# Patient Record
Sex: Male | Born: 1945 | Race: White | Hispanic: No | Marital: Married | Smoking: Former smoker
Health system: Southern US, Community
[De-identification: ages and names within clinical notes are randomized; demographics above are authoritative.]

## PROBLEM LIST (undated history)

## (undated) DIAGNOSIS — F419 Anxiety disorder, unspecified: Secondary | ICD-10-CM

## (undated) DIAGNOSIS — I2510 Atherosclerotic heart disease of native coronary artery without angina pectoris: Secondary | ICD-10-CM

## (undated) DIAGNOSIS — G8929 Other chronic pain: Secondary | ICD-10-CM

## (undated) DIAGNOSIS — I10 Essential (primary) hypertension: Secondary | ICD-10-CM

## (undated) DIAGNOSIS — G4730 Sleep apnea, unspecified: Secondary | ICD-10-CM

## (undated) DIAGNOSIS — I219 Acute myocardial infarction, unspecified: Secondary | ICD-10-CM

## (undated) DIAGNOSIS — M545 Low back pain, unspecified: Secondary | ICD-10-CM

## (undated) DIAGNOSIS — Z87442 Personal history of urinary calculi: Secondary | ICD-10-CM

## (undated) DIAGNOSIS — Z9981 Dependence on supplemental oxygen: Secondary | ICD-10-CM

## (undated) DIAGNOSIS — M170 Bilateral primary osteoarthritis of knee: Secondary | ICD-10-CM

## (undated) DIAGNOSIS — F418 Other specified anxiety disorders: Secondary | ICD-10-CM

## (undated) DIAGNOSIS — F32A Depression, unspecified: Secondary | ICD-10-CM

## (undated) DIAGNOSIS — I25810 Atherosclerosis of coronary artery bypass graft(s) without angina pectoris: Secondary | ICD-10-CM

## (undated) DIAGNOSIS — E7800 Pure hypercholesterolemia, unspecified: Secondary | ICD-10-CM

## (undated) DIAGNOSIS — F0390 Unspecified dementia without behavioral disturbance: Secondary | ICD-10-CM

## (undated) DIAGNOSIS — F329 Major depressive disorder, single episode, unspecified: Secondary | ICD-10-CM

## (undated) DIAGNOSIS — I639 Cerebral infarction, unspecified: Secondary | ICD-10-CM

## (undated) DIAGNOSIS — N189 Chronic kidney disease, unspecified: Secondary | ICD-10-CM

## (undated) HISTORY — PX: KIDNEY STONE SURGERY: SHX686

## (undated) HISTORY — DX: Atherosclerosis of coronary artery bypass graft(s) without angina pectoris: I25.810

## (undated) HISTORY — PX: WRIST SURGERY: SHX841

## (undated) HISTORY — PX: CAROTID ENDARTERECTOMY: SUR193

## (undated) HISTORY — PX: FRACTURE SURGERY: SHX138

## (undated) HISTORY — PX: CARDIAC CATHETERIZATION: SHX172

## (undated) HISTORY — DX: Cerebral infarction, unspecified: I63.9

---

## 1983-04-12 DIAGNOSIS — I219 Acute myocardial infarction, unspecified: Secondary | ICD-10-CM

## 1983-04-12 HISTORY — DX: Acute myocardial infarction, unspecified: I21.9

## 1994-04-11 HISTORY — PX: CORONARY ARTERY BYPASS GRAFT: SHX141

## 1997-11-22 ENCOUNTER — Inpatient Hospital Stay (HOSPITAL_COMMUNITY): Admission: EM | Admit: 1997-11-22 | Discharge: 1997-11-24 | Payer: Self-pay | Admitting: Emergency Medicine

## 1999-04-12 HISTORY — PX: WRIST FRACTURE SURGERY: SHX121

## 1999-11-10 HISTORY — PX: COLONOSCOPY: SHX174

## 1999-11-30 ENCOUNTER — Ambulatory Visit (HOSPITAL_COMMUNITY): Admission: RE | Admit: 1999-11-30 | Discharge: 1999-11-30 | Payer: Self-pay | Admitting: Gastroenterology

## 2000-04-14 ENCOUNTER — Encounter: Payer: Self-pay | Admitting: Specialist

## 2000-04-14 ENCOUNTER — Encounter: Payer: Self-pay | Admitting: Emergency Medicine

## 2000-04-14 ENCOUNTER — Emergency Department (HOSPITAL_COMMUNITY): Admission: EM | Admit: 2000-04-14 | Discharge: 2000-04-14 | Payer: Self-pay | Admitting: Emergency Medicine

## 2001-03-21 ENCOUNTER — Encounter: Admission: RE | Admit: 2001-03-21 | Discharge: 2001-06-19 | Payer: Self-pay | Admitting: *Deleted

## 2003-04-12 HISTORY — PX: AV FISTULA REPAIR: SHX563

## 2003-07-02 ENCOUNTER — Emergency Department (HOSPITAL_COMMUNITY): Admission: EM | Admit: 2003-07-02 | Discharge: 2003-07-02 | Payer: Self-pay | Admitting: Emergency Medicine

## 2004-03-26 ENCOUNTER — Inpatient Hospital Stay (HOSPITAL_COMMUNITY): Admission: EM | Admit: 2004-03-26 | Discharge: 2004-03-28 | Payer: Self-pay | Admitting: Emergency Medicine

## 2004-04-26 ENCOUNTER — Encounter (HOSPITAL_COMMUNITY): Admission: RE | Admit: 2004-04-26 | Discharge: 2004-07-25 | Payer: Self-pay | Admitting: Cardiology

## 2005-02-02 ENCOUNTER — Encounter: Admission: RE | Admit: 2005-02-02 | Discharge: 2005-02-02 | Payer: Self-pay | Admitting: Cardiology

## 2005-02-07 ENCOUNTER — Ambulatory Visit (HOSPITAL_COMMUNITY): Admission: RE | Admit: 2005-02-07 | Discharge: 2005-02-08 | Payer: Self-pay | Admitting: Cardiology

## 2005-03-15 ENCOUNTER — Ambulatory Visit: Payer: Self-pay | Admitting: Cardiology

## 2005-03-17 ENCOUNTER — Inpatient Hospital Stay (HOSPITAL_COMMUNITY): Admission: AD | Admit: 2005-03-17 | Discharge: 2005-03-18 | Payer: Self-pay | Admitting: Vascular Surgery

## 2005-09-13 ENCOUNTER — Encounter: Admission: RE | Admit: 2005-09-13 | Discharge: 2005-09-13 | Payer: Self-pay | Admitting: Orthopedic Surgery

## 2006-08-09 ENCOUNTER — Encounter: Admission: RE | Admit: 2006-08-09 | Discharge: 2006-08-09 | Payer: Self-pay | Admitting: Family Medicine

## 2007-06-08 ENCOUNTER — Encounter: Admission: RE | Admit: 2007-06-08 | Discharge: 2007-06-08 | Payer: Self-pay | Admitting: Cardiology

## 2007-06-13 ENCOUNTER — Ambulatory Visit (HOSPITAL_COMMUNITY): Admission: RE | Admit: 2007-06-13 | Discharge: 2007-06-13 | Payer: Self-pay | Admitting: Cardiology

## 2008-02-05 ENCOUNTER — Ambulatory Visit: Payer: Self-pay | Admitting: Surgery

## 2010-05-02 ENCOUNTER — Encounter: Payer: Self-pay | Admitting: Orthopedic Surgery

## 2010-08-24 NOTE — Cardiovascular Report (Signed)
[name], [name]                 ACCOUNT NO.:  [id number]   MEDICAL RECORD NO.:  [id number]          PATIENT TYPE:  OIB   LOCATION:  2899                         FACILITY:  [facility]   PHYSICIAN:  [name], [name] DATE OF BIRTH:  Jun 26, 1945   DATE OF PROCEDURE:  06/13/2007  DATE OF DISCHARGE:                            CARDIAC CATHETERIZATION   INDICATIONS FOR TEST:  This 65 year old male had bypass surgery in 1996.  His last cardiac catheterization in 2006 showed loss of the saphenous  vein graft to the OM.  He presented with atypical pain that sounded  somewhat anginal in nature.  Because of this, he is brought to the cath  lab for outpatient cardiac catheterization including graft  visualization.   PROCEDURE:  The patient was prepped and draped in the usual sterile  fashion exposing the right groin.  Following local anesthetic with 1%  Xylocaine, the Seldinger technique employed and a 5-French introducer  sheath was placed in the right femoral artery.  Left and right coronary  arteriography and graft visualization was performed.   COMPLICATIONS:  None.   TOTAL CONTRAST USED:  105 mL.   EQUIPMENT:  The OM graft was visualized with the left coronary bypass  catheter and the internal mammary artery is visualized with a Noto  catheter.   RESULTS:  1. Hemodynamic monitoring.  Central aortic pressure was 105/68.  Left      ventricular pressure 107/6.  There was no significant gradient at      the time of pullback.  2. Ventriculography.  Ventriculography in the RAO projection using 25      mL of contrast and in the LAO projection using 20 mL of contrast      was performed.  There was a diverticulum in the mid anterior wall      of the left ventricle.  The other segments all had normal LV      systolic function.  Ejection fraction approximately 50%.  The end-      diastolic pressure was 14.   CORONARY ARTERIOGRAPHY:  1. Left main normal to LAD 100% occluded proximally.  2.  Circumflex 100% occluded proximally.  3. Right coronary 100% occluded proximally.   GRAFTS:  1. Saphenous vein graft to the RCA 100% occluded at its ostium.  2. Saphenous vein graft to the diagonal 100% occluded at its ostium.  3. Saphenous vein graft to the OM.  This graft was dilated and      ectatic.  Although there was no high-grade stenoses, the graft      clearly shows its age.  There was TIMI III flow.  The OM itself      bifurcated and was free of significant disease.  There were      collaterals from the OM system to the PDA and posterior lateral      vessel.  4. Internal mammary artery to the LAD.  The internal mammary artery      was widely patent.  It inserted into the midportion of the LAD in      the mid and distal  vessel were free of disease.  There was      retrograde filling of the LAD and into the diagonal.  There was an      area of 70% narrowing retrograde up the LAD from the insertion site      of the internal mammary artery.  This area is not amenable to any      type of intervention.  The diagonal was visualized.  There was also      a large collateral bed noted to the PDA and posterior lateral      vessels.   CONCLUSION:  1. Loss of all native vessels.  2. Loss of 2/4 saphenous vein grafts with mild to moderate disease in      the vein graft to the OM system, large collateral beds to the PDA      and posterior lateral system, widely patent internal mammary artery      to the LAD with retrograde filling of the diagonal, but this vessel      is jeopardized because of 70% area in the LAD retrograde from the      insertion site of the graft.  This area is not amenable to any type      of intervention.  3. Left ventricular diverticulum.  4. Normal LV systolic function.   For now, I will try him on aggressive medical therapy.  If he continues  to have chest pain, will consider EECP.  At this point, I do not think  he is a candidate for repeat bypass  surgery.           ______________________________  George Solo Brooks, [name]     ABL/MEDQ  D:  06/13/2007  T:  06/13/2007  Job:  [id number]   cc:   [name], [name]  [facility]

## 2010-08-24 NOTE — Consult Note (Signed)
NEW PATIENT CONSULTATION   [name], [name]  DOB:  June 23, 1945                                        February 05, 2008  CHART #:  [id number]   REFERRING PHYSICIAN:  Thereasa Solo. Little, MD.   REASON FOR CONSULTATION:  Severe 3-vessel coronary artery disease and  the saphenous vein graft disease, status post coronary artery bypass  graft surgery in 1996.   CLINICAL HISTORY:  I was asked by Dr. Clarene Duke to evaluate the patient for  consideration of redo coronary artery bypass graft surgery.  He is a 65-  year-old gentleman who underwent coronary artery bypass graft surgery by  me in 1996.  He had 5 bypasses with a left internal mammary graft to the  LAD, saphenous vein graft to diagonal branch, a saphenous vein graft to  the obtuse marginal branch, and a sequential saphenous vein graft to the  acute marginal and distal right coronary artery.  He did well for  awhile, but then began having some chest pain, which was felt to be  angina.  He had a catheterization last done on June 13, 2007, which  showed occlusion of all of his native vessels.  There was patent left  internal mammary graft to the LAD.  There is a patent, but diffusely  diseased vein graft to the obtuse marginal.  There is no significant  stenosis in it.  The vein graft to the diagonal branch was occluded and  was old.  The vein graft to the right coronary territory was also  occluded.  There were faint collaterals from the LAD filling the  diagonal branch as well as the distal right coronary artery.  Left  ventricular ejection fraction of about 50%.  He is apparently evaluated  by Dr. Donata Clay at some point for consideration of redo bypass, but Dr.  Donata Clay.  I did not feel that surgery would be in his best interest.  He was treated with EECP and has also been on Ranexa.  He said that his  symptoms overall have been roughly about the same.  He does have some  exertional substernal chest tightness, but  this is not reliably  produced.  He may go several days without having any symptoms.  He also  notes that for the past few months, he has been having some substernal  chest pain when lying down at night.  He said this pain usually comes on  very suddenly where his exertionally related pain tends to come on  gradually.  He usually takes some sublingual nitroglycerin with relief  of his symptoms.  He also reports some exertional dyspnea as well as  fatigue, but these are unchanged.   REVIEW OF SYSTEMS:  General:  He denies fever or chills.  He has had  about 20 pounds of weight loss over the past 6 months, which he  attributes to watching his diet closely and reducing portion size.  His  wife is also trying to lose weight, so they have been buying high-  calorie food and eating less.  He denies any fever.  He has had fatigue.  Eyes:  Negative.  ENT:  Negative.  Endocrine:  He does have adult-onset  diabetes since 2002.  He denies hypothyroidism.  Respiratory:  He denies  cough and sputum production.  GI:  He  has had no nausea or vomiting.  He  denies melena and bright red blood per rectum.  He has had some  constipation.  GU:  He denies dysuria and hematuria.  He has had urinary  frequency.  Vascular:  He denies claudication and phlebitis.  Neurological:  He denies any focal weakness or numbness.  He denies  dizziness and syncope.  He has never had TIA or stroke.  Psychiatric:  Negative.  Hematological:  Negative.   ALLERGIES:  To penicillin.   PAST MEDICAL HISTORY:  Significant for adult-onset diabetes,  hypertension, and hyperlipidemia.  He has history of obstructive sleep  apnea, but cannot wear CPAP mask.  He has a history of coronary artery  disease as mentioned above.  He has a history of myocardial infarctions  in 1985, 1990, 1991, and 1996.  He is status post multiple PTCAs of the  right coronary artery and obtuse marginal coronary artery in 1990s.   FAMILY HISTORY:  Positive  for cardiac disease in his father.   SOCIAL HISTORY:  He is married and retired.  He quit smoking in 1985 and  he denies regular alcohol use.   MEDICATIONS:  Alprazolam 0.5 mg p.r.n., Budeprion ST 150 mg b.i.d.,  Crestor 20 mg daily, Cozaar 25 mg daily, isosorbide mononitrate 60 mg  daily, Janumet 50/1000 b.i.d., Lovaza b.i.d., Lopressor 25 mg daily,  Niaspan 1000 mg daily, sublingual nitroglycerin p.r.n., Plavix 75 mg  daily, Ranexa 1000 mg b.i.d., TriCor 145 mg daily, aspirin 81 mg daily,  multivitamin daily, vitamin D 2000 units daily, and MiraLax p.r.n.   PHYSICAL EXAMINATION:  GENERAL:  He is a mildly obese white male, in no  distress.  HEENT:  Shows be normocephalic and atraumatic.  Pupils are equal and  reactive to light and accommodation.  Extraocular muscles are intact.  He has bilateral hearing aids.  His throat is clear.  NECK:  Normal carotid pulses bilaterally.  There are no bruits.  There  is no adenopathy or thyromegaly.  CARDIAC:  Regular rate and rhythm with normal S1 and S2.  There is no  murmur, rub, or gallop.  LUNGS:  Clear.  There is a well-healed sternotomy incision.  ABDOMEN:  Active bowel sounds.  His abdomen is soft, obese, and  nontender.  There are no palpable masses or organomegaly.  EXTREMITIES:  No peripheral edema.  There is a saphenous vein harvest incision from  the right ankle up into the thigh.  Pedal pulses are palpable  bilaterally.  SKIN:  Warm and dry.  NEUROLOGIC:  Shows be alert and oriented x3.  Motor and sensory exams  are grossly normal.   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  He has a patent left internal  mammary graft to the left anterior descending supplying collaterals to  the diagonal branch and the distal right coronary artery and a disease,  but widely patent saphenous vein graft to  the obtuse marginal branch.  His symptoms do not sound like they are progressing significantly since  his catheterization.  His chest pain that develops after laying down, is  fairly atypical and may not be angina.  This was clearly different than  the chest pressure that he gets with exertion.  He said that he overall  feels fairly well, although he does notice that he cannot do as much  activity  as he would like.  I do not think that I would be able to  account on bypassing his diagonal coronary artery.  I redo surgery since  his left internal mammary pedicle will most likely be lying over that  vessel.  I also doubt that his right coronary artery would be graftable  since it was a poor quality diffusely diseased vessel at its original  surgery of relatively small size.  I do not think the left circumflex  vein graft should be replaced unless he develops significant stenosis in  it.  I think, the risk of surgery at this time would be very high and I  doubt that it would be of much benefit to him.  I do not think I would  be able to graft his diagonal or right coronary artery and he would  likely still have some areas that are somewhat ischemic and lying on  collateral flow.  I would recommend continued medical therapy and close  followup and if he does develop significant progression of exertional  related symptoms, then he should probably have another catheterization  to re-evaluate his left circumflex vein graft.  If this did develop  significant stenosis, then I think surgery maybe warranted and the  benefits may outweigh the risk.  I discussed all this with the patient  and his wife and they are in full agreement.   Evelene Croon, [name]  Electronically Signed   BB/MEDQ  D:  02/05/2008  T:  02/06/2008  Job:  [id number]   cc:   Thereasa Solo. Little, [name]  Talmadge Coventry, [name]

## 2010-08-27 NOTE — Discharge Summary (Signed)
NAMEBREYER, [name]                 ACCOUNT NO.:  [id number]   MEDICAL RECORD NO.:  [id number]          PATIENT TYPE:  INP   LOCATION:  3731                         FACILITY:  [facility]   PHYSICIAN:  Madaline Savage, M.D.DATE OF BIRTH:  [birth year]   DATE OF ADMISSION:  03/26/2004  DATE OF DISCHARGE:  03/28/2004                                 DISCHARGE SUMMARY   ADMISSION DIAGNOSES:  1.  Acute inferior myocardial infarction.  2.  Coronary artery disease status post coronary artery bypass grafting.  3.  Adult onset diabetes mellitus insulin-dependent.  4.  Hyperlipidemia.  5.  Morbid obesity.   DISCHARGE DIAGNOSES:  1.  Acute inferior myocardial infarction.  2.  Coronary artery disease status post coronary artery bypass grafting.  3.  Adult onset diabetes mellitus insulin-dependent.  4.  Hyperlipidemia.  5.  Morbid obesity.   PROCEDURES:  Cardiac catheterization March 26, 2004 with PTCA and  stenting of the saphenous vein graft to the OM with a Horizon Baer metal  VersaTack stent.   BRIEF HISTORY:  The patient is a 65 year old white male cardiology patient  of Dr. Caprice Kluver who underwent coronary artery bypass grafting in the 1990's  and had multiple prior MI's and multiple angioplasties by Dr. Clarene Duke.  He  also has a significant history of diabetes, obesity, and hyperlipidemia.  He  presented to the ER with 6/10 pain.  He was placed on IV heparin and given  oral nitroglycerin.  He was subsequently taken to the cath lab for cardiac  catheterization and possible intervention.   ALLERGIES:  Penicillin.   CURRENT MEDICATIONS:  1.  Avandia 4 mg daily.  2.  Toprol-XL 50 mg daily.  3.  Cozaar 100 mg daily.  4.  Zyrtec 10 mg h.s.  5.  Metformin 1 gram b.i.d. a.c.  6.  Advicor 500/20, 1 daily.  7.  Wellbutrin 150 mg b.i.d.   HOSPITAL COURSE:  The patient was taken to the cath lab and was found to  have an 80% stenosis of his saphenous vein graft to the OM.  He had severe 3  vessel disease.  The saphenous vein graft to the diagonal was patent.  RCA  was completed occluded.  His LIMA to the LAD was patent.  His EF was between  40 and 50%.  There was no MR and aneurysmal hypokinesis in the inferior  wall.  The patient tolerated the procedure well and returned to the ICU.  He  remained stable overnight.  His initial CK and troponin were negative but  subsequent studies showed that his troponin's were as high as 3.36 and his  CK went up to 300 with an MB of 49.6.  He was stable in the a.m. on March 27, 2004 and transferred to the floor.  He has been mobilized.  He has had  no further chest pain.  His EKG shows a sinus rhythm.  The initial EKG on  admission showed inferior MI but we now have older EKG's which are  consistent with prior inferior MI.  He was seen on March 28, 2004 by Dr.  Shirlee Latch and it was his impression that the patient was doing well.  He was  anxious for discharge and we plan to discharge him home today.   DISCHARGE MEDICATIONS:  1.  Avandia 4 mg daily.  2.  Toprol-XL 50 mg daily.  3.  Cozaar 100 mg daily.  4.  Zyrtec 10 mg h.s.  5.  Aspirin 81 mg daily.  6.  Advicor 500/20, 1 daily.  7.  Wellbutrin 150 mg b.i.d.  8.  Centrum Silver multivitamin 1 daily.  9.  Plavix 75 mg daily.  10. Glucophage 1 gram b.i.d. before breakfast and supper.  11. He was also given a prescription for nitroglycerin 1:150 SL p.r.n. q.5      minutes.  He is to call for 3 or more.   DISCHARGE ACTIVITIES:  Light for 1 week.  He may resume his normal activity.  He is to maintain a low fat diabetic diet.  He is to call if he has any  problems with the cath site.  The cath site looked good at the time of  discharge.  He will follow up with Dr. Clarene Duke in 2 weeks and our office will  arrange that.   LABORATORY DATA:  His labs show a hemoglobin of 13, hematocrit 37.8, white  count 8.5, platelets 295,000.  Sodium 134, potassium 4, chloride 26, CO2 23,  BUN 11,  creatinine 0.9, glucose 90.  Troponin's were 1.18, 2.37, 3.36, and  3.06 respectively.  Lipid profile is pending.   CONDITION ON DISCHARGE:  Home and improving.       WDJ/MEDQ  D:  03/28/2004  T:  03/29/2004  Job:  [id number]   cc:   Thereasa Solo. Little, [name]  [address]  [address]  [place]  [place] [address]  Fax: [phone]

## 2010-08-27 NOTE — Procedures (Signed)
[facility]  Patient:    [name], [name]                        MRN: [id number] Proc. Date: 11/30/99 Adm. Date:  [id number] Attending:  Deneen Harts CC:         Ammie Dalton, [name]   Procedure Report  PROCEDURE:  Colonoscopy.  INDICATIONS FOR PROCEDURE:  A 65 year old white male with new onset of hematochezia. No prior colorectal neoplasia surveillance. Bowel habits are regular. No family history of colorectal neoplasia.  DESCRIPTION OF PROCEDURE:  After reviewing the nature of the procedure with the patient including potential risks and complications, and after discussing alternative methods of diagnosis and treatment, informed consent was signed.  The patient was premedicated receiving IV sedation totaling Versed 7 mg, fentanyl 75 mcg administered IV in divided doses prior to and during the course of the procedure.  Using an Olympus CF-140L video colonoscope, the rectum was intubated after a normal digital examination revealing no evidence of perianal or intrarectal pathology other than external hemorrhoids. The scope was inserted and advanced around the entire length of the colon without difficulty. The cecum was identified by the appendiceal orifice and ileocecal valve. Visicol preparation was good with some retained debris throughout the colon. This was easily irrigated clear.  The scope was slowly withdrawn with careful inspection of the entire colon in a retrograde manner including retroflexed view in the rectal vault. No abnormality was noted. Specifically, there was no evidence of neoplasia, diverticular disease, mucosal inflammation or vascular abnormality. Aside from the external hemorrhoids, this was a perfectly normal colonoscopy.  The colon was decompressed, scope withdrawn. The patient tolerated the procedure with minimal discomfort and without complications. Returned to recovery in stable condition.  Time 1, technical 1,  preparation 2, total score equal 4.  ASSESSMENT:  1. External hemorrhoids--probable source of hematochezia.  2. Normal colonoscopy--no evidence of colorectal neoplasia.  RECOMMENDATIONS:  1. Yearly Hemoccult--per Dr. Theda Belfast.  2. Repeat colonoscopy in 10 years--per Dr. Kinnie Scales.  3. Anusol-HC suppository, dispensed ______ 1 PR b.i.d. x 5 days p.r.n.  DD:  11/30/99 TD:  12/01/99 Job: [id number]

## 2010-08-27 NOTE — H&P (Signed)
[name], [name]                 ACCOUNT NO.:  [id number]   MEDICAL RECORD NO.:  [id number]           PATIENT TYPE:   LOCATION:                                 FACILITY:   PHYSICIAN:  Di Kindle. Edilia Bo, M.D.DATE OF BIRTH:   DATE OF ADMISSION:  03/17/2005  DATE OF DISCHARGE:                                HISTORY & PHYSICAL   DATE OF PLANNED ADMISSION:  March 17, 2005   REASON FOR ADMISSION:  Right femoral A-V fistula.   HISTORY:  This is a pleasant 65 year old gentleman with a history of  significant coronary artery disease. He has apparently had six previous  myocardial infarctions according to his wife, most recently December2005. He  has had normal multiple previous heart catheterizations. His most recent  catheterization in the right groin was in December2005. He had been  evaluated by Dr. 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 Of note, the patient really has  had no symptoms associated with this. He has had some chronic bilateral  lower extremity swelling. He has had no claudication, rest pain, or  nonhealing wounds. He was sent for vascular evaluation given this right  femoral A-V fistula.   PAST MEDICAL HISTORY:  Significant for adult onset diabetes. He is not  insulin dependent. In addition, he has hypertension, hypercholesterolemia,  and significant coronary artery disease. He has had extensive previous  cardiac catheterizations and a recent echocardiogram shows an ejection  fraction of 45-50% with borderline global hypokinesis of the left ventricle.   FAMILY HISTORY:  There is no history of premature cardiovascular disease.   SOCIAL HISTORY:  He is married. He does not have any children. He quit  tobacco in 1985.   ALLERGIES:  PENICILLIN.   MEDICATIONS:  1.  Avandamet 2 mg/1000 mg p.o. b.i.d.  2.  Budeprion p.o. b.i.d.  3.  Plavix 75 mg p.o. daily.  4.  Hyzaar 100/25 p.o. daily.  5.  Isosorbide 60 mg p.o. daily.  6.  Lipitor 40 mg p.o. daily.  7.  Niaspan 1000 mg p.o. daily.  8.  Ranexa 500 mg p.o. b.i.d.  9.  Toprol-XL 50 mg p.o. daily.  10. Tricor 145 mg p.o. daily.  11. Zyrtec 10 milligrams p.o. daily.   REVIEW OF SYSTEMS:  GENERAL:  He has had no weight loss, weight gain or  problems with his appetite. He is 248 pounds, 5 feet 8 inches tall. CARDIAC:  He does have occasional chest pain. He has had previous open heart surgery.  He has had no recent palpitations or arrhythmias. PULMONARY:  He has had no  bronchitis, asthma, wheezing. GI:  He has had no recent change in his bowel  habits and has no history of peptic ulcer disease. He does have intermittent  problems with diarrhea and constipation. GU:  He has occasional frequency of  urination. VASCULAR:  He has had no  claudication, rest pain, or nonhealing  ulcers. He has had no history of stroke or TIAs. NEURO:  He has had no  dizziness, blackouts, headaches or seizures. ORTHO/SKIN:  He has had no  arthritis or joint pain. PSYCHIATRIC:  He does have a history of depression.  HEMATOLOGIC:  He has had no bleeding problems or clotting disorders.   PHYSICAL EXAMINATION:  VITAL SIGNS:  Blood pressure is 124/70, heart rate is  80. I do not detect any carotid bruits.  LUNGS:  Clear bilaterally to auscultation.  CARDIAC:  He has a regular rate and rhythm.  ABDOMEN:  Soft and nontender. I cannot palpate an aneurysm. He has palpable  femoral pulses and a right femoral bruit. He has moderate bilateral lower  extremity swelling. He has previously had vein taken from the right leg for  his open heart surgery. He has palpable pedal pulses bilaterally.   I did review his Doppler study which showed an ABI of 95% on the right and  94% on the left. A fistula was noted at the  bifurcation of the common  femoral artery associated with the common femoral vein.   IMPRESSION:  Given that this patient likely developed this fistula back in  December2005 which is when he had his most recent right femoral  catheterization, I have recommended elective repair of this fistula as it is  unlikely to seal on its own, given that it has been a year now. I think the  risks of not addressing this are that it could continue to enlarge and  potentially cause problems with venous hypertension, arterial insufficiency,  or congestive heart failure. I have explained that we will skeletonize the  artery and vein to help identify the fistula and that the biggest risk  associated with the operation is wound healing problems. We plan on  admitting him on March 17, 2005, and he will probably only require an  overnight stay. I will stop his Plavix for a few days preoperatively and put  him back on this postoperatively.      Di Kindle. Edilia Bo, [name]  Electronically Signed     CSD/MEDQ  D:  03/09/2005  T:  03/09/2005  Job:  [id number]

## 2010-08-27 NOTE — Op Note (Signed)
NAMEJAYDEN, [name]                 ACCOUNT NO.:  [id number]   MEDICAL RECORD NO.:  [id number]          PATIENT TYPE:  OBV   LOCATION:  2031                         FACILITY:  [facility]   PHYSICIAN:  Di Kindle. Edilia Bo, M.D.DATE OF BIRTH:  08/15/1945   DATE OF PROCEDURE:  03/17/2005  DATE OF DISCHARGE:                                 OPERATIVE REPORT   PREOPERATIVE DIAGNOSIS:  Right femoral arteriovenous fistula.   POSTOPERATIVE DIAGNOSIS:  Right femoral arteriovenous fistula.   PROCEDURE:  Repair of right femoral arteriovenous fistula.   SURGEON:  Di Kindle. Edilia Bo, [name]   ASSISTANT:  Coral Ceo, P.A.   ANESTHESIA:  General.   INDICATIONS:  There is a 65 year old gentleman who has had multiple previous  heart catheterizations, most recently in December 2005.  He was noted to  have an A-V fistula which has been persistent and given that he had this for  a year with no evidence of this resolving, we elected to proceed with the  repair.  We did discuss the procedure and potential complications including  the risk of wound-healing problems.   TECHNIQUE:  The patient was taken to the operating room and received a  general anesthetic.  The right groin and thigh were prepped and draped in  the usual sterile fashion.  His pannus had been taped superiorly.  A small  oblique incision was made beneath the inguinal crease and through this  incision, the common femoral artery, superficial femoral artery and deep  femoral artery were controlled with Vesseloops.  There was scar tissue  between the distal common femoral artery and the femoral vein.  This was  felt to be the location of the A-V fistula and this was confirmed with  Doppler.  Once the vessels were controlled, I dissected through this area  and the hole the artery was repaired with a 6-0 Prolene suture.  The venous  hole was closed with a 6-0 Prolene suture.  I skeletonized the common  femoral artery and superficial  femoral artery and proximal deep femoral  artery to be sure there were no other connections.  There was a normal  venous signal at the completion in the femoral vein.  Hemostasis was  obtained in the wound.  The wound was closed with a deep layer of 3-0  Vicryl, the subcutaneous layer with 3-0 Vicryl and the skin closed with a 4-  0 subcuticular stitch.  A sterile dressing was applied.  The patient  tolerated the procedure well and was transferred to the recovery room in  satisfactory condition.  All needle and sponge counts were correct.      Di Kindle. Edilia Bo, [name]  Electronically Signed     CSD/MEDQ  D:  03/17/2005  T:  03/18/2005  Job:  [id number]

## 2010-08-27 NOTE — Cardiovascular Report (Signed)
NAMEANTHONY, [name]                 ACCOUNT NO.:  [id number]   MEDICAL RECORD NO.:  [id number]          PATIENT TYPE:  INP   LOCATION:  1824                         FACILITY:  [facility]   PHYSICIAN:  Madaline Savage, M.D.DATE OF BIRTH:  [birth year]   DATE OF PROCEDURE:  03/26/2004  DATE OF DISCHARGE:                              CARDIAC CATHETERIZATION   PROCEDURES PERFORMED:  1.  Selective coronary angiography by Judkins technique.  2.  Retrograde left heart catheterization.  3.  Left ventricular angiography.  4.  Graft study of both left internal mammary artery and saphenous vein      grafts times five.  Percutaneous coronary intervention to the proximal      saphenous vein graft to obtuse marginal branch No. 1.  5.  Intravascular ultrasound post stenting as part of HORIZONS trial      protocol.   COMPLICATIONS:  None.   ENTRY SITE:  Right femoral.   DYE USED:  Omnipaque.   PATIENT PROFILE:  The patient is a 65 year old white, married male who is a  cardiology patient of Dr. Caprice Kluver who underwent coronary artery bypass  grafting in 1996 by Dr. Laneta Simmers.  At that time an internal mammary artery  graft was placed to the LAD, a saphenous vein graft to the diagonal, a  saphenous vein graft to obtuse marginal branch No. 1 and a sequential vein  graft to the right coronary artery branches, including acute marginal and  PDA.  The patient's presentation to the emergency room was caused by chest  pain that occurred early in the day that the patient remained very still and  quiet until it went away and then this evening, approximately an hour or so  before admission, he had more chest pain and this time decided to come to  the emergency room.  Dr. Cathren Laine, emergency department physician, saw  him and saw ST segment elevation of a mild degree in the inferior leads and  I came to the emergency room to see the patient.  At that time, the first  point of service markers were negative,  but the patient continued to have  low grade discomfort.  We therefore brought the patient to the  catheterization lab urgently and the patient fulfilled protocol criteria for  the HORIZONS protocol by [employer].  Trecia Rogers, R.N. was the nurse  coordinator for the study.  The patient underwent diagnostic catheterization  followed by percutaneous intervention of his proximal saphenous vein graft  to OM-1 and then intravascular ultrasound.  No complications occurred.   During the procedure, the patient was randomized to Integrilin plus heparin  and then was further randomized to a bare-metal stent versus TAXUS.  The  patient has five chances of six of having received a TAXUS stent and one  chance out of six of having received a bare-metal stent.   RESULTS:   PRESSURES:  1.  Left ventricular pressure was 140/13 and diastolic pressure 22.  2.  Central aortic pressure 140/72.  3.  Mean of 103.  4.  No aortic valve gradient by pullback  technique.   ANGIOGRAPHIC RESULTS:  The patient's left main coronary artery was medium in  length, medium in diameter and was normal.  The LAD was 100% occluded in  it's proximal portion with small caliber to the portion of the LAD seen.  There appeared to be an occluded diagonal as well.   The circumflex contained a 99% stenosis proximally, but the distal  circumflex filled all the way out to the distal atrioventricular groove and  there collateralized the distal RCA.  There were also two large atrial  circumflex branches noted.  The obtuse marginal branch of this circumflex  was occluded proximally.   The right coronary artery was occluded at it's origin and was difficult to  intubate.   The saphenous vein graft to RCA, which included an acute marginal branch and  a posterior descending branch, was 100% occluded proximally with a sac-like  area of occlusion and a small aneurysm.  There was no distal flow into the  RCA graft.   A saphenous  vein graft to the diagonal showed lumpy-bumpy irregularities,  but no significant stenosis throughout and there was very good filling of  both the diagonal and retrograde filling into the LAD and septals.   The saphenous vein graft to obtuse marginal branch No. 1 showed a beautiful  distal anastomotic site with fairly good distal runoff into the vessel, TIMI  class 3, but there was noted to be proximal segmental stenosis of  approximately 18 to 20 mm just after the ostium of the vessel.  This was a  gradually tapering stenosis down to about 80%.   The internal mammary artery graft to LAD was patent throughout.  Distal  anastomotic site was good.  There was distal flow into the LAD and some  backflow into the LAD and septals.  The distal LAD was a little small in  caliber, but was well filled.   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  The distal inferior wall  was hypokinetic, but not aneurysmal and the apex and anterolateral wall were  good to actually vigorous in contractility.  I would estimate ejection  fraction of 45%.  No mitral regurgitation was seen.  No obvious LV thrombus  was noted.   I deferred on doing an abdominal aortogram in view of anticipated need to  intervene because of dye load in this diabetic patient.   The intervention then proceeded with changing out of the 6-French sheath to  a 7-French sheath after the 6-French guide catheter had trouble staying  seated in the vessel.  I therefore used a left coronary bypass 7-French  guide catheter.  I used a filter wire for anticipated retrieval of embolic  material and I placed the filter wire in a segment of straight vein graft  and showed in two views that it was well seated in the vessel.  I then  placed and ultimately deployed a 4.0 x 24 mm HORIZONS protocol stent near the ostium of the vein graft, but covering the entire stenotic area  with  approximately 3 to 4 mm to spare on the distal end of the stent.  Deployment  was achieved with two inflations; the first to 15 atmospheres of stent  balloon pressure; the second was a repositioning of the balloon slightly to  try to flare the proximal end of the stent and I deployed that balloon to 17  atmospheres of pressure.  I then retrieved the filter wire inside the sheath and noted a mild amount  of grumous material.  I then took angiographic projections in two views of  the vessel, which appeared to be beautifully expanded and subsequently  IVUSed the vessel and found the distal reference vessel to be somewhere  between 4.25 and 4.5.  I found the stent well deployed in all segments with  some calcification peripherally.   No complications occurred during the procedure and Integrilin and heparin  were used.  The ACT was 299 during the procedure.  Pain occurred during  balloon inflations and went away following balloon inflations and was  somewhat similar to the patient's chest pain on the day of hospital entry.   No complications occurred.   FINAL DIAGNOSES:  1.  Acute coronary syndrome with ST segment elevations compatible with      inferior wall myocardial infarction.  2.  Status post coronary artery bypass grafting in 1996.  3.  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      1.  100% proximal left anterior descending artery occlusion.      2.  99% stenosis proximal circumflex.      3.  100% occlusion of the ostial right coronary artery.  4.  Status post coronary artery bypass grafting.      1.  Patent internal mammary artery graft to left anterior descending          artery.      2.  Patent saphenous vein to diagonal.      3.  100% occluded right coronary artery graft with small cul-de-sac at          the proximal anastomotic site, but no distal flow into the vessel.      4.  80% stenosis of proximal saphenous vein graft to obtuse marginal.   5.  Successful stent deployment with HORIZONS study stent into proximal      saphenous vein graft to obtuse marginal with reduction of an 80% lesion      to 0% residual.   The patient will be transferred to the CCU, kept on Integrilin for 18 hours.  He received 300 mg of Plavix in the catheterization lab and will receive 300  mg of Plavix with his next meal.  He will be given proton pump inhibitor as  well.  The patient will have a study protocol repeat catheterization in 13  months.       WHG/MEDQ  D:  03/26/2004  T:  03/26/2004  Job:  [id number]   cc:   Thereasa Solo. Little, [name]  [address]  [address]  [place]  [place] [address]  Fax: [phone]   [facility]  [facility] [place], [place].  [employer]   [facility]

## 2010-08-27 NOTE — Cardiovascular Report (Signed)
NAMEKEIDAN, AUMILLER                 ACCOUNT NO.:  [id number]   MEDICAL RECORD NO.:  [id number]          PATIENT TYPE:  OIB   LOCATION:  4731                         FACILITY:  [facility]   PHYSICIAN:  Madaline Savage, M.D.DATE OF BIRTH:  [birth year]   DATE OF PROCEDURE:  02/07/2005  DATE OF DISCHARGE:                              CARDIAC CATHETERIZATION   PROCEDURES PERFORMED:  1.  Combined left heart catheterization with left ventricular angiography,      retrograde left heart catheterization, and selective native coronary      angiography.  2.  Left internal mammary artery graft angiography.  3.  Multiple saphenous vein graft angiography.  4.  Intravascular ultrasound of the saphenous vein graft to left circumflex,      obtuse marginal branch. No percutaneous intervention performed today      (this patient was a member of the [employer] study protocol by [employer]).   HISTORY:  The patient is now a 65 year old gentleman who is a cardiology  patient of Dr. Clarene Duke and a medical patient Dr. Talmadge Coventry. He  underwent coronary artery bypass grafting by Dr. Laneta Simmers in 1996 and had  documented need for stenting of an obtuse marginal branch #1 of the  circumflex last year March 27, 1999. He received a Horizon study stent at  that time, and because of recent chest pains, allowances were made for him  to come back and have his one-year research catheterization a little early.  That procedure was performed on today February 07, 2005. During the course of  that procedure, he received Omnipaque by the left and catheters by left  percutaneous femoral artery approach and the findings are summarized below  briefly and the reader is referred to the previous catheterization for  comparison. Native coronary artery disease unchanged since March 26, 2004.   1.  LAD proximally occluded 100%.  2.  Circumflex 99% proximally occluded retrograde collateral flow to right.  3.   Left circumflex OM #1 100% proximally occluded.  4.  Right coronary artery 100% ostial occlusion with no antegrade flow.  5.  Large status post CABG.   1.  Saphenous vein graft RCA occluded at the ostium and the proximal      anastomotic site. No interval change since last cath.  2.  Saphenous vein graft to diagonal patent.  3.  Saphenous vein graft obtuse marginal #1, patent and proximal Horizon      study stent is patent as well.  4.  Left internal mammary artery graft to LAD widely patent.  5.  Left ventricular angiography:  LVEF 40-50%. There is a focal infarct      along the mid-inferior wall.  6.  Intravascular ultrasound of the saphenous vein graft obtuse marginal      branch #1 was performed with an ACT in excess of 230 seconds. The      Atlantis catheter was used a Research scientist (physical sciences) was used. The vessel was IVUS      as a reference vessel distally and found to be approximately 4.5 mm  in      diameter with some scattered calcific plaquing and luminal irregularity      but a well-preserved central lumen. We pulled back into the stent and      noted that the stent edges were opposed to the lumen of the vessel with      no gaping. There was peripheral calcification noted. The entirety of the      stented portion of the vessel was well opposed and no late loss of      internal lumen. Internal lumen varied between 4.0-4.5. The stent and did      before the ostium of the saphenous vein graft and that ostium appeared      patent.   FINAL DIAGNOSIS:  There is patency of the saphenous vein graft to obtuse  marginal-1 as well as the stent it contained. There are no new findings from  this cath compared to the one done one year ago.           ______________________________  Madaline Savage, [name]     WHG/MEDQ  D:  02/07/2005  T:  02/07/2005  Job:  [id number]   cc:   Thereasa Solo. Little, [name]  Fax: [phone]   Talmadge Coventry, [name]  Fax: [phone]   Claire Shown  [employer] [facility]   [facility]   Evelene Croon, [name]  [address]  [place]  [place] [address]

## 2011-01-21 ENCOUNTER — Other Ambulatory Visit: Payer: Self-pay | Admitting: Cardiology

## 2011-01-21 ENCOUNTER — Ambulatory Visit
Admission: RE | Admit: 2011-01-21 | Discharge: 2011-01-21 | Disposition: A | Discharge status: Home / Self Care | Payer: Medicare Other | Source: Ambulatory Visit | Attending: Cardiology | Admitting: Cardiology

## 2011-01-21 DIAGNOSIS — R0602 Shortness of breath: Secondary | ICD-10-CM

## 2011-01-26 ENCOUNTER — Ambulatory Visit (HOSPITAL_COMMUNITY)
Admission: RE | Admit: 2011-01-26 | Discharge: 2011-01-26 | Disposition: A | Discharge status: Home / Self Care | Payer: Medicare Other | Source: Ambulatory Visit | Attending: Cardiology | Admitting: Cardiology

## 2011-01-26 DIAGNOSIS — I2510 Atherosclerotic heart disease of native coronary artery without angina pectoris: Secondary | ICD-10-CM | POA: Insufficient documentation

## 2011-01-26 DIAGNOSIS — Z01810 Encounter for preprocedural cardiovascular examination: Secondary | ICD-10-CM | POA: Insufficient documentation

## 2011-01-26 DIAGNOSIS — I200 Unstable angina: Secondary | ICD-10-CM | POA: Insufficient documentation

## 2011-01-26 DIAGNOSIS — I25810 Atherosclerosis of coronary artery bypass graft(s) without angina pectoris: Secondary | ICD-10-CM | POA: Insufficient documentation

## 2011-01-26 DIAGNOSIS — Z01818 Encounter for other preprocedural examination: Secondary | ICD-10-CM | POA: Insufficient documentation

## 2011-01-26 DIAGNOSIS — I2582 Chronic total occlusion of coronary artery: Secondary | ICD-10-CM | POA: Insufficient documentation

## 2011-01-26 LAB — GLUCOSE, CAPILLARY
Glucose-Capillary: 129 mg/dL — ABNORMAL HIGH (ref 70–99)
Glucose-Capillary: 118 mg/dL — ABNORMAL HIGH (ref 70–99)

## 2011-02-02 NOTE — Cardiovascular Report (Signed)
NAMECLEAVON, Brooks                 ACCOUNT NO.:  [id number]  MEDICAL RECORD NO.:  [id number]  LOCATION:  [facility]                         FACILITY:  [facility]  PHYSICIAN:  [name], [name] DATE OF BIRTH:  14-Jul-1945  DATE OF PROCEDURE:  01/26/2011 DATE OF DISCHARGE:                           CARDIAC CATHETERIZATION   INDICATION FOR TEST:  This 65 year old male had bypass surgery in 1996 and has had progressive angina.  He was cathed in 2009 that showed loss of 2 of the 4 bypass grafts.  He had an exacerbation of his chest pain starting in August, this progressively worsened.  He is now having 2-3 episodes, nitroglycerin responsive pain everyday but then can go 2 or 3 days without pain.  This occurs both with rest and with activity.  His EKG shows no change.  He was brought in for outpatient cardiac catheterization.  He also is diabetic.  After obtaining informed consent, the patient was prepped and draped in the usual sterile manner exposing the right groin.  Following local anesthetic with 1% Xylocaine, the Seldinger technique was employed using a Smart needle and a 5-French Introducer sheath was placed in the right femoral artery, graft visualization times 4 including internal mammary artery and 3 saphenous vein graft, visualization of his native vessels and ventriculogram was performed.  On previous cath his right coronary was 100% occluded.  COMPLICATIONS:  None.  TOTAL CONTRAST:  90 mL.  EQUIPMENT:  5-French Judkins configuration catheters were used for the left circulation.  _____ was used for the LIMA and LCV was required for the vein graft to the OM.  The other grafts adequately evaluated with no code catheter.  RESULTS: 1. Hemodynamic monitoring central aortic pressure was 102/58.  Left     ventricular pressure was 103/0 with no aortic valve gradient noted     at time of pullback. 2. Ventriculography:  Ventriculography using 25 mL of contrast at 12     mL/second  revealed the left ventricular cavity to be slightly     dilated.  The posterior basilar segment, the distal inferior wall     extending all way to the apex was severely hypokinetic.  The     anterior wall showed normal contractility.  The ejection fraction     was around 40% and end-diastolic pressure was only 9, no     significant mitral regurgitation was appreciated. 3. Coronary arteriography.  The right coronary was 100% occluded in     its proximal portion on previous cath and was unable to be     cannulated at this catheterization. 4. Left main.  The left main was well visualized, the circumflex was     100% occluded at the left main.  There was a small optional     diagonal vessel and a few branches and then the LAD was 100%     occluded almost at the ostium. 5. Grafts: The internal mammary artery extended down to the mid LAD     and internal mammary artery was large and widely patent.  The mid     and distal LAD appeared to have good flow with no evidence of  significant obstruction, there was retrograde filling up the LAD    where it became 100% occluded very proximally.  There was an area     of 60-70% narrowing slightly upstream from the insertion site of     the internal mammary artery to the LAD.  There was no way to     approach this percutaneously passed, this was reflux of the     contrast into what appeared to be a 1st diagonal branch that was     underfilled. 6. Vein grafts: Saphenous vein graft to the diagonal 100% occluded at     the ostium. 7. Saphenous vein graft to the RCA 100% occluded. 8. Saphenous vein graft to the OM.  This graft was patent and wide     open and showed a valve in the midportion of the graft with some     mild poststenotic dilatation just distal to the graft.  The OM     system was actually well visualized gave off 3 bifurcating branches     all of which were free of diseases and with retrograde filling into     a smaller more proximal OM  system.  There were left-to-right     collaterals. 9. Both the LAD system showed left to right collaterals as did the OM     system.  CONCLUSION: 1. Moderate left ventricular dysfunction with a 40% ejection fraction     and an end-diastolic pressure of only 9 with inferior and apical     wall motion abnormalities (slightly worse than in 2009). 2. Loss of 2/4 grafts with the internal mammary artery to the LAD     being widely patent and the vein graft to the OM system being     widely patent, with collaterals from both the LAD and OM system     supplying the mid distal right.  DISCUSSION:  At this point, there is nothing needs percutaneous intervention.  The vein graft to the OM system which was not biggest concern, it is still widely patent.  He will be managed on medical therapy.  He is already on beta-blockers and Ranexa and ACE inhibitor, aspirin and Plavix.          ______________________________ [name], [name]     ABL/MEDQ  D:  01/26/2011  T:  01/26/2011  Job:  [id number]  Electronically Signed by [name] [name] on 02/02/2011 11:48:38 AM

## 2011-02-02 NOTE — H&P (Signed)
[name], PICKUP                 ACCOUNT NO.:  [id number]  MEDICAL RECORD NO.:  [id number]  LOCATION:  [facility]                         FACILITY:  [facility]  PHYSICIAN:  Thereasa Solo. Little, [name] DATE OF BIRTH:  08-24-1945  DATE OF ADMISSION:  01/26/2011 DATE OF DISCHARGE:                             HISTORY & PHYSICAL   CHIEF COMPLAINT:  Angina, unstable.  HISTORY OF PRESENT ILLNESS:  This is a 65 year old white married male with history of coronary disease including bypass graft in 1996 with graft dysfunction, loss of 2 out of 4 grafts on catheterization in 2009. His LIMA to the LAD was patent, and it gave collaterals to the distal right.  The vein graft to the OM which was dilated and ectatic but had no high-grade stenosis with 2 patent vessels.  Over the last several months, the patient has had increasing angina, sometimes 2 to 3 episodes in a day.  He takes nitroglycerin for relief of these episode.  He has had similar problems with stable angina for several years, but now they have progressed and additionally has had decreased stamina and increased fatigue, any activity causes chest pain.  He saw Dr. Clarene Duke back in August and plans were for cardiac catheterization.  Dr. Clarene Duke was in concern that he mainly has small vessel disease, and there may be nothing from a percutaneous view that would be able to be done or he may need high-risk PCI.  It is quite concerning as she is on multiple medications now for angina.  He is here today pain-free, ready to proceed with elective cardiac catheterization.  PAST MEDICAL HISTORY:  Coronary disease as described.  He also has hypertension, diabetes mellitus, hyperlipidemia, and obstructive sleep apnea, has been unable to tolerate CPAP.  OUTPATIENT MEDICATIONS: 1. Vitamin D3 1000 units 1 daily. 2. Multivitamin 1 daily. 3. MiraLax 17 g daily. 4. Colace 100 mg 1 daily. 5. Aspirin 81 mg daily. 6. Simvastatin 20 mg 1 daily. 7. Ranexa 1000 mg 1  twice a day. 8. Plavix 75 mg daily. 9. Niaspan 500 mg 3 tablets daily. 10.Metoprolol tartrate 50 mg half a tablet daily. 11.Metformin 1000 mg 1 tablet twice daily. 12.Losartan 50 mg half a tablet daily. 13.Isosorbide mononitrate 60 mg 1 tab daily. 14.Fenofibrate 160 mg 1 tab daily. 15.Alprazolam 0.5 mg 1 tab daily as needed.  ALLERGIES:  PENICILLIN.  SOCIAL HISTORY:  Retired.  Married.  No children.  Seldom drinks alcohol.  No tobacco use.  He is unable to exercise due to his angina.  FAMILY HISTORY:  Not significant to this admission.  REVIEW OF SYSTEMS:  GENERAL:  No colds or fevers.  CARDIOVASCULAR: Angina as stated.  MUSCULOSKELETAL:  When he wakes in the morning, he has bilateral leg cramps, but once he is up walking, the cramps resolved.  GI: No diarrhea, constipation, or melena.  No indigestion. GU: No hematuria or dysuria.  NEUROLOGIC:  He may get somewhat orthostatic if he bends over and stands back up quickly.  SKIN:  Without rashes or ulcers.  ENDOCRINE: He states his glucose has been stable, followed by Dr. Ivory Broad.  PHYSICAL EXAM:  VITAL SIGNS:  Blood pressure 119/72, pulse 79,  respiratory rate 18, temp 97.1, oxygen saturation 94%. GENERAL:  Alert, oriented, white male, somewhat hard of hearing, pleasant affect, in no acute distress. HEENT:  Normocephalic, sclera clear. NECK:  Supple.  No JVD.  No bruits. LUNGS:  Clear without rales, rhonchi, or wheezes. HEART:  Sounds S1, S2.  Regular rate and rhythm without murmur, gallop, rub, or click. ABDOMEN:  Obese, soft, nontender.  Positive bowel sounds.  Do not palpate liver, spleen, or masses. EXTREMITIES:  Lower extremities, no edema this morning.  Right pedal pulse 2+, left pedal 1+.  Please note, the patient does wear hearing aids.  HOSPITAL COURSE:  EKG in the office was sinus rhythm with interventricular conduction delay and nonspecific lateral changes.  IMPRESSION: 1. Unstable angina. 2. Significant coronary  artery disease with history of bypass grafting     in 1996 and graft dysfunction with loss of 2 out of the 4 grafts on     his last heart catheterization in 2009, now with increased angina. 3. Hypertension, controlled. 4. Diabetes mellitus type 2, controlled. 5. Hyperlipidemia.  PLAN:  Cardiac catheterization today and proceeded with PCI if possible. Discussion will be made once the cardiac catheterization was complete.  Please note also last 2-D echo in 2006, EF was 45% to 50%, and on cath in 2009, EF with normal LV systolic function.    [name]. [name], N.P.   ______________________________ [name], [name]   LRI/MEDQ  D:  01/26/2011  T:  01/26/2011  Job:  [id number]  cc:   Nolon Nations, MD  Electronically Signed by Nada Boozer N.P. on 01/27/2011 01:24:34 PM Electronically Signed by Julieanne Manson [name] on 02/02/2011 11:48:28 AM

## 2011-03-14 ENCOUNTER — Emergency Department (INDEPENDENT_AMBULATORY_CARE_PROVIDER_SITE_OTHER)
Admission: EM | Admit: 2011-03-14 | Discharge: 2011-03-14 | Disposition: A | Discharge status: Home / Self Care | Payer: Medicare Other | Source: Home / Self Care | Attending: Family Medicine | Admitting: Family Medicine

## 2011-03-14 ENCOUNTER — Encounter: Payer: Self-pay | Admitting: *Deleted

## 2011-03-14 DIAGNOSIS — H6120 Impacted cerumen, unspecified ear: Secondary | ICD-10-CM

## 2011-03-14 HISTORY — DX: Essential (primary) hypertension: I10

## 2011-03-14 HISTORY — DX: Acute myocardial infarction, unspecified: I21.9

## 2011-03-14 HISTORY — DX: Atherosclerotic heart disease of native coronary artery without angina pectoris: I25.10

## 2011-03-14 NOTE — ED Provider Notes (Signed)
History     CSN: [id number] Arrival date & time: 03/14/2011  8:51 PM   First MD Initiated Contact with Patient 03/14/11 1932      Chief Complaint  Patient presents with  . Otalgia    (Consider location/radiation/quality/duration/timing/severity/associated sxs/prior treatment) Patient is a 65 y.o. male presenting with ear pain. The history is provided by the patient and the spouse.  Otalgia This is a new problem. The current episode started 2 days ago. There is pain in the right ear. The problem occurs constantly. The problem has not changed since onset.There has been no fever. The pain is mild. Associated symptoms include hearing loss and sore throat. Pertinent negatives include no ear discharge and no rhinorrhea. His past medical history is significant for hearing loss.    Past Medical History  Diagnosis Date  . MI (myocardial infarction)     (several)  . Coronary artery disease   . Hypertension   . Diabetes mellitus   . Kidney stone   . Hearing loss     wears bilateral hearing aids    Past Surgical History  Procedure Date  . Kidney stone surgery   . Coronary artery bypass graft     5 vessels    No family history on file.  History  Substance Use Topics  . Smoking status: Not on file  . Smokeless tobacco: Not on file  . Alcohol Use: No      Review of Systems  Constitutional: Negative.   HENT: Positive for hearing loss, ear pain and sore throat. Negative for rhinorrhea and ear discharge.     Allergies  Penicillins  Home Medications   Current Outpatient Rx  Name Route Sig Dispense Refill  . ALPRAZOLAM 0.5 MG PO TABS Oral Take 0.5 mg by mouth at bedtime as needed.      Marland Kitchen PLAVIX PO Oral Take by mouth daily.      . FENOFIBRATE PO Oral Take by mouth daily.      . ISOSORBIDE DINITRATE PO Oral Take by mouth.      Marland Kitchen LOSARTAN POTASSIUM PO Oral Take by mouth daily.      Marland Kitchen METFORMIN HCL 1000 MG PO TABS Oral Take 1,000 mg by mouth 2 (two) times daily with a meal.       . METOPROLOL TARTRATE 25 MG PO TABS Oral Take 25 mg by mouth daily.      Marland Kitchen NIACIN (ANTIHYPERLIPIDEMIC) 500 MG PO TBCR Oral Take 1,500 mg by mouth at bedtime.      Marland Kitchen RANOLAZINE 1000 MG PO TB12 Oral Take 500 mg by mouth 2 (two) times daily.      Marland Kitchen SIMVASTATIN 20 MG PO TABS Oral Take 20 mg by mouth at bedtime.        BP 146/78  Pulse 95  Temp(Src) 98.4 F (36.9 C) (Oral)  Resp 20  SpO2 95%  Physical Exam  Nursing note and vitals reviewed. Constitutional: He appears well-developed and well-nourished.  HENT:  Head: Normocephalic.  Left Ear: External ear normal.  Mouth/Throat: Oropharynx is clear and moist.  Eyes: Conjunctivae and EOM are normal. Pupils are equal, round, and reactive to light.    ED Course  Procedures (including critical care time)  Labs Reviewed - No data to display No results found.   No diagnosis found.    MDM  Sx relieved after irrigation.        Barkley Bruns, MD 03/14/11 2147

## 2011-03-14 NOTE — ED Notes (Signed)
C/O right ear pain and feeling "like it's stopped up" x 2 days.  Has appt w/ new ENT (Dr. Melvenia Beam) on Wed, but "I couldn't wait that long".  Has been unable to wear hearing aid in right ear.  Denies any cold sxs.

## 2012-05-29 ENCOUNTER — Other Ambulatory Visit (HOSPITAL_COMMUNITY): Payer: Self-pay | Admitting: Cardiology

## 2012-05-29 DIAGNOSIS — R0602 Shortness of breath: Secondary | ICD-10-CM

## 2012-05-29 DIAGNOSIS — R079 Chest pain, unspecified: Secondary | ICD-10-CM

## 2012-05-31 ENCOUNTER — Ambulatory Visit (HOSPITAL_COMMUNITY)
Admission: RE | Admit: 2012-05-31 | Discharge: 2012-05-31 | Disposition: A | Discharge status: Home / Self Care | Payer: Medicare Other | Source: Ambulatory Visit | Attending: Cardiovascular Disease | Admitting: Cardiovascular Disease

## 2012-05-31 DIAGNOSIS — R079 Chest pain, unspecified: Secondary | ICD-10-CM

## 2012-05-31 DIAGNOSIS — R0609 Other forms of dyspnea: Secondary | ICD-10-CM | POA: Insufficient documentation

## 2012-05-31 DIAGNOSIS — I739 Peripheral vascular disease, unspecified: Secondary | ICD-10-CM | POA: Insufficient documentation

## 2012-05-31 DIAGNOSIS — I369 Nonrheumatic tricuspid valve disorder, unspecified: Secondary | ICD-10-CM | POA: Insufficient documentation

## 2012-05-31 DIAGNOSIS — R0989 Other specified symptoms and signs involving the circulatory and respiratory systems: Secondary | ICD-10-CM | POA: Insufficient documentation

## 2012-05-31 DIAGNOSIS — I059 Rheumatic mitral valve disease, unspecified: Secondary | ICD-10-CM | POA: Insufficient documentation

## 2012-05-31 DIAGNOSIS — R0602 Shortness of breath: Secondary | ICD-10-CM

## 2012-05-31 NOTE — Progress Notes (Signed)
2D Echo Performed 05/31/2012    Luba Matzen, RCS  

## 2012-10-08 ENCOUNTER — Telehealth: Payer: Self-pay | Admitting: Cardiovascular Disease

## 2012-10-08 NOTE — Telephone Encounter (Signed)
Mailed info requested

## 2013-01-21 ENCOUNTER — Telehealth: Payer: Self-pay | Admitting: Cardiovascular Disease

## 2013-01-21 NOTE — Telephone Encounter (Signed)
Left message call returned. Call back if assistance still needed. 

## 2013-01-21 NOTE — Telephone Encounter (Signed)
Pt is being seen by a cardiologist in [place], but wants to also see Dr. Allyson Sabal. He would like to speak to someone regarding this matter.

## 2013-02-01 ENCOUNTER — Encounter: Payer: Self-pay | Admitting: Cardiovascular Disease

## 2013-02-07 ENCOUNTER — Telehealth: Payer: Self-pay | Admitting: *Deleted

## 2013-02-07 NOTE — Telephone Encounter (Signed)
Message forwarded to Kathryn, RN.  

## 2013-02-07 NOTE — Telephone Encounter (Signed)
Pt's wife called me back about her husbands recall appointment and was angry bc he was not taken off the list to see laura and it concerns her because she feels that we have issues in this office that needs to be addressed. I took him off the recall list for Vernona Rieger she wants him to stay on the list for Dr. Allyson Sabal for April. He has another cardiologist in [place] where he lives part of the time and she stated that she needs to know if we received those records from that doctor in [place]. She was a little upset and didn't understand why he was still on a recall when she stated he clearly was supposed to be taken off this list and it was not done and that is our issues.

## 2013-02-08 NOTE — Telephone Encounter (Signed)
I lm that the recall was placed when Mr [name] say JB earlier this year.  If he doesn't want to see Vernona Rieger, then please communicate that to JB during the office visit when it is brought up.

## 2013-02-11 ENCOUNTER — Telehealth: Payer: Self-pay | Admitting: Cardiovascular Disease

## 2013-04-11 HISTORY — PX: CAROTID ENDARTERECTOMY: SUR193

## 2013-06-26 ENCOUNTER — Other Ambulatory Visit: Payer: Self-pay | Admitting: *Deleted

## 2013-06-26 MED ORDER — NITROGLYCERIN 0.4 MG SL SUBL: 0.4000 mg | SUBLINGUAL_TABLET | SUBLINGUAL | Status: DC | PRN

## 2013-08-01 ENCOUNTER — Ambulatory Visit: Payer: Medicare Other | Admitting: Cardiovascular Disease

## 2013-12-09 ENCOUNTER — Ambulatory Visit (INDEPENDENT_AMBULATORY_CARE_PROVIDER_SITE_OTHER): Payer: Medicare Other | Admitting: Neurology

## 2013-12-09 ENCOUNTER — Encounter: Payer: Self-pay | Admitting: Neurology

## 2013-12-09 VITALS — BP 117/75 | HR 68 | Resp 18 | Ht 67.5 in | Wt 222.0 lb

## 2013-12-09 DIAGNOSIS — G4730 Sleep apnea, unspecified: Secondary | ICD-10-CM

## 2013-12-09 DIAGNOSIS — I25810 Atherosclerosis of coronary artery bypass graft(s) without angina pectoris: Secondary | ICD-10-CM

## 2013-12-09 DIAGNOSIS — R0683 Snoring: Secondary | ICD-10-CM | POA: Insufficient documentation

## 2013-12-09 DIAGNOSIS — R0989 Other specified symptoms and signs involving the circulatory and respiratory systems: Secondary | ICD-10-CM

## 2013-12-09 DIAGNOSIS — R0609 Other forms of dyspnea: Secondary | ICD-10-CM

## 2013-12-09 DIAGNOSIS — R0902 Hypoxemia: Secondary | ICD-10-CM | POA: Insufficient documentation

## 2013-12-09 DIAGNOSIS — G4710 Hypersomnia, unspecified: Secondary | ICD-10-CM

## 2013-12-09 HISTORY — DX: Atherosclerosis of coronary artery bypass graft(s) without angina pectoris: I25.810

## 2013-12-09 NOTE — Progress Notes (Signed)
SLEEP MEDICINE CLINIC   Provider:  Melvyn Novas, M D  Referring Provider: Garth Schlatter, MD Primary Care Physician:  Myrtis Ser., MD  Chief Complaint  Patient presents with  . New Evaluation    Room 11  . Sleep consult    HPI:  [name] is a 68 y.o., married, caucasian , right handed  male , who is seen here as a referral  from Dr. Jacinto Halim and  Ivory Broad for a sleep evaluation,  [name] was referred after developing angina pectoris with known coronary artery disease, hearing loss and status post CABG . He had a coronary angiogram on 01-01-13 documenting kinesis of the posterior basal segment of the heart with an EF of 40%, mild mitral regurgitation.  Right dominant circulation or carotid proximal circumflex and TG toward ICA occluded as well. Diffuse disease in other parts.  The saphenous vein graft from 1996 when he underwent CABG shows some diffuse disease as well. The patient continues to take all antiplatelet therapy as an HbA1c of 5.3,  a total cholesterol of 156.  Additional risk factor management we'll address the possibility of the patient having obstructive sleep apnea.  Dr. Lewis Moccasin had ordered a nocturnal pulse oximetry which documented 16 minutes at or below 88% saturation , and 31 minutes of less than 89% saturation desaturation events were 62 and were 10/hr. . The patient was started by Dr. Reece Agar. on oxygen supplementation at 2-1/2 L per minute.   The patient states that he feels off best all day at his bedtime, in return delays going to bed frequently.  Bedtime is ow 2 AM and he will need 1 to 60 minutes to go to sleep. He takes 2 xanax every night, for decades.  He used to nap well in his recliner, but now wakes up with angina. He is concerned about his angina when asleep in supine. He goes to sleep on his side, or prone, but wakes up on his back.  He has chronic nasal congestion, he has undergone a septal deviation repair but remains restricted in nasal airflow.  He has a mustache.  He stopped smoking October 20 1983, after smoking since 1962. He rarely drinks ETOH.   He believes he had never had a stroke, but underwent a CAE on the left with a " TIA" , January 2015.    The patient lives In [place] , [place].      Review of Systems: Out of a complete 14 system review, the patient complains of only the following symptoms, and all other reviewed systems are negative. Sleepiness , fatigue, hearing loss,  Dysphonia of voice. Angina   Epworth score 8 , Fatigue severity score 48  , depression score 9 points    History   Social History  . Marital Status: Married    Spouse Name: [name]    Number of Children: 0  . Years of Education: Grad. scho   Occupational History  . Not on file.   Social History Main Topics  . Smoking status: Former Smoker    Quit date: 10/20/1983  . Smokeless tobacco: Never Used  . Alcohol Use: Yes     Comment: occas.  . Drug Use: No  . Sexual Activity: Not on file   Other Topics Concern  . 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   Patient is retired.   Patient has a Doctor, general practice  education.   Patient is right-handed.   Patient drinks 3-4 cups of coffee and three cups of soda/tea per day.    History reviewed. No pertinent family history.  Past Medical History  Diagnosis Date  . MI (myocardial infarction)     (several)  . Coronary artery disease   . Hypertension   . Diabetes mellitus   . Kidney stone   . Hearing loss     wears bilateral hearing aids    Past Surgical History  Procedure Laterality Date  . Kidney stone surgery    . Coronary artery bypass graft      5 vessels    Current Outpatient Prescriptions  Medication Sig Dispense Refill  . ALPRAZolam (XANAX) 0.5 MG tablet Take 0.5 mg by mouth at bedtime as needed.        Marland Kitchen aspirin 81 MG tablet Take 81 mg by mouth daily.      Marland Kitchen atorvastatin (LIPITOR) 10 MG tablet Take 10 mg by  mouth daily.      . carvedilol (COREG) 25 MG tablet Take 25 mg by mouth 2 (two) times daily with a meal.      . Cholecalciferol (VITAMIN D) 2000 UNITS CAPS Take 1 capsule by mouth daily.      Marland Kitchen Clopidogrel Bisulfate (PLAVIX PO) Take by mouth daily.        Marland Kitchen docusate sodium (COLACE) 100 MG capsule Take 100 mg by mouth 2 (two) times daily.      . FENOFIBRATE PO Take by mouth daily.        . ISOSORBIDE DINITRATE PO Take by mouth.        Marland Kitchen LOSARTAN POTASSIUM PO Take by mouth daily.        . Magnesium 250 MG TABS Take 1 tablet by mouth 2 (two) times daily.      . metFORMIN (GLUCOPHAGE) 1000 MG tablet Take 1,000 mg by mouth 2 (two) times daily with a meal.        . Multiple Vitamins-Minerals (MULTIVITAMIN PO) Take 1 tablet by mouth daily.      . nitroGLYCERIN (NITROSTAT) 0.4 MG SL tablet Place 1 tablet (0.4 mg total) under the tongue every 5 (five) minutes as needed for chest pain.  25 tablet  2  . Omega-3 Fatty Acids (FISH OIL) 1000 MG CAPS Take 1 capsule by mouth 2 (two) times daily.      . polyethylene glycol (MIRALAX / GLYCOLAX) packet Take 17 g by mouth daily as needed.      . ranolazine (RANEXA) 1000 MG SR tablet Take 500 mg by mouth 2 (two) times daily.         No current facility-administered medications for this visit.    Allergies as of 12/09/2013 - Review Complete 12/09/2013  Allergen Reaction Noted  . Penicillins  03/14/2011    Vitals: BP 117/75  Pulse 68  Resp 18  Ht 5' 7.5" (1.715 m)  Wt 222 lb (100.699 kg)  BMI 34.24 kg/m2 Last Weight:  Wt Readings from Last 1 Encounters:  12/09/13 222 lb (100.699 kg)       Last Height:   Ht Readings from Last 1 Encounters:  12/09/13 5' 7.5" (1.715 m)    Physical exam:  General: The patient is awake, alert and appears not in acute distress. The patient is well groomed. Head: Normocephalic, atraumatic. Neck is supple. Mallampati 3 with a short airway, elongate uvula and tonsils, his uvula deviated to the left. ,  neck circumference:  17 .  Nasal airflow restricted , TMJ is not  evident . Retrognathia is not seen.  Cardiovascular:  Regular rate and rhyth, without  murmurs or carotid bruit, and without distended neck veins. Respiratory: Lungs are clear to auscultation. Skin:  Without evidence of edema, or rash Trunk: BMI is  elevated and patient  has normal posture.  Neurologic exam : The patient is awake and alert, oriented to place and time.   Memory subjective  described as intact. There is a normal attention span & concentration ability.  Speech is fluent with dysphonia . Mood and affect are appropriate.  Cranial nerves: Pupils are equal and briskly reactive to light. Funduscopic exam without   evidence of pallor or edema. Extraocular movements  in vertical and horizontal planes intact and without nystagmus. Visual fields by finger perimetry are intact. Hearing to finger rub intact.  Facial sensation intact to fine touch. Facial motor strength is lower on the left and uvula moves to the left .  Motor exam:  Elevated musclel tone  On the right arm, pronator drift and dysmetria.   Gait and station: Patient walks without assistive device and is able unassisted to climb up to the exam table.  Strength within normal limits.  Deep tendon reflexes: in the  upper and lower extremities are symmetric and intact. Babinski maneuver response is downgoing.   Assessment:  After physical and neurologic examination, review of laboratory studies, imaging, neurophysiology testing and pre-existing records, assessment is   1)  Patient with hypoxemia and snoring, EDS-  likely OSA.   2)  He likely had a stroke and  not a  TIA in January 2015. See pronator drift . 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  The patient was advised of the nature of the diagnosed sleep disorder , the treatment options and risks for general a health and wellness arising from not treating the condition. His hypoxemia may be correctable with CPAP, should  there be apnea. unfortunately his insurance carrier requires a higher split AHI.    Visit duration was 60 minutes.   Plan:  Treatment plan and additional workup : SPLIT at 15 and score 4% , CO2 retention to be measured. Patient is on Oxygen at home, supplemented at 2.5 litres.  Please evaluate if CPAP can be used in this patient with nasal congestion, if CPAP could replace the 02 .      Porfirio Mylar Cerise Lieber MD  12/09/2013

## 2013-12-09 NOTE — Progress Notes (Deleted)
Subjective:    Patient ID: [name] is a 68 y.o. male.  HPI {Common ambulatory SmartLinks:[id number]}  Review of Systems  Objective:  Neurologic Exam  Physical Exam  Assessment:   ***  Plan:   ***

## 2014-01-01 ENCOUNTER — Ambulatory Visit (INDEPENDENT_AMBULATORY_CARE_PROVIDER_SITE_OTHER): Payer: Medicare Other | Admitting: Neurology

## 2014-01-01 DIAGNOSIS — R0683 Snoring: Secondary | ICD-10-CM

## 2014-01-01 DIAGNOSIS — G4710 Hypersomnia, unspecified: Secondary | ICD-10-CM

## 2014-01-01 DIAGNOSIS — R0989 Other specified symptoms and signs involving the circulatory and respiratory systems: Secondary | ICD-10-CM

## 2014-01-01 DIAGNOSIS — G4730 Sleep apnea, unspecified: Secondary | ICD-10-CM

## 2014-01-01 DIAGNOSIS — R0609 Other forms of dyspnea: Secondary | ICD-10-CM

## 2014-01-01 DIAGNOSIS — G4733 Obstructive sleep apnea (adult) (pediatric): Secondary | ICD-10-CM

## 2014-01-13 ENCOUNTER — Telehealth: Payer: Self-pay | Admitting: *Deleted

## 2014-01-13 NOTE — Telephone Encounter (Signed)
Patient's wife calling in requesting an appointment with Dr Vickey Hugerohmeier, appointment was scheduled for 01/15/14 at 3:30 pm

## 2014-01-13 NOTE — Telephone Encounter (Signed)
Called and left the patient a message informing him of the overall finding of mild sleep apnea.  Patient was instructed that Dr. Vickey Hugerohmeier had requested a follow up visit with him to go over the results.  Patient was informed to contact our office to schedule a follow up visit.  Patient was informed we would mail a copy of the test results to him and a copy was faxed to the referring MD; Dr. Delrae RendJagadeesh Ganji

## 2014-01-15 ENCOUNTER — Telehealth: Payer: Self-pay | Admitting: Neurology

## 2014-01-15 ENCOUNTER — Encounter (INDEPENDENT_AMBULATORY_CARE_PROVIDER_SITE_OTHER): Payer: Self-pay

## 2014-01-15 ENCOUNTER — Encounter: Payer: Self-pay | Admitting: Neurology

## 2014-01-15 ENCOUNTER — Ambulatory Visit (INDEPENDENT_AMBULATORY_CARE_PROVIDER_SITE_OTHER): Payer: Medicare Other | Admitting: Neurology

## 2014-01-15 VITALS — BP 118/68 | HR 64 | Temp 96.9°F | Resp 14 | Ht 68.75 in | Wt 223.0 lb

## 2014-01-15 DIAGNOSIS — G4736 Sleep related hypoventilation in conditions classified elsewhere: Secondary | ICD-10-CM | POA: Insufficient documentation

## 2014-01-15 NOTE — Telephone Encounter (Signed)
Patient wished to discuss sleep test results with the MD>

## 2014-01-15 NOTE — Patient Instructions (Signed)
Duloxetine delayed-release capsules What is this medicine? DULOXETINE (doo LOX e teen) is used to treat depression, anxiety, and different types of chronic pain. This medicine may be used for other purposes; ask your health care provider or pharmacist if you have questions. COMMON BRAND NAME(S): Cymbalta What should I tell my health care provider before I take this medicine? They need to know if you have any of these conditions: -bipolar disorder or a family history of bipolar disorder -glaucoma -kidney disease -liver disease -suicidal thoughts or a previous suicide attempt -taken medicines called MAOIs like Carbex, Eldepryl, Marplan, Nardil, and Parnate within 14 days -an unusual reaction to duloxetine, other medicines, foods, dyes, or preservatives -pregnant or trying to get pregnant -breast-feeding How should I use this medicine? Take this medicine by mouth with a glass of water. Follow the directions on the prescription label. Do not cut, crush or chew this medicine. You can take this medicine with or without food. Take your medicine at regular intervals. Do not take your medicine more often than directed. Do not stop taking this medicine suddenly except upon the advice of your doctor. Stopping this medicine too quickly may cause serious side effects or your condition may worsen. A special MedGuide will be given to you by the pharmacist with each prescription and refill. Be sure to read this information carefully each time. Talk to your pediatrician regarding the use of this medicine in children. While this drug may be prescribed for children as young as 7 years of age for selected conditions, precautions do apply. Overdosage: If you think you have taken too much of this medicine contact a poison control center or emergency room at once. NOTE: This medicine is only for you. Do not share this medicine with others. What if I miss a dose? If you miss a dose, take it as soon as you can. If it  is almost time for your next dose, take only that dose. Do not take double or extra doses. What may interact with this medicine? Do not take this medicine with any of the following medications: -certain diet drugs like dexfenfluramine, fenfluramine -desvenlafaxine -linezolid -MAOIs like Azilect, Carbex, Eldepryl, Marplan, Nardil, and Parnate -methylene blue (intravenous) -milnacipran -thioridazine -venlafaxine This medicine may also interact with the following medications: -alcohol -aspirin and aspirin-like medicines -certain antibiotics like ciprofloxacin and enoxacin -certain medicines for blood pressure, heart disease, irregular heart beat -certain medicines for depression, anxiety, or psychotic disturbances -certain medicines for migraine headache like almotriptan, eletriptan, frovatriptan, naratriptan, rizatriptan, sumatriptan, zolmitriptan -certain medicines that treat or prevent blood clots like warfarin, enoxaparin, and dalteparin -cimetidine -fentanyl -lithium -NSAIDS, medicines for pain and inflammation, like ibuprofen or naproxen -phentermine -procarbazine -sibutramine -St. John's wort -theophylline -tramadol -tryptophan This list may not describe all possible interactions. Give your health care provider a list of all the medicines, herbs, non-prescription drugs, or dietary supplements you use. Also tell them if you smoke, drink alcohol, or use illegal drugs. Some items may interact with your medicine. What should I watch for while using this medicine? Tell your doctor if your symptoms do not get better or if they get worse. Visit your doctor or health care professional for regular checks on your progress. Because it may take several weeks to see the full effects of this medicine, it is important to continue your treatment as prescribed by your doctor. Patients and their families should watch out for new or worsening thoughts of suicide or depression. Also watch out for  sudden changes in   feelings such as feeling anxious, agitated, panicky, irritable, hostile, aggressive, impulsive, severely restless, overly excited and hyperactive, or not being able to sleep. If this happens, especially at the beginning of treatment or after a change in dose, call your health care professional. You may get drowsy or dizzy. Do not drive, use machinery, or do anything that needs mental alertness until you know how this medicine affects you. Do not stand or sit up quickly, especially if you are an older patient. This reduces the risk of dizzy or fainting spells. Alcohol may interfere with the effect of this medicine. Avoid alcoholic drinks. This medicine can cause an increase in blood pressure. This medicine can also cause a sudden drop in your blood pressure, which may make you feel faint and increase the chance of a fall. These effects are most common when you first start the medicine or when the dose is increased, or during use of other medicines that can cause a sudden drop in blood pressure. Check with your doctor for instructions on monitoring your blood pressure while taking this medicine. Your mouth may get dry. Chewing sugarless gum or sucking hard candy, and drinking plenty of water may help. Contact your doctor if the problem does not go away or is severe. What side effects may I notice from receiving this medicine? Side effects that you should report to your doctor or health care professional as soon as possible: -allergic reactions like skin rash, itching or hives, swelling of the face, lips, or tongue -changes in blood pressure -confusion -dark urine -dizziness -fast talking and excited feelings or actions that are out of control -fast, irregular heartbeat -fever -general ill feeling or flu-like symptoms -hallucination, loss of contact with reality -light-colored stools -loss of balance or coordination -redness, blistering, peeling or loosening of the skin, including  inside the mouth -right upper belly pain -seizures -suicidal thoughts or other mood changes -trouble concentrating -trouble passing urine or change in the amount of urine -unusual bleeding or bruising -unusually weak or tired -yellowing of the eyes or skin Side effects that usually do not require medical attention (report to your doctor or health care professional if they continue or are bothersome): -blurred vision -change in appetite -change in sex drive or performance -headache -increased sweating -nausea This list may not describe all possible side effects. Call your doctor for medical advice about side effects. You may report side effects to FDA at 1-800-FDA-1088. Where should I keep my medicine? Keep out of the reach of children. Store at room temperature between 15 and 30 degrees C (59 and 86 degrees F). Throw away any unused medicine after the expiration date. NOTE: This sheet is a summary. It may not cover all possible information. If you have questions about this medicine, talk to your doctor, pharmacist, or health care provider.  2015, [employer]vier/[employer] Standard. (2013-03-19 14:20:31)  

## 2014-01-15 NOTE — Progress Notes (Signed)
[facility]   Provider:  Melvyn Novasarmen  Naureen Benton, M D  Referring Provider: Myrtis Brooks, John E., MD Primary Care Physician:  [name], JOHN E., MD  Chief Complaint  Patient presents with  . RV  Sleep    Wife, RM 10    HPI:  [name] is a 68 y.o., married, caucasian , right handed  male , who is seen here as a referral  from Dr. Jacinto HalimGanji and  Rubye OaksPalmer for a sleep evaluation,  [name] was referred after developing angina pectoris with known coronary artery disease, hearing loss and status post CABG . He had a coronary angiogram on 01-01-13 documenting kinesis of the posterior basal segment of the heart with an EF of 40%, mild mitral regurgitation.  Right dominant circulation or carotid proximal circumflex and TG toward ICA occluded as well. Diffuse disease in other parts.  The saphenous vein graft from 1996 when he underwent CABG shows some diffuse disease as well. The patient continues to take all antiplatelet therapy as an HbA1c of 5.3,  a total cholesterol of 156.  Additional risk factor management we'll address the possibility of the patient having obstructive sleep apnea.  Dr. Lewis MoccasinGandji had ordered a nocturnal pulse oximetry which documented 16 minutes at or below 88% saturation , and 31 minutes of less than 89% saturation desaturation events were 62 and were 10/hr. . The patient was started by Dr. Reece AgarG. on oxygen supplementation at 2-1/2 L per minute.   The patient states that he feels off best all day at his bedtime, in return delays going to bed frequently.  Bedtime is ow 2 AM and he will need 1 to 60 minutes to go to sleep. He takes 2 xanax every night, for decades.  He used to nap well in his recliner, but now wakes up with angina. He is concerned about his angina when asleep in supine. He goes to sleep on his side, or prone, but wakes up on his back.  He has chronic nasal congestion, he has undergone a septal deviation repair but remains restricted in nasal airflow. He has a mustache. He  stopped smoking October 20 1983, after smoking since 1962. He rarely drinks ETOH.   He believes he had never had a stroke, but underwent a Carotid End arterectomy  on the left with a " TIA" , January 2015.  [place].     01-15-14 Interval history :  [name] returns after his sleep study , which he found enjoyable.  The sleep study took place on 01-01-14 the patient had endorsed CPAP for a score of at 6 points and the fatigue score at 48 points. He is also endorsed the pH Q8 14 points which indicates depression. His AHI was 7.5 which is truly a mild degree his RDI 8.4 in rem sleep his apnea index increased is 33 the pylorus sleep. His lowest oxygen saturation was 82% and is a total time of 15.4 minutes. His heart rate was irregular. I suggested that there could be two treatment options.   #1  REM sleep suppressant medication that would decrease apnea since it was REM dependent and off on the CPAP treatment as longer necessary.  Patient chose cymbalta .   #2 if the patient is loudly snoring and feels that his respiratory disturbance he is high CPAP should be attempted at a law pressure.   Review of Systems: Out of a complete 14 system review, the patient complains of only  the following symptoms, and all other reviewed systems are negative. Sleepiness , fatigue, hearing loss,  Dysphonia of voice. Angina   Epworth score 6 , Fatigue severity score 48  , depression score 3 points today.   History   Social History  . Marital Status: Married    Spouse Name: [name]    Number of Children: 0  . Years of Education: Grad. scho   Occupational History  . Not on file.   Social History Main Topics  . Smoking status: Former Smoker    Quit date: 10/20/1983  . Smokeless tobacco: Never Used  . Alcohol Use: Yes     Comment: occas.  . Drug Use: No  . Sexual Activity: Not on file   Other Topics Concern  . 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   Patient is retired.   Patient has a Naval architect.   Patient is right-handed.   Patient drinks 3-4 cups of coffee and three cups of soda/tea per day.    History reviewed. No pertinent family history.  Past Medical History  Diagnosis Date  . MI (myocardial infarction)     (several)  . Coronary artery disease   . Hypertension   . Diabetes mellitus   . Kidney stone   . Hearing loss     wears bilateral hearing aids  . CAD (coronary artery disease) of artery bypass graft 12/09/2013    Past Surgical History  Procedure Laterality Date  . Kidney stone surgery    . Coronary artery bypass graft      5 vessels  . Broken wrist surgery Left 2001  . Carotid endarterectomy      Current Outpatient Prescriptions  Medication Sig Dispense Refill  . ALPRAZolam (XANAX) 0.5 MG tablet Take 0.5 mg by mouth at bedtime as needed.        Marland Kitchen aspirin 81 MG tablet Take 81 mg by mouth daily.      Marland Kitchen atorvastatin (LIPITOR) 10 MG tablet Take 10 mg by mouth daily.      . carvedilol (COREG) 25 MG tablet Take 25 mg by mouth 2 (two) times daily with a meal.      . Cholecalciferol (VITAMIN D) 2000 UNITS CAPS Take 1 capsule by mouth daily.      Marland Kitchen Clopidogrel Bisulfate (PLAVIX PO) Take by mouth daily.        Marland Kitchen docusate sodium (COLACE) 100 MG capsule Take 100 mg by mouth 2 (two) times daily.      . DULoxetine (CYMBALTA) 20 MG capsule Take 20 mg by mouth daily.      . FENOFIBRATE PO Take by mouth daily.        . ISOSORBIDE DINITRATE PO Take 60 mg by mouth daily.       Marland Kitchen LOSARTAN POTASSIUM PO Take by mouth daily.        . Magnesium 250 MG TABS Take 1 tablet by mouth 2 (two) times daily.      . metFORMIN (GLUCOPHAGE) 1000 MG tablet Take 1,000 mg by mouth 2 (two) times daily with a meal.        . Multiple Vitamins-Minerals (MULTIVITAMIN PO) Take 1 tablet by mouth daily.      . nitroGLYCERIN (NITROSTAT) 0.4 MG SL tablet Place 1 tablet (0.4 mg total) under the  tongue every 5 (five) minutes as needed for chest pain.  25 tablet  2  . Omega-3  Fatty Acids (FISH OIL) 1000 MG CAPS Take 1 capsule by mouth 2 (two) times daily.      . polyethylene glycol (MIRALAX / GLYCOLAX) packet Take 17 g by mouth daily as needed.      . ranolazine (RANEXA) 1000 MG SR tablet Take 500 mg by mouth 2 (two) times daily.         No current facility-administered medications for this visit.    Allergies as of 01/15/2014 - Review Complete 01/15/2014  Allergen Reaction Noted  . Penicillins  03/14/2011    Vitals: BP 118/68  Pulse 64  Temp(Src) 96.9 F (36.1 C) (Oral)  Resp 14  Ht 5' 8.75" (1.746 m)  Wt 223 lb (101.152 kg)  BMI 33.18 kg/m2 Last Weight:  Wt Readings from Last 1 Encounters:  01/15/14 223 lb (101.152 kg)       Last Height:   Ht Readings from Last 1 Encounters:  01/15/14 5' 8.75" (1.746 m)    Physical exam:  General: The patient is awake, alert and appears not in acute distress. The patient is well groomed. Head: Normocephalic, atraumatic. Neck is supple. Mallampati 3 with a short airway, elongate uvula and tonsils, his uvula deviated to the left. ,  neck circumference: 17 . Nasal airflow restricted , TMJ is not  evident . Retrognathia is not seen.  Cardiovascular:  Regular rate and rhyth, without  murmurs or carotid bruit, and without distended neck veins. Respiratory: Lungs are clear to auscultation. Skin:  Without evidence of edema, or rash Trunk: BMI is  elevated and patient  has normal posture.  Neurologic exam : The patient is awake and alert, oriented to place and time.   Memory subjective  described as intact. There is a normal attention span & concentration ability.  Speech is fluent with dysphonia . Mood and affect are appropriate.  Cranial nerves: Pupils are equal and briskly reactive to light. Funduscopic exam without   evidence of pallor or edema. Extraocular movements  in vertical and horizontal planes intact and without nystagmus.  Visual fields by finger perimetry are intact. Hearing to finger rub intact.  Facial sensation intact to fine touch. Facial motor strength is lower on the left and uvula moves to the left .  Motor exam:  Elevated musclel tone  On the right arm, pronator drift and dysmetria.   Gait and station: Patient walks without assistive device and is able unassisted to climb up to the exam table.  Strength within normal limits.  Deep tendon reflexes: in the  upper and lower extremities are symmetric and intact. Babinski maneuver response is downgoing.   Assessment:  After physical and neurologic examination, review of laboratory studies, imaging, neurophysiology testing and pre-existing records, assessment is   1)  Patient with hypoxemia and snoring, EDS-  likely OSA.   2)  He likely had a stroke and  not a  TIA in January 2015. See pronator drift . 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  The patient was advised of the nature of the diagnosed sleep disorder , the treatment options and risks for general a health and wellness arising from not treating the condition. His hypoxemia may be correctable with CPAP, should there be apnea. unfortunately his insurance carrier requires a higher split AHI.    Visit duration was 30 minutes.   Plan:  Treatment plan and additional workup : REM dependent apnea, patient would like to treat with REM supressent medication. In this case he started  Cymbalta.  CO2 retention. Patient is on Oxygen at home, continue O2 supplemented at 2.5 litres. Dr Jacinto Halim prescriber . He continues cardio rehab,         Melvyn Novas MD  01/15/2014

## 2014-02-18 ENCOUNTER — Telehealth: Payer: Self-pay | Admitting: Neurology

## 2014-02-18 NOTE — Telephone Encounter (Signed)
Left message for patient regarding changing 05/21/14 appointment time per Dr. Oliva Bustardohmeier's schedule.

## 2014-05-21 ENCOUNTER — Ambulatory Visit (INDEPENDENT_AMBULATORY_CARE_PROVIDER_SITE_OTHER): Payer: Medicare Other | Admitting: Neurology

## 2014-05-21 ENCOUNTER — Ambulatory Visit: Payer: Medicare Other | Admitting: Neurology

## 2014-05-21 ENCOUNTER — Encounter: Payer: Self-pay | Admitting: Neurology

## 2014-05-21 VITALS — BP 137/78 | HR 80 | Resp 18 | Ht 68.0 in | Wt 224.4 lb

## 2014-05-21 DIAGNOSIS — R0902 Hypoxemia: Secondary | ICD-10-CM

## 2014-05-21 DIAGNOSIS — G4733 Obstructive sleep apnea (adult) (pediatric): Secondary | ICD-10-CM

## 2014-05-21 DIAGNOSIS — I25709 Atherosclerosis of coronary artery bypass graft(s), unspecified, with unspecified angina pectoris: Secondary | ICD-10-CM

## 2014-05-21 NOTE — Progress Notes (Signed)
SLEEP MEDICINE CLINIC   Provider:  Melvyn Novas, M D  Referring Provider: Myrtis Ser., MD Primary Care Physician:  George Ser., MD  Chief Complaint  Patient presents with  . RV    Rm 11, wife    HPI:  [name] is a 69 y.o., married, caucasian , right handed  male , who is seen here as a referral  from George. Jacinto Brooks and  [name] for a sleep evaluation,  [name] was referred after developing angina pectoris with known coronary artery disease, hearing loss and status post CABG . He had a coronary angiogram on 01-01-13 documenting kinesis of the posterior basal segment of the heart with an EF of 40%, mild mitral regurgitation.Right dominant circulation or carotid proximal circumflex and TG toward ICA occluded as well. Diffuse disease in other parts.  The saphenous vein graft from 1996 when he underwent CABG shows some diffuse disease as well. The patient continues to take all antiplatelet therapy as an HbA1c of 5.3,  a total cholesterol of 156.  Additional risk factor management we'll address the possibility of the patient having obstructive sleep apnea.  [name] had ordered a nocturnal pulse oximetry,  which documented 16 minutes at or below 88% saturation , and 31 minutes of less than 89% saturation - desaturation events were 10/hr.  The patient was started by [name]. on oxygen supplementation at 2-1/2 L per minute. The patient states that he feels the best all day close to his bed-time, in return delays going to bed frequently.  Bedtime is  2 AM and he will need 1 to 60 minutes to go to sleep. He takes 2 xanax every night,  Has done for decades.  He used to nap well in his recliner, but now wakes up with angina. He is concerned about his angina when asleep in supine. He goes to sleep on his side, or prone, but wakes up on his back.  He has chronic nasal congestion, he has undergone a septal deviation repair but remains restricted in nasal airflow. He has a mustache. He stopped  smoking October 20 1983, after smoking since 1962. He rarely drinks ETOH.  He believes he had never had a stroke, but underwent a Carotid End arterectomy  on the left with a " TIA" , January 2015.  The patient lives In [place] , [place].    01-15-14 Interval history :  [name] returns after his sleep study , which he found enjoyable.  The sleep study took place on 01-01-14 the patient had endorsed CPAP for a score of at 6 points and the fatigue score at 48 points. He is also endorsed the pH Q8 14 points which indicates depression. His AHI was 7.5 which is truly a mild degree his RDI 8.4 in rem sleep his apnea index increased is 33 the pylorus sleep. His lowest oxygen saturation was 82% and is a total time of 15.4 minutes. His heart rate was irregular. I suggested that there could be two treatment options.  #1  REM sleep suppressant medication that would decrease apnea since it was REM dependent and off on the CPAP treatment as longer necessary.  Patient chose cymbalta .   #2 if the patient is loudly snoring and feels that his respiratory disturbance he is high CPAP should be attempted at a lower pressure. REM dependent apnea, patient would like to treat with REM supressent medication. In this case he started Cymbalta.  CO2 retention. Patient is  on Oxygen at home, continue O2 supplemented at 2.5 litres. [name] prescriber . He continues cardio rehab,  Interval history to-10-16  the patient reports that the Cymbalta has helped him to sleep deeper and sounder. Cymbalta is used as a REM sleep suppressant in his case as he had REM dependent sleep apnea. His wife added that he is also less panicky less anxious. He noticed less anxiety. He still sleeps sounder and falls asleep quicker than he used to. Sleep latency is now less than an hour and maybe 30 minutes. He will have one nocturia. He does not have any headaches in the morning.     Review of Systems: Out of a complete 14 system review, the patient  complains of only the following symptoms, and all other reviewed systems are negative. Sleepiness , fatigue, hearing loss,  Dysphonia of voice. Dysphagia.   Epworth score 9 , Fatigue severity score 49  , depression score 3 points 05-21-14   History   Social History  . Marital Status: Married    Spouse Name: [name]  . Number of Children: 0  . Years of Education: Grad. scho   Occupational History  . Not on file.   Social History Main Topics  . Smoking status: Former Smoker    Quit date: 10/20/1983  . Smokeless tobacco: Never Used  . Alcohol Use: Yes     Comment: occas.  . Drug Use: No  . Sexual Activity: Not on file   Other Topics Concern  . 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   Patient is retired.   Patient has a Naval architect.   Patient is right-handed.   Patient drinks 3-4 cups of coffee and three cups of soda/tea per day.    Past Medical History  Diagnosis Date  . MI (myocardial infarction)     (several)  . Coronary artery disease   . Hypertension   . Diabetes mellitus   . Kidney stone   . Hearing loss     wears bilateral hearing aids  . CAD (coronary artery disease) of artery bypass graft 12/09/2013    Past Surgical History  Procedure Laterality Date  . Kidney stone surgery    . Coronary artery bypass graft      5 vessels  . Broken wrist surgery Left 2001  . Carotid endarterectomy      Current Outpatient Prescriptions  Medication Sig Dispense Refill  . ALPRAZolam (XANAX) 0.5 MG tablet Take 0.5 mg by mouth at bedtime as needed.      Marland Kitchen aspirin 81 MG tablet Take 81 mg by mouth daily.    Marland Kitchen atorvastatin (LIPITOR) 10 MG tablet Take 10 mg by mouth daily.    . carvedilol (COREG) 25 MG tablet Take 25 mg by mouth 2 (two) times daily with a meal.    . Cholecalciferol (VITAMIN D) 2000 UNITS CAPS Take 1 capsule by mouth daily.    Marland Kitchen Clopidogrel Bisulfate (PLAVIX PO) Take by mouth  daily.      Marland Kitchen docusate sodium (COLACE) 100 MG capsule Take 100 mg by mouth 2 (two) times daily.    . DULoxetine (CYMBALTA) 20 MG capsule Take 20 mg by mouth daily.    . FENOFIBRATE PO Take by mouth daily.      . ISOSORBIDE DINITRATE PO Take 60 mg by mouth daily.     Marland Kitchen LOSARTAN POTASSIUM PO Take by mouth daily.      Marland Kitchen  Magnesium 250 MG TABS Take 1 tablet by mouth 2 (two) times daily.    . metFORMIN (GLUCOPHAGE) 1000 MG tablet Take 1,000 mg by mouth 2 (two) times daily with a meal.      . Multiple Vitamins-Minerals (MULTIVITAMIN PO) Take 1 tablet by mouth daily.    . nitroGLYCERIN (NITROSTAT) 0.4 MG SL tablet Place 1 tablet (0.4 mg total) under the tongue every 5 (five) minutes as needed for chest pain. 25 tablet 2  . Omega-3 Fatty Acids (FISH OIL) 1000 MG CAPS Take 1 capsule by mouth 2 (two) times daily.    . polyethylene glycol (MIRALAX / GLYCOLAX) packet Take 17 g by mouth daily as needed.    . ranolazine (RANEXA) 1000 MG SR tablet Take 500 mg by mouth 2 (two) times daily.       No current facility-administered medications for this visit.    Allergies as of 05/21/2014 - Review Complete 05/21/2014  Allergen Reaction Noted  . Penicillins  03/14/2011    Vitals: BP 137/78 mmHg  Pulse 80  Resp 18  Ht  (1.727 m)  Wt 224 lb 6.4 oz (101.787 kg)  BMI 34.13 kg/m2 Last Weight:  Wt Readings from Last 1 Encounters:  05/21/14 224 lb 6.4 oz (101.787 kg)       Last Height:   Ht Readings from Last 1 Encounters:  05/21/14  (1.727 m)    Physical exam:  General: The patient is awake, alert and appears not in acute distress. The patient is well groomed. Head: Normocephalic, atraumatic. Neck is supple. Mallampati 3 with a short airway, elongate uvula and tonsils, his uvula deviated to the left. ,  neck circumference: 17 . Nasal airflow restricted , TMJ is not  evident . Retrognathia is not seen.  Cardiovascular:  Regular rate and rhyth, without  murmurs or carotid bruit, and without  distended neck veins. Respiratory: Lungs are clear to auscultation. Skin:  Without evidence of edema, or rash Trunk: BMI is  elevated and patient  has normal posture.  Neurologic exam : The patient is awake and alert, oriented to place and time.   Memory subjective  described as intact. There is a normal attention span & concentration ability.  Speech is fluent with dysphonia . Mood and affect are appropriate.  Cranial nerves: Pupils are equal and briskly reactive to light. Funduscopic exam without   evidence of pallor or edema. Extraocular movements  in vertical and horizontal planes intact and without nystagmus. Visual fields by finger perimetry are intact. Hearing to finger rub intact.  Facial sensation intact to fine touch.  Facial motor strength : has a droop of the right face and equal tongue  movement. Equal shoulder shrug   Motor exam:  Elevated tone on the right arm,  pronator drift and dysmetria.   Gait and station: Patient walks without assistive device and is able unassisted to climb up to the exam table.  Strength within normal limits.  Deep tendon reflexes: in the  upper and lower extremities are symmetric and intact. Babinski maneuver response is downgoing.   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 We used Cymbalta as a REM Supressant. Marland Kitchen   2)  He likely had a stroke and  not a  TIA in January 2015. See pronator drift . 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  The patient was advised of the nature of the diagnosed sleep disorder , the treatment options and risks for general a health and wellness arising from not treating the condition. His hypoxemia may be correctable with CPAP, should there be apnea. Unfortunately his insurance carrier requires a higher split  AHI. I will order  an ONO on Room air again.     Visit duration was 30 minutes.   Plan:  Treatment plan and additional workup :  ONO on cymbalta, used to treat REM dependent apnea.  RV with Megan to compare ONO with the sleep study result.          Porfirio Mylararmen Kloi Brodman MD  05/21/2014

## 2014-06-20 ENCOUNTER — Encounter: Payer: Self-pay | Admitting: Neurology

## 2014-07-15 NOTE — Progress Notes (Signed)
Quick Note:  LMVM for pt on home #, that ONO testing showed no addtinal oxygen needed. He is to call back for questions. ______

## 2014-10-17 ENCOUNTER — Encounter: Payer: Self-pay | Admitting: *Deleted

## 2014-11-04 ENCOUNTER — Encounter: Payer: Self-pay | Admitting: Cardiovascular Disease

## 2016-04-11 DIAGNOSIS — I639 Cerebral infarction, unspecified: Secondary | ICD-10-CM

## 2016-04-11 HISTORY — DX: Cerebral infarction, unspecified: I63.9

## 2016-08-10 ENCOUNTER — Telehealth: Payer: Self-pay | Admitting: Neurology

## 2016-08-10 NOTE — Telephone Encounter (Signed)
I called pt's wife, Frederik Schmidt, per DPR. I asked her if pt is still using his cpap? Pt's wife reports that he is not using cpap, only oxygen at night. I advised pt's wife that the appt with Dr. Vickey Huger next week is acceptable and we will see them then. Pt's wife verbalized understanding.

## 2016-08-10 NOTE — Telephone Encounter (Signed)
Patient reports new forgetfulness and mood changes that started about 3-4 wks ago after having a bad cold. His wife said within 3-4 days he had gastro issues(constipation) which causes anxiety he had a periodontal procedure and then acquired a cold. She said they both were extremely sick. Since that time he has been forgetful, asking the same question over and over, and lack of hygiene.  An appointment was made for 08/16/16.  Advised that the nurse would call if there was any other questions.

## 2016-08-16 ENCOUNTER — Ambulatory Visit: Payer: Medicare Other | Admitting: Neurology

## 2016-08-17 ENCOUNTER — Encounter: Payer: Self-pay | Admitting: Neurology

## 2016-10-24 ENCOUNTER — Encounter: Payer: Self-pay | Admitting: Neurology

## 2016-10-24 ENCOUNTER — Ambulatory Visit (INDEPENDENT_AMBULATORY_CARE_PROVIDER_SITE_OTHER): Payer: Medicare Other | Admitting: Neurology

## 2016-10-24 ENCOUNTER — Encounter (INDEPENDENT_AMBULATORY_CARE_PROVIDER_SITE_OTHER): Payer: Self-pay

## 2016-10-24 VITALS — BP 112/68 | HR 64

## 2016-10-24 DIAGNOSIS — G912 (Idiopathic) normal pressure hydrocephalus: Secondary | ICD-10-CM | POA: Diagnosis not present

## 2016-10-24 DIAGNOSIS — Z8673 Personal history of transient ischemic attack (TIA), and cerebral infarction without residual deficits: Secondary | ICD-10-CM | POA: Insufficient documentation

## 2016-10-24 NOTE — Patient Instructions (Signed)
Normal-Pressure Hydrocephalus Normal-pressure hydrocephalus (NPH) is a buildup of fluid (cerebrospinal fluid [CSF]) inside the brain. CSF is normal within the brain, but too much fluid can affect your brain's function. NPH commonly occurs in people older than 73 years of age. What are the causes? The cause of NPH is not always known. It can be caused by anything that blocks the flow of CSF. What are the signs or symptoms? Since many symptoms of NPH are also associated with conditions sometimes seen in older people, taking care to note changes in behavior and mental function is important. Symptoms of NPH may include:  Difficulty walking, such as: ? Problems when beginning to walk. ? Feet being "frozen" to the floor. ? Shuffling feet when walking. ? Unsteadiness.  Problems with bowel and bladder control.  Memory problems such as forgetfulness, lack of concentration, dull mood, or confusion.  How is this diagnosed?  A medical history and physical exam will be done. A physical exam can reveal walking (gait) changes. Reflexes may be increased in the lower legs.  Tests can include: ? Lumbar puncture (spinal tap). ? CT scan of your head. ? MRI scan of your head. How is this treated? NPH is treated by having surgery to place a ventriculoperitoneal shunt in the brain. The ventriculoperitoneal shunt drains the excess CSF. This information is not intended to replace advice given to you by your health care provider. Make sure you discuss any questions you have with your health care provider. Document Released: 08/12/2013 Document Revised: 11/23/2015 Document Reviewed: 04/02/2013 Elsevier Interactive Patient Education  2017 Elsevier Inc.  

## 2016-10-24 NOTE — Addendum Note (Signed)
Addended by: Melvyn NovasHMEIER, Adaira Centola on: 10/24/2016 11:44 AM   Modules accepted: Orders

## 2016-10-24 NOTE — Progress Notes (Addendum)
SLEEP MEDICINE CLINIC   Provider:  Melvyn Novas, M D  Referring Provider: Myrtis Ser., MD Primary Care Physician:  George Ser., MD  Chief Complaint  Patient presents with  . New Patient (Initial Visit)    new pt for memory. pt has been seen in past for sleep    HPI:  [name] is a 71 y.o., married, caucasian , right handed  male , who is seen here in a RV. He still resides in [place], and he comes back after 2.5 years . He wears hearing aids, and uses a seated walker. He was followed in the sleep clinic 2015 , but needs now  a memory evaluation , he felt. In the mean time he has developed kidney stones, urinary dribbling, sudden gait disturbance and his wife felt there was a sudden decline- in late April 2018  He continues to use oxygen instead of using CPAP - which he could not tolerate.  His brother in law, an internist, suspected NPH could be present. He has been seen at [facility] in [place], [place] and had back MRIs there.      06-2013 original referral  from [name] and [name] for a sleep evaluation,[name] was referred after developing angina pectoris with known coronary artery disease, hearing loss and status post CABG . He had a coronary angiogram on 01-01-13 documenting kinesis of the posterior basal segment of the heart with an EF of 40%, mild mitral regurgitation.Right dominant circulation or carotid proximal circumflex and TG toward ICA occluded as well. Diffuse disease in other parts.  The saphenous vein graft from 1996 when he underwent CABG shows some diffuse disease as well. The patient continues to take all antiplatelet therapy as an HbA1c of 5.3,  a total cholesterol of 156.  Additional risk factor management we'll address the possibility of the patient having obstructive sleep apnea.  Dr. Lewis Moccasin had ordered a nocturnal pulse oximetry,  which documented 16 minutes at or below 88% saturation , and 31 minutes of less than 89% saturation -  desaturation events were 10/hr.  The patient was started by Dr. Reece Agar. on oxygen supplementation at 2-1/2 L per minute. The patient states that he feels the best all day close to his bed-time, in return delays going to bed frequently.  Bedtime is  2 AM and he will need 1 to 60 minutes to go to sleep. He takes 2 xanax every night,  Has done for decades.  He used to nap well in his recliner, but now wakes up with angina. He is concerned about his angina when asleep in supine. He goes to sleep on his side, or prone, but wakes up on his back.  He has chronic nasal congestion, he has undergone a septal deviation repair but remains restricted in nasal airflow. He has a mustache. He stopped smoking October 20 1983, after smoking since 1962. He rarely drinks ETOH.  He believes he had never had a stroke, but underwent a Carotid End arterectomy  on the left with a " TIA" , January 2015.  The patient lives In [place] , [place].   01-15-14 Interval history :  [name] returns after his sleep study , which he found enjoyable. The sleep study took place on 01-01-14 the patient had endorsed CPAP for a score of at 6 points and the fatigue score at 48 points. He is also endorsed the pH Q8 14 points which indicates depression. His AHI was  7.5 which is truly a mild degree his RDI 8.4 in rem sleep his apnea index increased is 33 the pylorus sleep. His lowest oxygen saturation was 82% and is a total time of 15.4 minutes. His heart rate was irregular. I suggested that there could be two treatment options.  #1  REM sleep suppressant medication that would decrease apnea since it was REM dependent and off on the CPAP treatment as longer necessary.  Patient chose cymbalta .   #2 if the patient is loudly snoring and feels that his respiratory disturbance he is high CPAP should be attempted at a lower pressure. REM dependent apnea, patient would like to treat with REM supressent medication. In this case he started Cymbalta.  CO2  retention. Patient is on Oxygen at home, continue O2 supplemented at 2.5 litres. Dr Jacinto Brooks prescriber . He continues cardio rehab, Interval history to-10-16, the patient reports that the Cymbalta has helped him to sleep deeper and sounder. Cymbalta is used as a REM sleep suppressant in his case as he had REM dependent sleep apnea. His wife added that he is also less panicky less anxious. He noticed less anxiety. He still sleeps sounder and falls asleep quicker than he used to. Sleep latency is now less than an hour and maybe 30 minutes. He will have one nocturia.He does not have any headaches in the morning.    Review of Systems: Out of a complete 14 system review, the patient complains of only the following symptoms, and all other reviewed systems are negative. Sleepiness , fatigue, hearing loss,  Dysphonia of voice. Dysphagia - Gait  disorder- thought to be orthopedic.  forgetfulnes- now nocturia.  Epworth score 8 , Fatigue severity score 49  , depression score 5/ 15    Social History   Social History  . Marital status: Married    Spouse name: [name]  . Number of children: 0  . Years of education: Grad. scho   Occupational History  . Not on file.   Social History Main Topics  . Smoking status: Former Smoker    Quit date: 10/20/1983  . Smokeless tobacco: Never Used  . Alcohol use Yes     Comment: occas.  . Drug use: No  . Sexual activity: Not on file   Other Topics Concern  . 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   Patient is retired.   Patient has a Naval architect.   Patient is right-handed.   Patient drinks 3-4 cups of coffee and three cups of soda/tea per day.    Past Medical History:  Diagnosis Date  . CAD (coronary artery disease) of artery bypass graft 12/09/2013  . Coronary artery disease   . Diabetes mellitus   . Hearing loss    wears bilateral hearing aids  . History of  nephrolithiasis   . Hypertension   . Kidney stone   . MI (myocardial infarction) (HCC)    (several)    Past Surgical History:  Procedure Laterality Date  . broken wrist surgery Left 2001  . CAROTID ENDARTERECTOMY    . CORONARY ARTERY BYPASS GRAFT     5 vessels  . CORONARY ARTERY BYPASS GRAFT    . KIDNEY STONE SURGERY    . WRIST SURGERY      Current Outpatient Prescriptions  Medication Sig Dispense Refill  . ALPRAZolam (XANAX) 0.5 MG tablet Take 0.5 mg by mouth at bedtime as  needed.      Marland Kitchen. aspirin 81 MG tablet Take 81 mg by mouth daily.    Marland Kitchen. atorvastatin (LIPITOR) 10 MG tablet Take 10 mg by mouth daily.    . carvedilol (COREG) 25 MG tablet Take 25 mg by mouth 2 (two) times daily with a meal.    . Clopidogrel Bisulfate (PLAVIX PO) Take 75 mg by mouth daily.     . DULoxetine (CYMBALTA) 20 MG capsule Take 60 mg by mouth daily.     . FENOFIBRATE PO Take 160 mg by mouth daily.     . ISOSORBIDE DINITRATE PO Take 90 mg by mouth daily.     Marland Kitchen. LINZESS 290 MCG CAPS capsule 1 CAP(S) 30 MINUTES BEFORE BREAKFAST ORALLY 30 DAY(S)  11  . LOSARTAN POTASSIUM PO Take 25 mg by mouth daily.     . Magnesium 250 MG TABS Take 1 tablet by mouth 2 (two) times daily.    . Multiple Vitamins-Minerals (MULTIVITAMIN PO) Take 1 tablet by mouth daily.    . nitroGLYCERIN (NITROSTAT) 0.4 MG SL tablet Place 1 tablet (0.4 mg total) under the tongue every 5 (five) minutes as needed for chest pain. 25 tablet 2  . Omega-3 Fatty Acids (FISH OIL) 1000 MG CAPS Take 1 capsule by mouth 2 (two) times daily.    . ranolazine (RANEXA) 1000 MG SR tablet Take 1,000 mg by mouth 2 (two) times daily.     . tamsulosin (FLOMAX) 0.4 MG CAPS capsule Take 0.4 mg by mouth daily.  0   No current facility-administered medications for this visit.     Allergies as of 10/24/2016 - Review Complete 10/24/2016  Allergen Reaction Noted  . Penicillins  03/14/2011    Vitals: BP 112/68   Pulse 64  Last Weight:  Wt Readings from Last 1  Encounters:  05/21/14 224 lb 6.4 oz (101.8 kg)       Last Height:   Ht Readings from Last 1 Encounters:  05/21/14 5\' 8"  (1.727 m)    Physical exam:  General: The patient is awake, alert and appears not in acute distress.  Head: Normocephalic, atraumatic. Neck is supple. Mallampati 3 with a short airway, elongate uvula and tonsils, his uvula deviated to the left.   neck circumference: 17 ". Nasal airflow restricted , TMJ is not  evident . Retrognathia is not seen.  Cardiovascular:  Regular rate and rhyth, without  murmurs or carotid bruit, and without distended neck veins. Respiratory: Lungs are clear to auscultation. Skin:  Without evidence of edema, or rash Trunk: BMI is elevated and patient  has normal posture.  Neurologic exam : The patient is awake and alert, oriented to place and time.   Memory subjective  described as intact. There is a normal attention span & concentration ability. Speech is fluent with dysphonia . Mood and affect are appropriate. MMSE - Mini Mental State Exam 10/24/2016  Orientation to time 4  Orientation to time comments stated wrong date. 15th instead of 16th  Orientation to Place 5  Registration 3  Attention/ Calculation 5  Recall 3  Language- name 2 objects 2  Language- repeat 1  Language- follow 3 step command 3  Language- read & follow direction 1  Write a sentence 1  Copy design 1  Total score 29   27/30 - missed 2 out of 3 recall words.   Cranial nerves:Pupils are equal and briskly reactive to light. Visual fields by finger perimetry are intact. Hearing to finger rub intact.  Facial sensation intact to fine touch.  Facial motor strength : has a droop of the right face and equal tongue  movement. Equal shoulder shrug  Motor exam:  Elevated tone on the right arm, pronator drift and dysmetria. - this was present in 2015  Gait and station: Patient walks with a walker- that was not the case when last seen.   Deep tendon reflexes: in the  upper and  lower extremities are symmetric and intact. Babinski maneuver response is downgoing.   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 We used Cymbalta as a REM Supressant. He uses Oxygen at home for the last 2.5 years. Marland Kitchen   2)  He likely had a stroke and  not a  TIA in January 2015. 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 3) either a new stroke or NPH can account for the gait instability, confusion/ memory loss and his urinary problems.  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   Prolonged Re- Visit duration was 45 minutes.   Plan:  Treatment plan and additional workup :  MRI brain, rule out new stroke- check for NPH signs. Lab. : order CMET, CBC If MRI permits, we need LP , but patient is on Plavix and ASA. Needs to be off for 5 days.  Rv with George or me in 3-6 weeks. Will discuss aricept versus LP , versus  Stroke prevention.   Porfirio Mylar Altariq Goodall MD  10/24/2016

## 2016-10-25 LAB — COMPREHENSIVE METABOLIC PANEL
ALT: 22 IU/L (ref 0–44)
AST: 23 IU/L (ref 0–40)
Albumin/Globulin Ratio: 1.5 (ref 1.2–2.2)
Albumin: 4.1 g/dL (ref 3.5–4.8)
Alkaline Phosphatase: 39 IU/L (ref 39–117)
BUN/Creatinine Ratio: 22 (ref 10–24)
BUN: 22 mg/dL (ref 8–27)
Bilirubin Total: 0.3 mg/dL (ref 0.0–1.2)
CO2: 24 mmol/L (ref 20–29)
Calcium: 10.1 mg/dL (ref 8.6–10.2)
Chloride: 99 mmol/L (ref 96–106)
Creatinine, Ser: 0.99 mg/dL (ref 0.76–1.27)
GFR calc Af Amer: 87 mL/min/{1.73_m2} (ref >59)
GFR calc non Af Amer: 75 mL/min/{1.73_m2} (ref >59)
Globulin, Total: 2.7 g/dL (ref 1.5–4.5)
Glucose: 124 mg/dL — ABNORMAL HIGH (ref 65–99)
Potassium: 5.6 mmol/L — ABNORMAL HIGH (ref 3.5–5.2)
Sodium: 140 mmol/L (ref 134–144)
Total Protein: 6.8 g/dL (ref 6.0–8.5)

## 2016-10-25 LAB — CBC
Hematocrit: 38.9 % (ref 37.5–51.0)
Hemoglobin: 13.2 g/dL (ref 13.0–17.7)
MCH: 31.1 pg (ref 26.6–33.0)
MCHC: 33.9 g/dL (ref 31.5–35.7)
MCV: 92 fL (ref 79–97)
Platelets: 240 10*3/uL (ref 150–379)
RBC: 4.25 x10E6/uL (ref 4.14–5.80)
RDW: 14.3 % (ref 12.3–15.4)
WBC: 6.3 10*3/uL (ref 3.4–10.8)

## 2016-10-26 ENCOUNTER — Telehealth: Payer: Self-pay | Admitting: Neurology

## 2016-10-26 NOTE — Telephone Encounter (Signed)
Called and talked with Mrs [name] in regards to the pt's blood work. The pt had an elevated potassium of 5.6. All other lab looked good. Pt wife verbalized understanding and had no further questions at this time

## 2016-10-26 NOTE — Telephone Encounter (Signed)
-----   Message from Melvyn Novasarmen Dohmeier, MD sent at 10/26/2016  9:20 AM EDT ----- elevated potassium, but normal renal function.  Since this is a non fast test , the results are not concerning. Diet related  CD

## 2016-11-14 ENCOUNTER — Ambulatory Visit
Admission: RE | Admit: 2016-11-14 | Discharge: 2016-11-14 | Disposition: A | Discharge status: Home / Self Care | Payer: Medicare Other | Source: Ambulatory Visit | Attending: Neurology | Admitting: Neurology

## 2016-11-14 DIAGNOSIS — I25708 Atherosclerosis of coronary artery bypass graft(s), unspecified, with other forms of angina pectoris: Secondary | ICD-10-CM

## 2016-11-14 DIAGNOSIS — Z8673 Personal history of transient ischemic attack (TIA), and cerebral infarction without residual deficits: Secondary | ICD-10-CM

## 2016-11-14 DIAGNOSIS — I639 Cerebral infarction, unspecified: Secondary | ICD-10-CM

## 2016-11-14 DIAGNOSIS — G912 (Idiopathic) normal pressure hydrocephalus: Secondary | ICD-10-CM

## 2016-11-15 ENCOUNTER — Telehealth: Payer: Self-pay | Admitting: Neurology

## 2016-11-15 DIAGNOSIS — I639 Cerebral infarction, unspecified: Secondary | ICD-10-CM

## 2016-11-15 DIAGNOSIS — G4731 Primary central sleep apnea: Secondary | ICD-10-CM

## 2016-11-15 DIAGNOSIS — I2510 Atherosclerotic heart disease of native coronary artery without angina pectoris: Secondary | ICD-10-CM

## 2016-11-15 NOTE — Telephone Encounter (Signed)
Left message on patients voice mail. Dr Dohmeier will be out of the office on Thurs afternoon and needed to discuss another time for the pt to be seen. She can work him in on 4 pm slot on Monday or Tues. Will await his call to decide what works best for him.

## 2016-11-15 NOTE — Telephone Encounter (Signed)
Apt change made. Thanks to pt for being understanding.

## 2016-11-15 NOTE — Telephone Encounter (Signed)
Patients wife called back returning your call to reschedule appointment for Thursday afternoon.  She would like to have him scheduled for Monday August 13th at 4:00pm arrive at 3:30pm.  Will you please reschedule him to this date/time due to time slot being blocked. Thanks

## 2016-11-16 ENCOUNTER — Encounter: Payer: Self-pay | Admitting: Neurology

## 2016-11-16 NOTE — Telephone Encounter (Signed)
Referral has been sent to New Patient coordinator's for scheduling .

## 2016-11-17 ENCOUNTER — Ambulatory Visit: Payer: Medicare Other | Admitting: Neurology

## 2016-11-17 ENCOUNTER — Telehealth: Payer: Self-pay | Admitting: Neurology

## 2016-11-17 NOTE — Telephone Encounter (Signed)
Will print this report and have it ready to review with the patient at apt on Monday.

## 2016-11-17 NOTE — Telephone Encounter (Signed)
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  Brain atrophy and extensive small vessel disease. I will refer to stroke team for further work up. CD  Primary care Dr. Rubye OaksPalmer is not on Epic, please share MRI report. CD.

## 2016-11-21 ENCOUNTER — Encounter: Payer: Self-pay | Admitting: Neurology

## 2016-11-21 ENCOUNTER — Ambulatory Visit (INDEPENDENT_AMBULATORY_CARE_PROVIDER_SITE_OTHER): Payer: Medicare Other | Admitting: Neurology

## 2016-11-21 VITALS — BP 91/60 | HR 74 | Ht 69.0 in | Wt 215.0 lb

## 2016-11-21 DIAGNOSIS — I63511 Cerebral infarction due to unspecified occlusion or stenosis of right middle cerebral artery: Secondary | ICD-10-CM

## 2016-11-21 MED ORDER — ASPIRIN-DIPYRIDAMOLE ER 25-200 MG PO CP12: 1.0000 | ORAL_CAPSULE | Freq: Two times a day (BID) | ORAL | 5 refills | Status: DC

## 2016-11-21 MED ORDER — ACETAMINOPHEN 325 MG PO TABS: ORAL_TABLET | ORAL | 5 refills | Status: DC

## 2016-11-21 NOTE — Patient Instructions (Signed)

## 2016-11-21 NOTE — Progress Notes (Signed)
SLEEP MEDICINE CLINIC   Provider:  Melvyn Novas, M D  Referring Provider: Myrtis Ser., MD Primary Care Physician:  Gertie Gowda, MD  Chief Complaint  Patient presents with  . Follow-up    HPI:  [name] is a 71 y.o., married, caucasian , right handed  male , who is seen here in a RV.  Interval history from 11/21/2016. [name] returns today after his MRI from 11/16/2016 documented a fresh, acute right sided brain stroke. In addition the interpretation by Dr. Purvis Sheffield suggested the presence of multiple lacunar infarcts throughout the left brain hemisphere. Main risk factor for these blood colons his hypertension, the main risk factor for his right-sided brain stroke is an vasogenic embolism. The still need to check out that there is no blood clot sitting in his heart, we will order an power Doppler study, if this is negative I would suggest a cardiac monitor to rule out A. fib and I suggest to change him for now on Plavix and aspirin to Aggrenox. Patients on Aggrenox which is take it twice a day, often develop headaches, I usually premedicate with Tylenol.    He still resides in [place], and he comes back after 2.5 years . He wears hearing aids, and uses a seated walker. He was followed in the sleep clinic 2015 , but needs now  a memory evaluation , he felt. In the mean time he has developed kidney stones, urinary dribbling, sudden gait disturbance and his wife felt there was a sudden decline- in late April 2018  He continues to use oxygen instead of using CPAP - which he could not tolerate.  His brother in law, an internist, suspected NPH could be present. He has been seen at [facility] in [place], [place] and had back MRIs there.      06-2013 original referral  from Dr. Jacinto Halim and NP Clemon Chambers for a sleep evaluation,Mr. Hughett was referred after developing angina pectoris with known coronary artery disease, hearing loss and status post CABG . He had a coronary  angiogram on 01-01-13 documenting kinesis of the posterior basal segment of the heart with an EF of 40%, mild mitral regurgitation.Right dominant circulation or carotid proximal circumflex and TG toward ICA occluded as well. Diffuse disease in other parts.  The saphenous vein graft from 1996 when he underwent CABG shows some diffuse disease as well. The patient continues to take all antiplatelet therapy as an HbA1c of 5.3,  a total cholesterol of 156.  Additional risk factor management we'll address the possibility of the patient having obstructive sleep apnea.  Dr. Lewis Moccasin had ordered a nocturnal pulse oximetry,  which documented 16 minutes at or below 88% saturation , and 31 minutes of less than 89% saturation - desaturation events were 10/hr.  The patient was started by Dr. Reece Agar. on oxygen supplementation at 2-1/2 L per minute. The patient states that he feels the best all day close to his bed-time, in return delays going to bed frequently.  Bedtime is  2 AM and he will need 1 to 60 minutes to go to sleep. He takes 2 xanax every night,  Has done for decades.  He used to nap well in his recliner, but now wakes up with angina. He is concerned about his angina when asleep in supine. He goes to sleep on his side, or prone, but wakes up on his back.  He has chronic nasal congestion, he has undergone a septal deviation repair but  remains restricted in nasal airflow. He has a mustache. He stopped smoking October 20 1983, after smoking since 1962. He rarely drinks ETOH.  He believes he had never had a stroke, but underwent a Carotid End arterectomy  on the left with a " TIA" , January 2015.  The patient lives In [place] , [place].   01-15-14 Interval history :  [name] returns after his sleep study , which he found enjoyable. The sleep study took place on 01-01-14 the patient had endorsed CPAP for a score of at 6 points and the fatigue score at 48 points. He is also endorsed the pH Q8 14 points which indicates  depression. His AHI was 7.5 which is truly a mild degree his RDI 8.4 in rem sleep his apnea index increased is 33 the pylorus sleep. His lowest oxygen saturation was 82% and is a total time of 15.4 minutes. His heart rate was irregular. I suggested that there could be two treatment options.  #1  REM sleep suppressant medication that would decrease apnea since it was REM dependent and off on the CPAP treatment as longer necessary.  Patient chose cymbalta .   #2 if the patient is loudly snoring and feels that his respiratory disturbance he is high CPAP should be attempted at a lower pressure. REM dependent apnea, patient would like to treat with REM supressent medication. In this case he started Cymbalta.  CO2 retention. Patient is on Oxygen at home, continue O2 supplemented at 2.5 litres. Dr Jacinto Halim prescriber . He continues cardio rehab, Interval history to-10-16, the patient reports that the Cymbalta has helped him to sleep deeper and sounder. Cymbalta is used as a REM sleep suppressant in his case as he had REM dependent sleep apnea. His wife added that he is also less panicky less anxious. He noticed less anxiety. He still sleeps sounder and falls asleep quicker than he used to. Sleep latency is now less than an hour and maybe 30 minutes. He will have one nocturia.He does not have any headaches in the morning.    Review of Systems: Out of a complete 14 system review, the patient complains of only the following symptoms, and all other reviewed systems are negative. Sleepiness , fatigue, hearing loss,  Dysphonia of voice. Dysphagia - Gait  disorder- thought to be orthopedic.  forgetfulnes- now nocturia.  Epworth score 8 , Fatigue severity score 49  , depression score 5/ 15    Social History   Social History  . Marital status: Married    Spouse name: [name]  . Number of children: 0  . Years of education: Grad. scho   Occupational History  . Not on file.   Social History Main Topics  .  Smoking status: Former Smoker    Quit date: 10/20/1983  . Smokeless tobacco: Never Used  . Alcohol use Yes     Comment: occas.  . Drug use: No  . Sexual activity: Not on file   Other Topics Concern  . 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   Patient is retired.   Patient has a Naval architect.   Patient is right-handed.   Patient drinks 3-4 cups of coffee and three cups of soda/tea per day.    Past Medical History:  Diagnosis Date  . CAD (coronary artery disease) of artery bypass graft 12/09/2013  . Coronary artery disease   . Diabetes mellitus   .  Hearing loss    wears bilateral hearing aids  . History of nephrolithiasis   . Hypertension   . Kidney stone   . MI (myocardial infarction) (HCC)    (several)    Past Surgical History:  Procedure Laterality Date  . broken wrist surgery Left 2001  . CAROTID ENDARTERECTOMY    . CORONARY ARTERY BYPASS GRAFT     5 vessels  . CORONARY ARTERY BYPASS GRAFT    . KIDNEY STONE SURGERY    . WRIST SURGERY      Current Outpatient Prescriptions  Medication Sig Dispense Refill  . ALPRAZolam (XANAX) 0.5 MG tablet Take 0.5 mg by mouth at bedtime as needed.      Marland Kitchen aspirin 81 MG tablet Take 81 mg by mouth daily.    Marland Kitchen atorvastatin (LIPITOR) 10 MG tablet Take 10 mg by mouth daily.    . carvedilol (COREG) 25 MG tablet Take 25 mg by mouth 2 (two) times daily with a meal.    . Clopidogrel Bisulfate (PLAVIX PO) Take 75 mg by mouth daily.     . DULoxetine (CYMBALTA) 20 MG capsule Take 60 mg by mouth daily.     . FENOFIBRATE PO Take 160 mg by mouth daily.     . ISOSORBIDE DINITRATE PO Take 90 mg by mouth daily.     Marland Kitchen LINZESS 290 MCG CAPS capsule 1 CAP(S) 30 MINUTES BEFORE BREAKFAST ORALLY 30 DAY(S)  11  . LOSARTAN POTASSIUM PO Take 25 mg by mouth daily.     . Magnesium 250 MG TABS Take 1 tablet by mouth 2 (two) times daily.    . Multiple Vitamins-Minerals (MULTIVITAMIN  PO) Take 1 tablet by mouth daily.    . nitroGLYCERIN (NITROSTAT) 0.4 MG SL tablet Place 1 tablet (0.4 mg total) under the tongue every 5 (five) minutes as needed for chest pain. 25 tablet 2  . Omega-3 Fatty Acids (FISH OIL) 1000 MG CAPS Take 1 capsule by mouth 2 (two) times daily.    . ranolazine (RANEXA) 1000 MG SR tablet Take 1,000 mg by mouth 2 (two) times daily.     . tamsulosin (FLOMAX) 0.4 MG CAPS capsule Take 0.4 mg by mouth daily.  0   No current facility-administered medications for this visit.     Allergies as of 11/21/2016 - Review Complete 11/21/2016  Allergen Reaction Noted  . Penicillins  03/14/2011    Vitals: BP 91/60   Pulse 74   Ht 5\' 9"  (1.753 m)   Wt 215 lb (97.5 kg)   BMI 31.75 kg/m  Last Weight:  Wt Readings from Last 1 Encounters:  11/21/16 215 lb (97.5 kg)       Last Height:   Ht Readings from Last 1 Encounters:  11/21/16 5\' 9"  (1.753 m)    Physical exam:  General: The patient is awake, alert and appears not in acute distress.  Head: Normocephalic, atraumatic. Neck is supple. Mallampati 3 with a short airway, elongate uvula and tonsils, his uvula deviated to the left.   neck circumference: 17 ". Nasal airflow restricted , TMJ is not  evident . Retrognathia is not seen.  Cardiovascular:  Regular rate and rhyth, without  murmurs or carotid bruit, and without distended neck veins. Respiratory: Lungs are clear to auscultation. Skin:  Without evidence of edema, or rash Trunk: BMI is elevated and patient  has normal posture.  Neurologic exam : The patient is awake and alert, oriented to place and time.   Memory subjective  described  as intact. There is a normal attention span & concentration ability. Speech is fluent with dysphonia . Mood and affect are appropriate. MMSE - Mini Mental State Exam 10/24/2016  Orientation to time 4  Orientation to time comments stated wrong date. 15th instead of 16th  Orientation to Place 5  Registration 3  Attention/  Calculation 5  Recall 3  Language- name 2 objects 2  Language- repeat 1  Language- follow 3 step command 3  Language- read & follow direction 1  Write a sentence 1  Copy design 1  Total score 29   27/30 - missed 2 out of 3 recall words.   Cranial nerves:Pupils are equal and briskly reactive to light. Visual fields by finger perimetry are intact. Hearing to finger rub intact.  Facial sensation intact to fine touch.  Facial motor strength : has a droop of the right face and equal tongue  movement. Equal shoulder shrug  Motor exam:  Elevated tone on the right arm, pronator drift and dysmetria. - this was present in 2015  Gait and station: Patient walks with a walker- that was not the case when last seen.   Deep tendon reflexes: in the  upper and lower extremities are symmetric and intact. Babinski maneuver response is downgoing.  STUDY DATE: 11/14/2016 PATIENT NAME: [name] DOB: Apr 09, 1946 MRN: [id number]  EXAM: MRI Brain without contrast  ORDERING CLINICIAN: Porfirio Mylar Kayston Jodoin [name] CLINICAL HISTORY: 71 year old man with possible TIA or NPH COMPARISON FILMS: none  TECHNIQUE: MRI of the brain without contrast was obtained utilizing 5 mm axial slices with T1, T2, T2 flair, SWI and diffusion weighted views.  T1 sagittal and T2 coronal views were obtained. CONTRAST: none IMAGING SITE: [facility], [address] [place], [place]  FINDINGS: On sagittal images, the spinal cord is imaged caudally to C3 and is normal in caliber.   The contents of the posterior fossa are of normal size and position.   The pituitary gland and optic chiasm appear normal.    The third and lateral ventricles are enlarged, in proportion to the extent of moderate cortical atrophy.   There are a few small T2/FLAIR hyperintense foci in the right cerebellum consistent with chronic microvascular ischemic foci..   The deep gray matter appears normal.  In the hemispheres, there are multiple T2/FLAIR  hyperintense foci in the deep and subcortical white matter. Chronic lacunar infarctions are noted adjacent to the left  basal ganglia and in the left centrum semiovale. There is a cortically based focus in the posterior right temporal lobe that is hyperintense on diffusion-weighted images implying a more recent occurrence.   Confluent chronic foci are noted in the periatrial white matter.  Susceptibility weighted images are normal.    The orbits appear normal.   The VIIth/VIIIth nerve complex appears normal.  The mastoid air cells appear normal.  Mucoperiosteal thickening is noted within a couple of the ethmoid air cells. The other paranasal sinuses appear normal.  Flow voids are identified within the major intracerebral arteries.      IMPRESSION:  This MRI of the brain without contrast shows the following: 1.    Moderate cortical atrophy with compensatory ventricular enlargement. 2.    Acute peripheral ischemic focus in the posterior right temporal lobe that is hyperintense on diffusion-weighted images. 3.    Extensive extent of foci in the hemispheres and a couple in the cerebellum consistent with chronic microvascular ischemic changes.  There are confluent foci in the periatrial white matter. There are lacunar  infarctions noted adjacent to the left basal ganglia and in the left centrum semiovale.     INTERPRETING PHYSICIAN:  Richard A. 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  Brain atrophy and extensive small vessel disease. I will refer to stroke team for further work up. CD  Primary care Dr. Rubye OaksPalmer is not on Epic, please share MRI report. CD.      Orders Requiring a Screening Form   Procedure Order Status Form Status  MR BRAIN WO CONTRAST Completed Completed  Vitals     Assessment:   After physical and neurologic examination, review of laboratory studies, imaging, neurophysiology testing and pre-existing records, assessment is   1)new stroke - right - accounts  for the gait instability, confusion/ memory loss and his urinary problems. His stroke could be off arousal embolic origin, he has an echocardiogram scheduled with Dr. Adelfa KohGunn G and 14 days, I would love to have an additional carotid ultrasound study, and to discuss change to Aggrenox from the current medication regimen of Plavix and aspirin.   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 We used Cymbalta as a REM Supressant. He uses Oxygen at home for the last 2.5 years 4) He likely had a previous stroke and not a TIA in January 2015. MRI confirmed this- he has lacunes throughout the left brain.  New stroke risk. risk based on  EF 40 % , CHF due to CAD , dyskinetic Heart. 5)   this is short term memory loss - vascular dementia ?     Prolonged Re- Visit duration was 45 minutes.   Plan:  Treatment plan and additional workup :    Stroke prevention- will ask Dr Jacinto HalimGanji to arrange for the doppler and Echo and  Cardiac monitor-I suggest aggrenox.    Porfirio Mylararmen Johneric Mcfadden MD  11/21/2016

## 2016-11-23 ENCOUNTER — Ambulatory Visit (INDEPENDENT_AMBULATORY_CARE_PROVIDER_SITE_OTHER): Payer: Medicare Other | Admitting: Diagnostic Neuroimaging

## 2016-11-23 ENCOUNTER — Encounter: Payer: Self-pay | Admitting: Diagnostic Neuroimaging

## 2016-11-23 VITALS — BP 92/56 | HR 75 | Ht 69.0 in | Wt 215.5 lb

## 2016-11-23 DIAGNOSIS — R413 Other amnesia: Secondary | ICD-10-CM

## 2016-11-23 DIAGNOSIS — I63411 Cerebral infarction due to embolism of right middle cerebral artery: Secondary | ICD-10-CM

## 2016-11-23 NOTE — Patient Instructions (Signed)
  EMBOLIC STROKE WORKUP - check TTE; if negative consider TEE and implanted loop recorder (per Dr. Jacinto HalimGanji) - check MRA head / neck - continue aspirin + plavix (has been on this for ~ 20-30 years); no significant advantage to switch aggrenox at this time - continue atorvastatin and fish oil  HYPOTENSION (low blood pressure) - on coreg and losartan - follow up with Dr. Jacinto HalimGanji for medication adjustment; may need to be slightly reduced  MEMORY LOSS + BRAIN ATROPHY - follow up with Dr. Vickey Hugerohmeier in ~ 2-3 months

## 2016-11-23 NOTE — Progress Notes (Signed)
[facility]  PATIENT: [name] DOB: 05/03/1945  REFERRING CLINICIAN: C Dohmeier HISTORY FROM: patient and wife and chart review REASON FOR VISIT: new consult / second opinion   HISTORICAL  CHIEF COMPLAINT:  Chief Complaint  Patient presents with  . Cerebrovascular Accident    rm 6, New Pt, wife-[name]   . Follow-up    for further stroke work up    HISTORY OF PRESENT ILLNESS:   71 year old male with hypertension, hypercholesteremia, coronary artery disease, anxiety, history of left carotid endarterectomy, here for evaluation of stroke. This is a second opinion consult request from Dr. Vickey Hugerohmeier re: stroke workup and secondary stroke prevention.  Approximate 6-8 weeks ago patient had sudden decline in cognitive ability, balance and gait. Patient presented to neurologist for evaluation and MRI of the brain was ordered. MRI showed acute to subacute right posterior temporal ischemic infarction as well as chronic small vessel ischemic disease.  Patient having more trouble with remembering dates, time, location of common places ([place] [employer] [employer] [place]  During this evaluation patient remains fairly quiet and his wife does most of the talking. He does not feel that he is having any significant problem. When asked about any other cognitive impairment going back further greater than 1-2 years patient and wife deny this.   Patient denies any unilateral numbness, weakness, vision changes. He has had mild fluctuating slurred speech over last several weeks, as well as some slurred speech going back further.  Patient currently on aspirin and Plavix, which she has been on for last 20-30 years related to his coronary artery disease. He is also on carvedilol and losartan for heart and blood pressure issues. His blood pressure has been fairly low recently and he does report some lightheadedness and dizziness.    REVIEW OF SYSTEMS: Full 14 system review of  systems performed and negative with exception of:  Anxiety not asleep decreased energy racing thoughts memory loss confusion headache insomnia sleepiness easy bruising easy bleeding fatigue chest pain swelling in legs hearing loss or as of breath wheezing diarrhea impotence.    ALLERGIES: Allergies  Allergen Reactions  . Penicillins     HOME MEDICATIONS: Outpatient Medications Prior to Visit  Medication Sig Dispense Refill  . acetaminophen (TYLENOL) 325 MG tablet Premedication with tylenol  Po before taking Aggrenox. 60 tablet 5  . ALPRAZolam (XANAX) 0.5 MG tablet Take 0.5 mg by mouth at bedtime as needed.      Marland Kitchen. atorvastatin (LIPITOR) 10 MG tablet Take 10 mg by mouth daily.    . carvedilol (COREG) 25 MG tablet Take 25 mg by mouth 2 (two) times daily with a meal.    . DULoxetine (CYMBALTA) 20 MG capsule Take 60 mg by mouth daily.     . FENOFIBRATE PO Take 160 mg by mouth daily.     . ISOSORBIDE DINITRATE PO Take 90 mg by mouth daily.     Marland Kitchen. LINZESS 290 MCG CAPS capsule 1 CAP(S) 30 MINUTES BEFORE BREAKFAST ORALLY 30 DAY(S)  11  . LOSARTAN POTASSIUM PO Take 25 mg by mouth daily.     . Magnesium 250 MG TABS Take 1 tablet by mouth 2 (two) times daily.    . Multiple Vitamins-Minerals (MULTIVITAMIN PO) Take 1 tablet by mouth daily.    . nitroGLYCERIN (NITROSTAT) 0.4 MG SL tablet Place 1 tablet (0.4 mg total) under the tongue every 5 (five) minutes as needed for chest pain. 25 tablet 2  . Omega-3 Fatty Acids (FISH  OIL) 1000 MG CAPS Take 1 capsule by mouth 2 (two) times daily.    . ranolazine (RANEXA) 1000 MG SR tablet Take 1,000 mg by mouth 2 (two) times daily.     Marland Kitchen dipyridamole-aspirin (AGGRENOX) 200-25 MG 12hr capsule Take 1 capsule by mouth 2 (two) times daily. (Patient not taking: Reported on 11/23/2016) 60 capsule 5  . tamsulosin (FLOMAX) 0.4 MG CAPS capsule Take 0.4 mg by mouth daily.  0   No facility-administered medications prior to visit.     PAST MEDICAL HISTORY: Past Medical  History:  Diagnosis Date  . CAD (coronary artery disease) of artery bypass graft 12/09/2013  . Coronary artery disease   . Diabetes mellitus   . Hearing loss    wears bilateral hearing aids  . History of nephrolithiasis   . Hypertension   . Kidney stone   . MI (myocardial infarction) (HCC) 1985   (several)  . Stroke [facility])     PAST SURGICAL HISTORY: Past Surgical History:  Procedure Laterality Date  . AV FISTULA REPAIR  2005  . broken wrist surgery Left 2001  . CAROTID ENDARTERECTOMY    . CORONARY ARTERY BYPASS GRAFT  1996   5 vessels  . KIDNEY STONE SURGERY    . WRIST SURGERY      FAMILY HISTORY: Family History  Problem Relation Age of Onset  . Heart disease Mother   . Liver cancer Unknown   . Parkinson's disease Unknown   . Heart disease Brother   . Parkinsonism Sister   . Cancer Sister     SOCIAL HISTORY:  Social History   Social History  . Marital status: Married    Spouse name: [name]  . Number of children: 0  . Years of education: Grad. scho   Occupational History  . Not on file.   Social History Main Topics  . Smoking status: Former Smoker    Quit date: 10/20/1983  . Smokeless tobacco: Never Used  . Alcohol use Yes     Comment: occas.  . Drug use: No  . Sexual activity: Not on file   Other Topics Concern  . 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   Patient is retired.   Patient has a Naval architect.   Patient is right-handed.   Patient drinks 3-4 cups of coffee and three cups of soda/tea per day.     PHYSICAL EXAM  GENERAL EXAM/CONSTITUTIONAL: Vitals:  Vitals:   11/23/16 1121 11/23/16 1131  BP: (!) 86/49 (!) 92/56  Pulse: 67 75  Weight: 215 lb 8 oz (97.8 kg)   Height: 5\' 9"  (1.753 m)      Body mass index is 31.82 kg/m.  No exam data present  Patient is in no distress; well developed, nourished and groomed; neck is  supple  CARDIOVASCULAR:  Examination of carotid arteries is normal; no carotid bruits  Regular rate and rhythm, BLOWING SYSTOLIC MURMUR  Examination of peripheral vascular system by observation and palpation is normal  EYES:  Ophthalmoscopic exam of optic discs and posterior segments is normal; no papilledema or hemorrhages  MUSCULOSKELETAL:  Gait, strength, tone, movements noted in Neurologic exam below  NEUROLOGIC: MENTAL STATUS:  MMSE - Mini Mental State Exam 10/24/2016  Orientation to time 4  Orientation to time comments stated wrong date. 15th instead of 16th  Orientation to Place 5  Registration 3  Attention/ Calculation 5  Recall 3  Language- name 2 objects 2  Language- repeat 1  Language- follow 3 step command 3  Language- read & follow direction 1  Write a sentence 1  Copy design 1  Total score 29    awake, alert, oriented to person, place and time  recent and remote memory intact  normal attention and concentration  language fluent, comprehension intact, naming intact,   fund of knowledge appropriate  CRANIAL NERVE:   2nd - no papilledema on fundoscopic exam  2nd, 3rd, 4th, 6th - pupils equal and reactive to light, visual fields full to confrontation, extraocular muscles intact, no nystagmus  5th - facial sensation symmetric  7th - facial strength --> SLIGHTLY DECR RIGHT LOWER STRENGTH ON SMILE  8th - hearing intact  9th - palate elevates symmetrically, uvula midline  11th - shoulder shrug symmetric  12th - tongue protrusion midline  MILD DYSARTHRIA  MOTOR:   normal bulk and tone, full strength in the BUE, BLE  SENSORY:   normal and symmetric to light touch,  temperature, vibration  COORDINATION:   finger-nose-finger, fine finger movements SLOW  REFLEXES:   deep tendon reflexes 2+ and symmetric; ANKLES 1+  GAIT/STATION:   narrow based gait; USES ROLLATOR WALKER    DIAGNOSTIC DATA (LABS, IMAGING, TESTING) - I reviewed  patient records, labs, notes, testing and imaging myself where available.  Lab Results  Component Value Date   WBC 6.3 10/24/2016   HGB 13.2 10/24/2016   HCT 38.9 10/24/2016   MCV 92 10/24/2016   PLT 240 10/24/2016      Component Value Date/Time   NA 140 10/24/2016 1149   K 5.6 (H) 10/24/2016 1149   CL 99 10/24/2016 1149   CO2 24 10/24/2016 1149   GLUCOSE 124 (H) 10/24/2016 1149   BUN 22 10/24/2016 1149   CREATININE 0.99 10/24/2016 1149   CALCIUM 10.1 10/24/2016 1149   PROT 6.8 10/24/2016 1149   ALBUMIN 4.1 10/24/2016 1149   AST 23 10/24/2016 1149   ALT 22 10/24/2016 1149   ALKPHOS 39 10/24/2016 1149   BILITOT 0.3 10/24/2016 1149   GFRNONAA 75 10/24/2016 1149   GFRAA 87 10/24/2016 1149   No results found for: CHOL, HDL, LDLCALC, LDLDIRECT, TRIG, CHOLHDL No results found for: ZOXW9U No results found for: VITAMINB12 No results found for: TSH   11/14/16 MRI brain [I reviewed images myself and agree with interpretation. Except I think the stroke is subacute because it is isointense on ADC and hyperintense on T2FLAIR; also there is severe left and moderate right perisylvian atrophy. -VRP]  1.    Moderate cortical atrophy with compensatory ventricular enlargement. 2.    Acute peripheral ischemic focus in the posterior right temporal lobe that is hyperintense on diffusion-weighted images. 3.    Extensive extent of foci in the hemispheres and a couple in the cerebellum consistent with chronic microvascular ischemic changes.  There are confluent foci in the periatrial white matter. There are lacunar infarctions noted adjacent to the left basal ganglia and in the left centrum semiovale.     ASSESSMENT AND PLAN  71 y.o. year old male here w6-8 weeks of sudden memory loss and cognitive change, balance and gait difficulty, dizziness. Patient found to have acute to subacute right posterior temporal ischemic infarction which appears embolic on MRI brain. Will complete stroke  workup.   Ddx: embolic stroke + memory loss + hypotension  1. Memory loss   2. Cerebrovascular accident (CVA) due to embolism of right middle cerebral artery (HCC)  PLAN:  EMBOLIC STROKE WORKUP (right posterior temporal; cortical) - check TTE; if negative consider TEE and implanted loop record (per Dr. Jacinto Halim) - check MRA head / neck - continue aspirin + plavix (has been on this for ~ 20-30 years); no significant advantage to switch aggrenox at this time - continue atorvastatin and fish oil  HYPOTENSION - on coreg and losartan - follow up with Dr. Jacinto Halim for medication adjustment; may need to be slightly reduced  MEMORY LOSS + BRAIN ATROPHY (anxiety, hypoxemia / snoring, MCI vs mild dementia [vascular dementia, primary progressive aphasia, alzheimer's dementia])  - follow up with Dr. Vickey Huger in ~ 2-3 months  Orders Placed This Encounter  Procedures  . MR MRA HEAD WO CONTRAST  . MR MRA NECK W WO CONTRAST   Return in about 2 months (around 01/23/2017) for return to Dr. Vickey Huger .  I reviewed images, labs, notes, records myself. I summarized findings and reviewed with patient, for this high risk condition (stroke, memory loss, hypotension) requiring high complexity decision making.     Suanne Marker, MD 11/23/2016, 12:16 PM Certified in Neurology, Neurophysiology and Neuroimaging  [facility] [address], [address] [place], [place] [address] [phone]

## 2016-12-06 ENCOUNTER — Ambulatory Visit
Admission: RE | Admit: 2016-12-06 | Discharge: 2016-12-06 | Disposition: A | Discharge status: Home / Self Care | Payer: Medicare Other | Source: Ambulatory Visit | Attending: Diagnostic Neuroimaging | Admitting: Diagnostic Neuroimaging

## 2016-12-06 DIAGNOSIS — R413 Other amnesia: Secondary | ICD-10-CM

## 2016-12-06 DIAGNOSIS — I63411 Cerebral infarction due to embolism of right middle cerebral artery: Secondary | ICD-10-CM | POA: Diagnosis not present

## 2016-12-06 MED ORDER — GADOBENATE DIMEGLUMINE 529 MG/ML IV SOLN
20.0000 mL | Freq: Once | INTRAVENOUS | Status: AC | PRN
  Administered 2016-12-06: 20 mL via INTRAVENOUS

## 2016-12-16 ENCOUNTER — Telehealth: Payer: Self-pay | Admitting: Diagnostic Neuroimaging

## 2016-12-16 NOTE — Telephone Encounter (Signed)
I called [name]'s home but got only an answering machine- Results for MRA head and neck . His medication list is still stating he uses Aggrenox ( I had started that on 8-14) , but his wife told me that [name] changed him back to ASA and Plavix.  He has remained on this medication. They expect the results of echo and MRA to help with a decision to change to Eloquis or similar - or not.  In the meantime, [name] has done the ECHO, but no results in EPIC. Called [name]'s office and ECHO results will be faxed to [facility] this PM.   Upon second call, his wife answered and I asked her to keep husband on current meds until Monday. She was not upset, just concerned that someone will pull the results together.   CD

## 2016-12-16 NOTE — Telephone Encounter (Signed)
Pt's wife called request imaging results and echocardiogram results. Patient's wife is unhappy they have not rec'd a call regarding test results. Please call

## 2016-12-19 NOTE — Telephone Encounter (Signed)
I spoke to [name] and advised her of Dr Marlis EdelsonSethi's recommendation. Keep on double platelet therapy for 2-3 month and than single antiplatelet therapy. No source of embolism identified in cardiac Echo. May need a cardiac monitoring with Dr. Jacinto HalimGanji to make sure he is not in and out of atrial fib. RV appointment for [name] with me is in early October. CD

## 2016-12-22 ENCOUNTER — Telehealth: Payer: Self-pay | Admitting: Neurology

## 2016-12-22 NOTE — Telephone Encounter (Signed)
Dr Yates DecampJay Ganji will set up a 30 day event monitor for [facility]. CD

## 2016-12-22 NOTE — Telephone Encounter (Signed)
-----   Message from Yates DecampJay Ganji, MD sent at 12/20/2016 12:58 PM EDT ----- Regarding: Event I can set up event monitor for 30 days to exclude A. FIb and see him in the office. Liz MaladyJG :: [phone] ----- Message ----- From: Melvyn Novasohmeier, Jianni Shelden, MD Sent: 12/19/2016   4:10 PM To: Yates DecampJay Ganji, MD  Dear Vonna KotykJay,   I will keep this patient on double antiplatelet therapy, for another 6-8 weeks. Do you recommend a cardiac montioring for ruling in/ out a fib. ?   Greetings from [employer],  [employer]

## 2016-12-22 NOTE — Telephone Encounter (Signed)
Called and spoke with the patients wife. They have set this up.

## 2017-01-10 ENCOUNTER — Ambulatory Visit: Payer: Medicare Other | Admitting: Nurse Practitioner

## 2017-02-09 ENCOUNTER — Ambulatory Visit: Payer: Medicare Other | Admitting: Neurology

## 2017-02-24 ENCOUNTER — Encounter (INDEPENDENT_AMBULATORY_CARE_PROVIDER_SITE_OTHER): Payer: Self-pay

## 2017-02-24 ENCOUNTER — Encounter: Payer: Self-pay | Admitting: Neurology

## 2017-02-24 ENCOUNTER — Ambulatory Visit: Payer: Medicare Other | Admitting: Neurology

## 2017-02-24 VITALS — BP 102/58 | HR 72 | Ht 69.0 in | Wt 225.0 lb

## 2017-02-24 DIAGNOSIS — G3184 Mild cognitive impairment, so stated: Secondary | ICD-10-CM | POA: Diagnosis not present

## 2017-02-24 DIAGNOSIS — I639 Cerebral infarction, unspecified: Secondary | ICD-10-CM | POA: Diagnosis not present

## 2017-02-24 DIAGNOSIS — R0902 Hypoxemia: Secondary | ICD-10-CM

## 2017-02-24 DIAGNOSIS — I25701 Atherosclerosis of coronary artery bypass graft(s), unspecified, with angina pectoris with documented spasm: Secondary | ICD-10-CM

## 2017-02-24 DIAGNOSIS — Z8673 Personal history of transient ischemic attack (TIA), and cerebral infarction without residual deficits: Secondary | ICD-10-CM

## 2017-02-24 MED ORDER — ALPRAZOLAM 0.5 MG PO TABS: 0.5000 mg | ORAL_TABLET | Freq: Every evening | ORAL | 0 refills | Status: AC | PRN

## 2017-02-24 NOTE — Addendum Note (Signed)
Addended by: Melvyn NovasHMEIER, Donnel Venuto on: 02/24/2017 12:25 PM   Modules accepted: Orders

## 2017-02-24 NOTE — Progress Notes (Addendum)
SLEEP MEDICINE CLINIC   Provider:  Melvyn Novas, M D  Referring Provider: Gertie Gowda, MD Primary Care Physician:  Gertie Gowda, MD  Chief Complaint  Patient presents with  . Follow-up    memory, with wife, rm 10    HPI:  [name] is a 71 y.o., married, caucasian , right handed  male , who is seen here in a RV.   I have the pleasure of seeing [name] here today on 24 February 2017 in a revisit.  The patient was diagnosed in August 2018 with a stroke and acute peripheral ischemic focus in the posterior right temporal lobe.  He also had microvascular changes.  Moderate cortical atrophy was noted.  Compensatory ventricular enlargement was seen. MRA showed left internal carotid artery normal flow both vertebral arteries were with antegrade flow left common carotid artery and bifurcation were patent, all appear to be patent.  Was performed on 06 December 2016. Cognitive evaluation MMSE 28/30 points. He should change to [facility] next time. He walk with a walker.  The patient is still on the same medication -  Aggrenox,  patient indicated he wanted to try a CPAP, instead of just o2. He needs another study- SPLIT study.     Interval history from 11/21/2016. [name] returns today after his MRI from 11/16/2016 documented a fresh, acute right sided brain stroke. In addition the interpretation by Dr. Purvis Sheffield suggested the presence of multiple lacunar infarcts throughout the left brain hemisphere. Main risk factor for these blood colons his hypertension, the main risk factor for his right-sided brain stroke is an vasogenic embolism. The still need to check out that there is no blood clot sitting in his heart, we will order an power Doppler study, if this is negative I would suggest a cardiac monitor to rule out A. fib and I suggest to change him for now on Plavix and aspirin to Aggrenox. Patients on Aggrenox which is take it twice a day, often develop headaches, I usually premedicate  with Tylenol.  He still resides in [facility], and he comes back after 2.5 years . He wears hearing aids, and uses a seated walker. He was followed in the sleep clinic 2015 , but needs now  a memory evaluation , he felt. In the mean time he has developed kidney stones, urinary dribbling, sudden gait disturbance and his wife felt there was a sudden decline- in late April 2018  He continues to use oxygen instead of using CPAP - which he could not tolerate.  His brother in law, an internist, suspected NPH could be present. He has been seen at [facility] in [place], [place] and had back MRIs there.      [id number] original referral  from Dr. Jacinto Halim and NP Clemon Chambers for a sleep evaluation,[name] was referred after developing angina pectoris with known coronary artery disease, hearing loss and status post CABG . He had a coronary angiogram on 01-01-13 documenting kinesis of the posterior basal segment of the heart with an EF of 40%, mild mitral regurgitation.Right dominant circulation or carotid proximal circumflex and TG toward ICA occluded as well. Diffuse disease in other parts.  The saphenous vein graft from 1996 when he underwent CABG shows some diffuse disease as well. The patient continues to take all antiplatelet therapy as an HbA1c of 5.3,  a total cholesterol of 156.  Additional risk factor management we'll address the possibility of the patient having obstructive sleep apnea.  Dr. Lewis Moccasin  had ordered a nocturnal pulse oximetry,  which documented 16 minutes at or below 88% saturation , and 31 minutes of less than 89% saturation - desaturation events were 10/hr.  The patient was started by Dr. Reece Agar. on oxygen supplementation at 2-1/2 L per minute. The patient states that he feels the best all day close to his bed-time, in return delays going to bed frequently.  Bedtime is  2 AM and he will need 1 to 60 minutes to go to sleep. He takes 2 xanax every night,  Has done for decades.  He used to nap well in  his recliner, but now wakes up with angina. He is concerned about his angina when asleep in supine. He goes to sleep on his side, or prone, but wakes up on his back.  He has chronic nasal congestion, he has undergone a septal deviation repair but remains restricted in nasal airflow. He has a mustache. He stopped smoking October 20 1983, after smoking since 1962. He rarely drinks ETOH.  He believes he had never had a stroke, but underwent a Carotid End arterectomy  on the left with a " TIA" , January 2015.  The patient lives In [place] , [place].   01-15-14 Interval history :  [name] returns after his sleep study , which he found enjoyable. The sleep study took place on 01-01-14 the patient had endorsed CPAP for a score of at 6 points and the fatigue score at 48 points. He is also endorsed the pH Q8 14 points which indicates depression. His AHI was 7.5 which is truly a mild degree his RDI 8.4 in rem sleep his apnea index increased is 33 the pylorus sleep. His lowest oxygen saturation was 82% and is a total time of 15.4 minutes. His heart rate was irregular. I suggested that there could be two treatment options.  #1  REM sleep suppressant medication that would decrease apnea since it was REM dependent and off on the CPAP treatment as longer necessary.  Patient chose cymbalta .   #2 if the patient is loudly snoring and feels that his respiratory disturbance he is high CPAP should be attempted at a lower pressure. REM dependent apnea, patient would like to treat with REM supressent medication. In this case he started Cymbalta.  CO2 retention. Patient is on Oxygen at home, continue O2 supplemented at 2.5 litres. Dr Jacinto Halim prescriber . He continues cardio rehab, Interval history to-10-16, the patient reports that the Cymbalta has helped him to sleep deeper and sounder. Cymbalta is used as a REM sleep suppressant in his case as he had REM dependent sleep apnea. His wife added that he is also less panicky less  anxious. He noticed less anxiety. He still sleeps sounder and falls asleep quicker than he used to. Sleep latency is now less than an hour and maybe 30 minutes. He will have one nocturia.He does not have any headaches in the morning.    Review of Systems: Out of a complete 14 system review, the patient complains of only the following symptoms, and all other reviewed systems are negative. Sleepiness , fatigue, hearing loss,  Dysphonia of voice. Dysphagia - Gait  disorder- thought to be orthopedic.  forgetfulnes- now nocturia.  Epworth score 8 , Fatigue severity score 49  , depression score 5/ 15    Social History   Socioeconomic History  . Marital status: Married    Spouse name: [name]  . Number of children: 0  . Years of education:  Grad. scho  . Highest education level: Not on file  Social Needs  . Financial resource strain: Not on file  . Food insecurity - worry: Not on file  . Food insecurity - inability: Not on file  . Transportation needs - medical: Not on file  . Transportation needs - non-medical: Not on file  Occupational History  . Not on file  Tobacco Use  . Smoking status: Former Smoker    Last attempt to quit: 10/20/1983    Years since quitting: 33.3  . Smokeless tobacco: Never Used  Substance and Sexual Activity  . Alcohol use: Yes    Comment: occas.  . Drug use: No  . Sexual activity: Not on file  Other Topics Concern  . Not on file  Social History Narrative   Patient is married Medical illustrator) and lives at home with his wife.   Patient is retired.   Patient has a Naval architect.   Patient is right-handed.   Patient drinks 3-4 cups of coffee and three cups of soda/tea per day.    Past Medical History:  Diagnosis Date  . CAD (coronary artery disease) of artery bypass graft 12/09/2013  . Coronary artery disease   . Diabetes mellitus   . Hearing loss    wears bilateral hearing aids  . History of nephrolithiasis   . Hypertension   . Kidney stone     . MI (myocardial infarction) (HCC) 1985   (several)  . Stroke [facility])     Past Surgical History:  Procedure Laterality Date  . AV FISTULA REPAIR  2005  . broken wrist surgery Left 2001  . CAROTID ENDARTERECTOMY    . CORONARY ARTERY BYPASS GRAFT  1996   5 vessels  . KIDNEY STONE SURGERY    . WRIST SURGERY      Current Outpatient Medications  Medication Sig Dispense Refill  . acetaminophen (TYLENOL) 325 MG tablet Premedication with tylenol  Po before taking Aggrenox. 60 tablet 5  . ALPRAZolam (XANAX) 0.5 MG tablet Take 0.5 mg by mouth at bedtime as needed.      Marland Kitchen atorvastatin (LIPITOR) 10 MG tablet Take 10 mg by mouth daily.    . carvedilol (COREG) 25 MG tablet Take 25 mg by mouth 2 (two) times daily with a meal.    . dipyridamole-aspirin (AGGRENOX) 200-25 MG 12hr capsule Take 1 capsule by mouth 2 (two) times daily. 60 capsule 5  . DULoxetine (CYMBALTA) 20 MG capsule Take 60 mg by mouth daily.     . FENOFIBRATE PO Take 160 mg by mouth daily.     . ISOSORBIDE DINITRATE PO Take 90 mg by mouth daily.     Marland Kitchen LINZESS 145 MCG CAPS capsule Take 145 mcg daily by mouth.  12  . LOSARTAN POTASSIUM PO Take 25 mg by mouth daily.     . Magnesium 250 MG TABS Take 1 tablet by mouth 2 (two) times daily.    . Multiple Vitamins-Minerals (MULTIVITAMIN PO) Take 1 tablet by mouth daily.    . nitroGLYCERIN (NITROSTAT) 0.4 MG SL tablet Place 1 tablet (0.4 mg total) under the tongue every 5 (five) minutes as needed for chest pain. 25 tablet 2  . Omega-3 Fatty Acids (FISH OIL) 1000 MG CAPS Take 1 capsule by mouth 2 (two) times daily.    . ranolazine (RANEXA) 1000 MG SR tablet Take 1,000 mg by mouth 2 (two) times daily.     Marland Kitchen LINZESS 290 MCG CAPS capsule 1 CAP(S) 30 MINUTES BEFORE  BREAKFAST ORALLY 30 DAY(S)  11   No current facility-administered medications for this visit.     Allergies as of 02/24/2017 - Review Complete 02/24/2017  Allergen Reaction Noted  . Penicillins  03/14/2011    Vitals: BP (!)  102/58   Pulse 72   Ht 5\' 9"  (1.753 m)   Wt 225 lb (102.1 kg)   BMI 33.23 kg/m  Last Weight:  Wt Readings from Last 1 Encounters:  02/24/17 225 lb (102.1 kg)       Last Height:   Ht Readings from Last 1 Encounters:  02/24/17 5\' 9"  (1.753 m)    Physical exam:  General: The patient is awake, alert and appears not in acute distress.  Head: Normocephalic, atraumatic. Neck is supple. Mallampati 3 with a short airway, elongate uvula and tonsils, his uvula deviated to the left.   neck circumference: 17 ". Nasal airflow restricted , TMJ is not  evident . Retrognathia is not seen.  Cardiovascular:  Regular rate and rhyth, without  murmurs or carotid bruit, and without distended neck veins. Respiratory: Lungs are clear to auscultation. Skin:  Without evidence of edema, or rash Trunk: BMI is elevated and patient  has normal posture.  Neurologic exam : The patient is awake and alert, oriented to place and time.   Memory subjective  described as intact. There is a normal attention span & concentration ability. Speech is fluent with dysphonia . Mood and affect are appropriate. MMSE - Mini Mental State Exam 02/24/2017 10/24/2016  Orientation to time 4 4  Orientation to time comments - stated wrong date. 15th instead of 16th  Orientation to Place 5 5  Registration 3 3  Attention/ Calculation 5 5  Recall 3 3  Language- name 2 objects 2 2  Language- repeat 1 1  Language- follow 3 step command 2 3  Language- read & follow direction 1 1  Write a sentence 1 1  Copy design 1 1  Total score 28 29   27/30 - missed 2 out of 3 recall words. Will change to [facility] next visit.   Cranial nerves:Pupils are equal and briskly reactive to light. Visual fields by finger perimetry are intact. Hearing to finger rub intact.  Facial sensation intact to fine touch.  Facial motor strength : has a droop of the right face and equal tongue  movement. Equal shoulder shrug  Motor exam:  Elevated tone on the right arm,  pronator drift and dysmetria. - this was present in 2015  Gait and station: Patient walks with a walker- that was not the case when last seen.   Deep tendon reflexes: in the  upper and lower extremities are symmetric and intact. Babinski maneuver response is downgoing.  STUDY DATE: 11/14/2016 PATIENT NAME: CORLEONE BIEGLER DOB: [birth year] MRN: [id number]  EXAM: MRI Brain without contrast  ORDERING CLINICIAN: Porfirio Mylar Ayumi Wangerin [name] CLINICAL HISTORY: 71 year old man with possible TIA or NPH COMPARISON FILMS: none  TECHNIQUE: MRI of the brain without contrast was obtained utilizing 5 mm axial slices with T1, T2, T2 flair, SWI and diffusion weighted views.  T1 sagittal and T2 coronal views were obtained. CONTRAST: none IMAGING SITE: [facility], [address] [place], [place]  FINDINGS: On sagittal images, the spinal cord is imaged caudally to C3 and is normal in caliber.   The contents of the posterior fossa are of normal size and position.   The pituitary gland and optic chiasm appear normal.    The third and lateral ventricles are enlarged,  in proportion to the extent of moderate cortical atrophy.   There are a few small T2/FLAIR hyperintense foci in the right cerebellum consistent with chronic microvascular ischemic foci..   The deep gray matter appears normal.  In the hemispheres, there are multiple T2/FLAIR hyperintense foci in the deep and subcortical white matter. Chronic lacunar infarctions are noted adjacent to the left  basal ganglia and in the left centrum semiovale. There is a cortically based focus in the posterior right temporal lobe that is hyperintense on diffusion-weighted images implying a more recent occurrence.   Confluent chronic foci are noted in the periatrial white matter.  Susceptibility weighted images are normal.    The orbits appear normal.   The VIIth/VIIIth nerve complex appears normal.  The mastoid air cells appear normal.  Mucoperiosteal thickening is noted  within a couple of the ethmoid air cells. The other paranasal sinuses appear normal.  Flow voids are identified within the major intracerebral arteries.      IMPRESSION:  This MRI of the brain without contrast shows the following: 1.    Moderate cortical atrophy with compensatory ventricular enlargement. 2.    Acute peripheral ischemic focus in the posterior right temporal lobe that is hyperintense on diffusion-weighted images. 3.    Extensive extent of foci in the hemispheres and a couple in the cerebellum consistent with chronic microvascular ischemic changes.  There are confluent foci in the periatrial white matter. There are lacunar infarctions noted adjacent to the left basal ganglia and in the left centrum semiovale.     INTERPRETING PHYSICIAN:  Richard A. Epimenio FootSater, MD, PhD, FAAN Certified in  Neuroimaging by [employer]      Result Notes   Notes recorded by Melvyn Novasohmeier, Gemma Ruan, MD on 11/15/2016 at 5:37 PM EDT Not a TIA : Acute stroke in right temporal lobe.  Brain atrophy and extensive small vessel disease. I will refer to stroke team for further work up. CD  Primary care Dr. Rubye OaksPalmer is not on Epic, please share MRI report. CD.      Orders Requiring a Screening Form   Procedure Order Status Form Status  MR BRAIN WO CONTRAST Completed Completed  Vitals     Assessment:  After physical and neurologic examination, review of laboratory studies, imaging, neurophysiology testing and pre-existing records, assessment is   1)new stroke - right - accounts  for the gait instability, confusion/ memory loss and his urinary problems. His stroke could be off arousal embolic origin, he has an echocardiogram scheduled with Dr. Adelfa KohGunn G and 14 days, I would love to have an additional carotid ultrasound study, and to discuss change to Aggrenox from the current medication regimen of Plavix and aspirin.   2) MRI did not support NPH>  Newish stroke.  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 We used Cymbalta as a REM Supressant. He uses Oxygen at home for the last 2.5 years- he now wants to try CPAP - needs a new sleep study.  Ordered a SPLIT.  4) He likely had a previous stroke and not a TIA in January 2015. MRI confirmed this- he has lacunes throughout the left brain.  New stroke risk. risk based on  EF 40 % , CHF due to CAD , dyskinetic Heart. 5)   this is short term memory loss -  Early vascular dementia ?     Prolonged Re- Visit duration was 25 minutes.   Plan:  Treatment  plan and additional workup :   1) Stroke prevention- will ask Dr Jacinto HalimGanji to arrange for the doppler and Echo and  Cardiac monitor-I keep him on all current meds. 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   Porfirio Mylararmen Jamori Biggar MD  02/24/2017   addendum  02-27-2017  Yates DecampGanji, Jay, MD  Radley Barto, Porfirio Mylararmen, MD        Porfirio Mylararmen, here are the tests performed. You may change to Aggrenox if you want, I am fine. Only carotid not done recently but done last year. I can arrange this as well and pass it on to you.  JG   Lexiscan myoview stress test 12/30/2016:  1. Pharmacologic stress testing was performed with intravenous administration of .4 mg of Lexiscan over a 10-15 seconds infusion. Stress symptoms included dyspnea.  2. The resting electrocardiogram demonstrated normal sinus rhythm, IVCD and no resting arrhythmias. Old inferior infarct seen. Stress EKG is non diagnostic for ischemia as it is a pharmacologic stress.  3. SPECT images and gated SPECT imaging reveal old inferior infarct with no appreciable per infarct ischemia. The left ventricular ejection fraction was calculated at 39%  4. This is an intermediate risk study.   Echocardiogram 12/06/2016:  Left ventricle cavity is normal in size. Moderate concentric hypertrophy of the left ventricle. Inferior and inferolateral  moderate hypokinesis to akinesis. Doppler evidence of grade I (impaired) diastolic dysfunction. Visual LVEF 40-45%.   Left atrial cavity is moderately dilated at 4.5 cm.  Trace aortic regurgitation. Moderate calcification of the aortic valve annulus. Mild aortic valve leaflet calcification. Mildly restricted aortic valve leaflets. Mild aortic valve stenosis. Aortic valve peak pressure gradient of 30 and mean gradient of 17.5 mmHg, calculated aortic valve area 1.45 cm.  Mild (Grade I) mitral regurgitation.  Trace tricuspid regurgitation. Unable to estimate PA pressure due to absence/minimal TR signal. Compared to report from Echocardiogram 05/31/2012, no significant change. Grade 2 diastolic dysfunction.   Event Monitor 30 dayss 01/02/17: NSR. No Atrial. Fibrillation or heart block.   Carotid artery duplex 12/24/2015:  Minimal stenosis in the right internal carotid artery (minimal). Left carotid endarterectomy site patent.  Antegrade vertebral artery flow.

## 2017-02-27 ENCOUNTER — Telehealth: Payer: Self-pay | Admitting: Neurology

## 2017-02-27 ENCOUNTER — Encounter: Payer: Self-pay | Admitting: Neurology

## 2017-02-27 NOTE — Progress Notes (Signed)
Dear Vonna KotykJay,   I will keep Eduin on Plavix , not ASA. He is bruising easily.  I expect your carotid test results.   Sandra Cockaynearmen   Yang Rack, MD

## 2017-02-27 NOTE — Telephone Encounter (Signed)
Patient can stay on Plavix alone, without ASA.  Please tell him . CD

## 2017-02-28 NOTE — Telephone Encounter (Signed)
I tried calling patient but there was no answer and the vmbox was full.

## 2017-03-29 ENCOUNTER — Other Ambulatory Visit: Payer: Self-pay

## 2017-03-29 ENCOUNTER — Encounter (HOSPITAL_COMMUNITY): Payer: Self-pay | Admitting: *Deleted

## 2017-03-29 ENCOUNTER — Observation Stay (HOSPITAL_COMMUNITY)
Admission: EM | Admit: 2017-03-29 | Discharge: 2017-03-30 | Disposition: A | Discharge status: Home / Self Care | Payer: Medicare Other | Attending: Internal Medicine | Admitting: Internal Medicine

## 2017-03-29 ENCOUNTER — Telehealth: Payer: Self-pay | Admitting: Neurology

## 2017-03-29 ENCOUNTER — Emergency Department (HOSPITAL_COMMUNITY): Payer: Medicare Other

## 2017-03-29 ENCOUNTER — Observation Stay (HOSPITAL_COMMUNITY): Payer: Medicare Other

## 2017-03-29 DIAGNOSIS — I252 Old myocardial infarction: Secondary | ICD-10-CM | POA: Insufficient documentation

## 2017-03-29 DIAGNOSIS — R2990 Unspecified symptoms and signs involving the nervous system: Secondary | ICD-10-CM | POA: Diagnosis not present

## 2017-03-29 DIAGNOSIS — E119 Type 2 diabetes mellitus without complications: Secondary | ICD-10-CM | POA: Diagnosis not present

## 2017-03-29 DIAGNOSIS — E6609 Other obesity due to excess calories: Secondary | ICD-10-CM | POA: Insufficient documentation

## 2017-03-29 DIAGNOSIS — Z6834 Body mass index (BMI) 34.0-34.9, adult: Secondary | ICD-10-CM | POA: Insufficient documentation

## 2017-03-29 DIAGNOSIS — N179 Acute kidney failure, unspecified: Secondary | ICD-10-CM | POA: Diagnosis not present

## 2017-03-29 DIAGNOSIS — Z951 Presence of aortocoronary bypass graft: Secondary | ICD-10-CM | POA: Insufficient documentation

## 2017-03-29 DIAGNOSIS — R262 Difficulty in walking, not elsewhere classified: Secondary | ICD-10-CM | POA: Diagnosis not present

## 2017-03-29 DIAGNOSIS — Z79899 Other long term (current) drug therapy: Secondary | ICD-10-CM | POA: Insufficient documentation

## 2017-03-29 DIAGNOSIS — G459 Transient cerebral ischemic attack, unspecified: Secondary | ICD-10-CM | POA: Diagnosis not present

## 2017-03-29 DIAGNOSIS — E1122 Type 2 diabetes mellitus with diabetic chronic kidney disease: Secondary | ICD-10-CM

## 2017-03-29 DIAGNOSIS — N390 Urinary tract infection, site not specified: Secondary | ICD-10-CM | POA: Diagnosis not present

## 2017-03-29 DIAGNOSIS — Z8673 Personal history of transient ischemic attack (TIA), and cerebral infarction without residual deficits: Secondary | ICD-10-CM | POA: Insufficient documentation

## 2017-03-29 DIAGNOSIS — E1121 Type 2 diabetes mellitus with diabetic nephropathy: Secondary | ICD-10-CM

## 2017-03-29 DIAGNOSIS — Z7982 Long term (current) use of aspirin: Secondary | ICD-10-CM | POA: Diagnosis not present

## 2017-03-29 DIAGNOSIS — I2510 Atherosclerotic heart disease of native coronary artery without angina pectoris: Secondary | ICD-10-CM | POA: Diagnosis not present

## 2017-03-29 DIAGNOSIS — Z7902 Long term (current) use of antithrombotics/antiplatelets: Secondary | ICD-10-CM | POA: Insufficient documentation

## 2017-03-29 DIAGNOSIS — R4781 Slurred speech: Secondary | ICD-10-CM | POA: Diagnosis present

## 2017-03-29 DIAGNOSIS — I10 Essential (primary) hypertension: Secondary | ICD-10-CM | POA: Diagnosis not present

## 2017-03-29 DIAGNOSIS — N183 Chronic kidney disease, stage 3 (moderate): Secondary | ICD-10-CM

## 2017-03-29 LAB — APTT: aPTT: 32 seconds (ref 24–36)

## 2017-03-29 LAB — I-STAT TROPONIN, ED: Troponin i, poc: 0 ng/mL (ref 0.00–0.08)

## 2017-03-29 LAB — CBC
HCT: 36.1 % — ABNORMAL LOW (ref 39.0–52.0)
Hemoglobin: 12.4 g/dL — ABNORMAL LOW (ref 13.0–17.0)
MCH: 31.1 pg (ref 26.0–34.0)
MCHC: 34.3 g/dL (ref 30.0–36.0)
MCV: 90.5 fL (ref 78.0–100.0)
Platelets: 364 10*3/uL (ref 150–400)
RBC: 3.99 MIL/uL — ABNORMAL LOW (ref 4.22–5.81)
RDW: 14.5 % (ref 11.5–15.5)
WBC: 8.5 10*3/uL (ref 4.0–10.5)

## 2017-03-29 LAB — URINALYSIS, ROUTINE W REFLEX MICROSCOPIC
Bilirubin Urine: NEGATIVE
Glucose, UA: NEGATIVE mg/dL
Hgb urine dipstick: NEGATIVE
Ketones, ur: NEGATIVE mg/dL
Nitrite: POSITIVE — AB
Protein, ur: NEGATIVE mg/dL
Specific Gravity, Urine: 1.019 (ref 1.005–1.030)
Squamous Epithelial / LPF: NONE SEEN
pH: 6 (ref 5.0–8.0)

## 2017-03-29 LAB — DIFFERENTIAL
Basophils Absolute: 0 10*3/uL (ref 0.0–0.1)
Basophils Relative: 0 %
Eosinophils Absolute: 0.2 10*3/uL (ref 0.0–0.7)
Eosinophils Relative: 3 %
Lymphocytes Relative: 21 %
Lymphs Abs: 1.8 10*3/uL (ref 0.7–4.0)
Monocytes Absolute: 0.6 10*3/uL (ref 0.1–1.0)
Monocytes Relative: 7 %
Neutro Abs: 5.9 10*3/uL (ref 1.7–7.7)
Neutrophils Relative %: 69 %

## 2017-03-29 LAB — I-STAT CHEM 8, ED
BUN: 22 mg/dL — ABNORMAL HIGH (ref 6–20)
Calcium, Ion: 1.25 mmol/L (ref 1.15–1.40)
Chloride: 98 mmol/L — ABNORMAL LOW (ref 101–111)
Creatinine, Ser: 1.4 mg/dL — ABNORMAL HIGH (ref 0.61–1.24)
Glucose, Bld: 175 mg/dL — ABNORMAL HIGH (ref 65–99)
HCT: 37 % — ABNORMAL LOW (ref 39.0–52.0)
Hemoglobin: 12.6 g/dL — ABNORMAL LOW (ref 13.0–17.0)
Potassium: 4 mmol/L (ref 3.5–5.1)
Sodium: 139 mmol/L (ref 135–145)
TCO2: 29 mmol/L (ref 22–32)

## 2017-03-29 LAB — COMPREHENSIVE METABOLIC PANEL
ALT: 16 U/L — ABNORMAL LOW (ref 17–63)
AST: 24 U/L (ref 15–41)
Albumin: 3.2 g/dL — ABNORMAL LOW (ref 3.5–5.0)
Alkaline Phosphatase: 37 U/L — ABNORMAL LOW (ref 38–126)
Anion gap: 7 (ref 5–15)
BUN: 20 mg/dL (ref 6–20)
CO2: 27 mmol/L (ref 22–32)
Calcium: 9.2 mg/dL (ref 8.9–10.3)
Chloride: 97 mmol/L — ABNORMAL LOW (ref 101–111)
Creatinine, Ser: 1.42 mg/dL — ABNORMAL HIGH (ref 0.61–1.24)
GFR calc Af Amer: 55 mL/min — ABNORMAL LOW (ref >60)
GFR calc non Af Amer: 47 mL/min — ABNORMAL LOW (ref >60)
Glucose, Bld: 176 mg/dL — ABNORMAL HIGH (ref 65–99)
Potassium: 3.8 mmol/L (ref 3.5–5.1)
Sodium: 131 mmol/L — ABNORMAL LOW (ref 135–145)
Total Bilirubin: 0.7 mg/dL (ref 0.3–1.2)
Total Protein: 6.4 g/dL — ABNORMAL LOW (ref 6.5–8.1)

## 2017-03-29 LAB — CBG MONITORING, ED: Glucose-Capillary: 170 mg/dL — ABNORMAL HIGH (ref 65–99)

## 2017-03-29 LAB — PROTIME-INR
INR: 1.04
Prothrombin Time: 13.5 seconds (ref 11.4–15.2)

## 2017-03-29 MED ORDER — CARVEDILOL 12.5 MG PO TABS
25.0000 mg | ORAL_TABLET | Freq: Two times a day (BID) | ORAL | Status: DC
  Administered 2017-03-30: 25 mg via ORAL
  Filled 2017-03-29: qty 2

## 2017-03-29 MED ORDER — MAGNESIUM OXIDE 400 (241.3 MG) MG PO TABS
400.0000 mg | ORAL_TABLET | Freq: Every day | ORAL | Status: DC
  Administered 2017-03-30: 400 mg via ORAL
  Filled 2017-03-29: qty 1

## 2017-03-29 MED ORDER — NITROGLYCERIN 0.4 MG SL SUBL: 0.4000 mg | SUBLINGUAL_TABLET | SUBLINGUAL | Status: DC | PRN

## 2017-03-29 MED ORDER — ALPRAZOLAM 0.5 MG PO TABS: 0.5000 mg | ORAL_TABLET | Freq: Two times a day (BID) | ORAL | Status: DC | PRN

## 2017-03-29 MED ORDER — STROKE: EARLY STAGES OF RECOVERY BOOK
Freq: Once | Status: DC
  Filled 2017-03-29: qty 1

## 2017-03-29 MED ORDER — ATORVASTATIN CALCIUM 10 MG PO TABS
20.0000 mg | ORAL_TABLET | Freq: Every day | ORAL | Status: DC
  Administered 2017-03-30: 20 mg via ORAL
  Filled 2017-03-29: qty 1
  Filled 2017-03-29: qty 2

## 2017-03-29 MED ORDER — INSULIN ASPART 100 UNIT/ML ~~LOC~~ SOLN: 0.0000 [IU] | Freq: Every day | SUBCUTANEOUS | Status: DC

## 2017-03-29 MED ORDER — CLOPIDOGREL BISULFATE 75 MG PO TABS
75.0000 mg | ORAL_TABLET | Freq: Every morning | ORAL | Status: DC
  Administered 2017-03-30: 75 mg via ORAL
  Filled 2017-03-29: qty 1

## 2017-03-29 MED ORDER — SODIUM CHLORIDE 0.9 % IV SOLN
INTRAVENOUS | Status: DC
  Administered 2017-03-30: 01:00:00 via INTRAVENOUS

## 2017-03-29 MED ORDER — INSULIN ASPART 100 UNIT/ML ~~LOC~~ SOLN: 0.0000 [IU] | Freq: Three times a day (TID) | SUBCUTANEOUS | Status: DC

## 2017-03-29 MED ORDER — FENOFIBRATE 160 MG PO TABS
160.0000 mg | ORAL_TABLET | Freq: Every day | ORAL | Status: DC
  Administered 2017-03-30: 160 mg via ORAL
  Filled 2017-03-29: qty 1

## 2017-03-29 MED ORDER — ENOXAPARIN SODIUM 40 MG/0.4ML ~~LOC~~ SOLN
40.0000 mg | SUBCUTANEOUS | Status: DC
  Administered 2017-03-30: 40 mg via SUBCUTANEOUS
  Filled 2017-03-29: qty 0.4

## 2017-03-29 MED ORDER — RANOLAZINE ER 500 MG PO TB12
1000.0000 mg | ORAL_TABLET | Freq: Two times a day (BID) | ORAL | Status: DC
  Administered 2017-03-30: 1000 mg via ORAL
  Filled 2017-03-29 (×2): qty 2

## 2017-03-29 MED ORDER — ISOSORBIDE MONONITRATE ER 60 MG PO TB24
60.0000 mg | ORAL_TABLET | Freq: Two times a day (BID) | ORAL | Status: DC
  Administered 2017-03-30: 60 mg via ORAL
  Filled 2017-03-29: qty 1

## 2017-03-29 MED ORDER — SENNOSIDES-DOCUSATE SODIUM 8.6-50 MG PO TABS: 1.0000 | ORAL_TABLET | Freq: Every evening | ORAL | Status: DC | PRN

## 2017-03-29 MED ORDER — LOSARTAN POTASSIUM 50 MG PO TABS
25.0000 mg | ORAL_TABLET | Freq: Every day | ORAL | Status: DC
  Administered 2017-03-30: 25 mg via ORAL
  Filled 2017-03-29: qty 1

## 2017-03-29 MED ORDER — DULOXETINE HCL 60 MG PO CPEP
60.0000 mg | ORAL_CAPSULE | Freq: Every day | ORAL | Status: DC
  Administered 2017-03-30: 60 mg via ORAL
  Filled 2017-03-29 (×2): qty 1

## 2017-03-29 MED ORDER — HYDROCODONE-ACETAMINOPHEN 5-325 MG PO TABS: 1.0000 | ORAL_TABLET | Freq: Four times a day (QID) | ORAL | Status: DC | PRN

## 2017-03-29 MED ORDER — ACETAMINOPHEN 325 MG PO TABS: 650.0000 mg | ORAL_TABLET | ORAL | Status: DC | PRN

## 2017-03-29 MED ORDER — SODIUM CHLORIDE 0.9 % IV BOLUS (SEPSIS)
1000.0000 mL | Freq: Once | INTRAVENOUS | Status: AC
  Administered 2017-03-30: 1000 mL via INTRAVENOUS

## 2017-03-29 MED ORDER — GADOBENATE DIMEGLUMINE 529 MG/ML IV SOLN
20.0000 mL | Freq: Once | INTRAVENOUS | Status: AC | PRN
  Administered 2017-03-29: 20 mL via INTRAVENOUS

## 2017-03-29 MED ORDER — ACETAMINOPHEN 650 MG RE SUPP: 650.0000 mg | RECTAL | Status: DC | PRN

## 2017-03-29 MED ORDER — ALPRAZOLAM 0.5 MG PO TABS
0.5000 mg | ORAL_TABLET | Freq: Every day | ORAL | Status: DC
  Administered 2017-03-30: 0.5 mg via ORAL
  Filled 2017-03-29: qty 1

## 2017-03-29 NOTE — ED Triage Notes (Signed)
[facility] and [place] and his wife said he was unsteady on his feet but he thinks it is his knees.  She reported he had slurred speech, but none now.  He has a right sided facial droop from previous stroke.  He follows commands and no other deficits noted.

## 2017-03-29 NOTE — ED Notes (Signed)
ED Provider at bedside. 

## 2017-03-29 NOTE — Telephone Encounter (Signed)
Pt wife(no DPR on file) has called and states pt has had a silent stroke and she wants to know what to do.  Wife was asked if she was in route to ED and she said no because it was a silent stroke.  Mrs [name] was informed that with there not being a DPR on file it is not likely that the RN or Dr Vickey Hugerohmeier would be able to call her.  Pt wife then began to cry stating she just wants to know what to do.  Pt wife was informed that the message would be sent but no gurantees of the RN being able to speak with her .  Pt wife was advised to take pt to ED

## 2017-03-29 NOTE — ED Notes (Signed)
Pt transported to MRI 

## 2017-03-29 NOTE — ED Provider Notes (Signed)
[facility] EM[facility]ENC[facility] Provider Note   CSN: [id number] Arrival date & time: 03/29/17  1344     History   Chief Complaint Chief Complaint  Patient presents with  . Stroke Symptoms    HPI Sharma CovertRoyce C Glassburn is a 71 y.o. male.  HPI 71 year old male who presents with slurred speech and gait instability.  He has a previous history of coronary artery disease, diabetes, hypertension, and prior stroke. History provided by both the patient and his wife.  His wife reports that from Sunday to Monday, she did not speak much to her husband because his hearing aid was broken.  On Tuesday when they fixed his hearing aid and he began to speak she began to notice slurring of his words.  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  He has not had any falls, vision changes, word finding difficulties, focal numbness or weakness, dizziness, chest pain or difficulty breathing, fevers or recent illnesses.  When he woke up this morning, he was reportedly back to his baseline. Does endorse urinary frequency.    Past Medical History:  Diagnosis Date  . CAD (coronary artery disease) of artery bypass graft 12/09/2013  . Coronary artery disease   . Diabetes mellitus   . Hearing loss    wears bilateral hearing aids  . History of nephrolithiasis   . Hypertension   . Kidney stone   . MI (myocardial infarction) (HCC) 1985   (several)  . Stroke [facility](HCC)     Patient Active Problem List   Diagnosis Date Noted  . 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 Hx of transient ischemic attack (TIA) 10/24/2016  . Coronary artery disease involving coronary bypass graft with unspecified angina pectoris 05/21/2014  . Sleep related hypoventilation/hypoxemia in other disease 01/15/2014  . Primary snoring 12/09/2013  . Hypersomnia with sleep apnea, unspecified  12/09/2013  . Hypoxemia 12/09/2013  . CAD (coronary artery disease) of artery bypass graft 12/09/2013    Past Surgical History:  Procedure Laterality Date  . AV FISTULA REPAIR  2005  . broken wrist surgery Left 2001  . CAROTID ENDARTERECTOMY    . CORONARY ARTERY BYPASS GRAFT  1996   5 vessels  . KIDNEY STONE SURGERY    . WRIST SURGERY         Home Medications    Prior to Admission medications   Medication Sig Start Date End Date Taking? Authorizing Provider  ALPRAZolam Prudy Feeler(XANAX) 0.5 MG tablet Take 1 tablet (0.5 mg total) at bedtime as needed by mouth. Patient taking differently: Take 0.5 mg by mouth See admin instructions. 0.5 mg two times a day as needed for anxiety and 0.5 mg at bedtime SCHEDULED 02/24/17  Yes Dohmeier, Porfirio Mylararmen, MD  aspirin EC 81 MG tablet Take 81 mg by mouth at bedtime.   Yes [provider]  atorvastatin (LIPITOR) 20 MG tablet Take 20 mg by mouth at bedtime.   Yes [provider]  carvedilol (COREG) 25 MG tablet Take 25 mg by mouth 2 (two) times daily with a meal.   Yes [provider]  clopidogrel (PLAVIX) 75 MG tablet Take 75 mg by mouth every morning.    Yes [provider]  DULoxetine (CYMBALTA) 60 MG capsule Take 60 mg by mouth at bedtime.   Yes [provider]  fenofibrate 160 MG tablet Take 160  mg by mouth daily.   Yes [provider]  HYDROcodone-acetaminophen (NORCO/VICODIN) 5-325 MG tablet Take 1 tablet by mouth every 4 (four) hours as needed for pain. 03/14/17  Yes [provider]  isosorbide mononitrate (IMDUR) 30 MG 24 hr tablet Take 60 mg by mouth 2 (two) times daily.   Yes [provider]  LINZESS 145 MCG CAPS capsule Take 145 mcg daily by mouth. 01/09/17  Yes [provider]  losartan (COZAAR) 25 MG tablet Take 25 mg by mouth daily.   Yes [provider]  magnesium oxide (MAG-OX) 400 MG tablet Take 400 mg by mouth daily.   Yes [provider]  Multiple  Vitamins-Minerals (MULTIVITAMIN PO) Take 1 tablet by mouth daily.   Yes [provider]  nitroGLYCERIN (NITROSTAT) 0.4 MG SL tablet Place 1 tablet (0.4 mg total) under the tongue every 5 (five) minutes as needed for chest pain. 06/26/13  Yes Runell GessBerry, Jonathan J, MD  ranolazine (RANEXA) 1000 MG SR tablet Take 1,000 mg by mouth 2 (two) times daily.    Yes [provider]  acetaminophen (TYLENOL) 325 MG tablet Premedication with tylenol  Po before taking Aggrenox. Patient not taking: Reported on 03/29/2017 11/21/16   Dohmeier, Porfirio Mylararmen, MD  FENOFIBRATE PO Take 160 mg by mouth daily.     [provider]  ISOSORBIDE DINITRATE PO Take 90 mg by mouth daily.     [provider]    Family History Family History  Problem Relation Age of Onset  . Heart disease Mother   . Liver cancer Unknown   . Parkinson's disease Unknown   . Heart disease Brother   . Parkinsonism Sister   . Cancer Sister     Social History Social History   Tobacco Use  . Smoking status: Former Smoker    Last attempt to quit: 10/20/1983    Years since quitting: 33.4  . Smokeless tobacco: Never Used  Substance Use Topics  . Alcohol use: Yes    Comment: occas.  . Drug use: No     Allergies   Penicillins   Review of Systems Review of Systems  Constitutional: Negative for fever.  Respiratory: Negative for shortness of breath.   Cardiovascular: Negative for chest pain.  Neurological: Negative for tremors, weakness and numbness.  Hematological: Does not bruise/bleed easily.  All other systems reviewed and are negative.    Physical Exam Updated Vital Signs BP (!) 136/53   Pulse 61   Temp (!) 97.4 F (36.3 C) (Oral)   Resp 19   SpO2 98%   Physical Exam Physical Exam  Nursing note and vitals reviewed. Constitutional: Well developed, well nourished, non-toxic, and in no acute distress Head: Normocephalic and atraumatic.  Mouth/Throat: Oropharynx is clear and moist.  Neck:  Normal range of motion. Neck supple.  Cardiovascular: Normal rate and regular rhythm.   Pulmonary/Chest: Effort normal and breath sounds normal.  Abdominal: Soft. There is no tenderness. There is no rebound and no guarding.  Musculoskeletal: Normal range of motion.  Skin: Skin is warm and dry.  Psychiatric: Cooperative Neurological:  Alert, oriented to person, place, time, and situation. Memory grossly in tact. Fluent speech. No dysarthria or aphasia.  Cranial nerves: VF are full. EOMI without nystagmus. No gaze deviation. Facial muscles with baseline right facial droop. Sensation to light touch over face in tact bilaterally. Hearing grossly in tact. Palate elevates symmetrically. Head turn and shoulder shrug are intact. Tongue midline.  Reflexes defered.  Muscle bulk and tone  normal. No pronator drift. Moves all extremities symmetrically. Sensation to light touch is in tact throughout in bilateral upper and lower extremities. Coordination reveals no dysmetria with finger to nose. Gait is narrow-based and steady. Non-ataxic.    ED Treatments / Results  Labs (all labs ordered are listed, but only abnormal results are displayed) Labs Reviewed  CBC - Abnormal; Notable for the following components:      Result Value   RBC 3.99 (*)    Hemoglobin 12.4 (*)    HCT 36.1 (*)    All other components within normal limits  COMPREHENSIVE METABOLIC PANEL - Abnormal; Notable for the following components:   Sodium 131 (*)    Chloride 97 (*)    Glucose, Bld 176 (*)    Creatinine, Ser 1.42 (*)    Total Protein 6.4 (*)    Albumin 3.2 (*)    ALT 16 (*)    Alkaline Phosphatase 37 (*)    GFR calc non Af Amer 47 (*)    GFR calc Af Amer 55 (*)    All other components within normal limits  CBG MONITORING, ED - Abnormal; Notable for the following components:   Glucose-Capillary 170 (*)    All other components within normal limits  I-STAT CHEM 8, ED - Abnormal; Notable for the following components:    Chloride 98 (*)    BUN 22 (*)    Creatinine, Ser 1.40 (*)    Glucose, Bld 175 (*)    Hemoglobin 12.6 (*)    HCT 37.0 (*)    All other components within normal limits  PROTIME-INR  APTT  DIFFERENTIAL  URINALYSIS, ROUTINE W REFLEX MICROSCOPIC  I-STAT TROPONIN, ED    EKG  EKG Interpretation  Date/Time:  Wednesday March 29 2017 13:36:38 EST Ventricular Rate:  66 PR Interval:  170 QRS Duration: 126 QT Interval:  440 QTC Calculation: 461 R Axis:   2 Text Interpretation:  Normal sinus rhythm Non-specific intra-ventricular conduction block Nonspecific T wave abnormality Abnormal ECG no acute changes  Confirmed by Crista Curb [phone]) on 03/29/2017 6:10:51 PM       Radiology Ct Head Wo Contrast  Result Date: 03/29/2017 CLINICAL DATA:  Unsteady gait, slurred speech EXAM: CT HEAD WITHOUT CONTRAST TECHNIQUE: Contiguous axial images were obtained from the base of the skull through the vertex without intravenous contrast. COMPARISON:  MRI 12/06/2016 FINDINGS: Brain: No acute territorial infarction, hemorrhage, or intracranial mass is visualized. Moderate subcortical and periventricular white matter hypodensity consistent with small vessel ischemic change. Old left lacunar ganglial capsular infarcts. Mild to moderate atrophy. Stable ventricle size. Vascular: No hyperdense vessels.  Carotid artery calcification. Skull: No fracture or suspicious lesion Sinuses/Orbits: Mild mucosal thickening in the sphenoid and ethmoid sinuses. No acute orbital abnormality. Other: None IMPRESSION: 1. No definite CT evidence for acute intracranial abnormality. 2. Atrophy and small vessel ischemic changes of the white matter. Electronically Signed   By: Jasmine Pang [name]   On: 03/29/2017 15:01    Procedures Procedures (including critical care time)  Medications Ordered in ED Medications  sodium chloride 0.9 % bolus 1,000 mL (not administered)     Initial Impression / Assessment and Plan / ED Course  I  have reviewed the triage vital signs and the nursing notes.  Pertinent labs & imaging results that were available during my care of the patient were reviewed by me and considered in my medical decision making (see chart for details).     Patient with reported slurred speech  and gait instability one day prior that is now resolved.  Does have residual facial droop on the right side, which she reports is baseline.  The remainder of his neurological exam seems intact.  Records are reviewed.  He did have CVA/TIA workup as an outpatient through Dr. Vickey Huger from neurology.  With MRI MRA in August.  Also with follow-up with cardiology for cardiac monitor and echo with Dr. Jacinto Halim subsequently afterwards.  Currently on Plavix and aspirin.  His CT head today shows no acute changes.  Blood work notable for mild acute kidney injury.  No other major electrolyte or metabolic derangements.  UA is pending.  No sedating medications recently that could cause this.   I discussed this with Dr. Benard Rink from neurology.  Given his multiple stroke risk factors, he did recommend admitting patient as observation for full TIA workup.  MRI/MRA have been ordered.  Discussed with Dr. Katrinka Blazing from hospitalist service who will admit for ongoing workup.  Final Clinical Impressions(s) / ED Diagnoses   Final diagnoses:  TIA (transient ischemic attack)    ED Discharge Orders    None       Lavera Guise, MD 03/29/17 2130

## 2017-03-29 NOTE — Telephone Encounter (Signed)
Patients wife states when he woke up on Monday, pt was slurring speech, and gait was off. She states that she was unsure of this change due to the patient having complications from his hearing aids. Now that has been adjusted she is definitely seeing that there is worsening with speech, gait and memory worsening. I have encouraged the patient that she needs to take the patient to ED to get evaluated. The patients wife became tearful and thinks this will cause a disruption to take him to the ER especially with the memory difficulties he is having. She states that she has assessed the smile and that is symmetrical and she also states there is no drift noted in arms or legs. She would rather me check with Dr Vickey Hugerohmeier and make sure she feels this is necessary since this is 3 days later from onset. Dr Dohmeier was available during the phone and so I informed her of this and she is also encouraging the pt be evaluated by the ED to have a repeat CT scan completed. I informed the wife that the patient is on Asprin and Plavix we need to make sure his CT doesn't show a hemorrhagic stroke. pts wife was upset about this but agreed she would have the patient evaluated and was encouraged [facility]. I encouraged that the patient be taken in by EMS.

## 2017-03-29 NOTE — H&P (Signed)
History and Physical    [name] ZOX:[id number] DOB: 09/12/1945 DOA: 03/29/2017  Referring MD/NP/PA: Dr. Elissa Heftyiana Liu PCP: [name], [name], MD  Patient coming from: home   Chief Complaint: Stroke- like symptoms  I have personally briefly reviewed patient's [facility]   HPI: [name] is a 71 y.o. male with medical history significant of CVA w/ residual right-sided facial droop, CAD s/p CABG, HTN, hearing loss, nephrolithiasis, and dementia; who presented with complaints of slurred speech, confusion, and unsteady gait.  History is mostly obtained from the patient's wife as he is significantly hard of hearing and is still confused.  The patient's hearing aid had been broken 12/16-12/17, and were fixed at the [facility] on 12/18.  The patient's wife notes that she had not been speaking to him much as he is very hard of hearing, but after the hearing aids were fixed noted that his speech was more slurred than usual and reported that his gait was more unsteady than usual.  She initially related symptoms to issues with his arthritis, but also noted that he was more confused than baseline.  Patient also had reportedly had increased urinary frequency.  She notes that he has a kidney stone, but does not normally get urinary tract infection.  She had discussed the symptoms with his neurologist recommended him come into the hospital for further evaluation.    ED Course:Upon admission into the emergency department patient was not noted to have any acute abnormalities on CT scan of the brain.  He had noted complete resolution of symptoms.  Neurology was consulted and recommended obtaining MRI and possibly further testing.  Labs revealed BUN 22, creatinine 1.4, glucose 175.  TRH called to admit.  A urinalysis was pending.  Review of Systems  Constitutional: Negative for chills and fever.  HENT: Positive for hearing loss. Negative for nosebleeds.   Eyes: Negative for  blurred vision and double vision.  Respiratory: Negative for cough and sputum production.   Cardiovascular: Negative for chest pain and leg swelling.  Gastrointestinal: Negative for abdominal pain, nausea and vomiting.  Genitourinary: Positive for frequency. Negative for dysuria.  Musculoskeletal: Positive for joint pain. Negative for falls.  Skin: Negative for itching and rash.  Neurological: Positive for speech change and weakness. Negative for focal weakness and seizures.  Psychiatric/Behavioral: Positive for memory loss. Negative for substance abuse.       Positive for confusion    Past Medical History:  Diagnosis Date  . CAD (coronary artery disease) of artery bypass graft 12/09/2013  . Coronary artery disease   . Diabetes mellitus   . Hearing loss    wears bilateral hearing aids  . History of nephrolithiasis   . Hypertension   . Kidney stone   . MI (myocardial infarction) (HCC) 1985   (several)  . Stroke [facility](HCC)     Past Surgical History:  Procedure Laterality Date  . AV FISTULA REPAIR  2005  . broken wrist surgery Left 2001  . CAROTID ENDARTERECTOMY    . CORONARY ARTERY BYPASS GRAFT  1996   5 vessels  . KIDNEY STONE SURGERY    . WRIST SURGERY       reports that he quit smoking about 33 years ago. he has never used smokeless tobacco. He reports that he drinks alcohol. He reports that he does not use drugs.  Allergies  Allergen Reactions  . Penicillins Other (See Comments)    From childhood: Has patient had a  PCN reaction causing immediate rash, facial/tongue/throat swelling, SOB or lightheadedness with hypotension: Unk Has patient had a PCN reaction causing severe rash involving mucus membranes or skin necrosis: Unk Has patient had a PCN reaction that required hospitalization: Unk Has patient had a PCN reaction occurring within the last 10 years: No If all of the above answers are "NO", then may proceed with Cephalosporin use.     Family History  Problem  Relation Age of Onset  . Heart disease Mother   . Liver cancer Unknown   . Parkinson's disease Unknown   . Heart disease Brother   . Parkinsonism Sister   . Cancer Sister     Prior to Admission medications   Medication Sig Start Date End Date Taking? Authorizing Provider  ALPRAZolam Prudy Feeler(XANAX) 0.5 MG tablet Take 1 tablet (0.5 mg total) at bedtime as needed by mouth. Patient taking differently: Take 0.5 mg by mouth See admin instructions. 0.5 mg two times a day as needed for anxiety and 0.5 mg at bedtime SCHEDULED 02/24/17  Yes Dohmeier, Porfirio Mylararmen, MD  aspirin EC 81 MG tablet Take 81 mg by mouth at bedtime.   Yes [provider]  atorvastatin (LIPITOR) 20 MG tablet Take 20 mg by mouth at bedtime.   Yes [provider]  carvedilol (COREG) 25 MG tablet Take 25 mg by mouth 2 (two) times daily with a meal.   Yes [provider]  clopidogrel (PLAVIX) 75 MG tablet Take 75 mg by mouth every morning.    Yes [provider]  DULoxetine (CYMBALTA) 60 MG capsule Take 60 mg by mouth at bedtime.   Yes [provider]  fenofibrate 160 MG tablet Take 160 mg by mouth daily.   Yes [provider]  HYDROcodone-acetaminophen (NORCO/VICODIN) 5-325 MG tablet Take 1 tablet by mouth every 4 (four) hours as needed for pain. 03/14/17  Yes [provider]  isosorbide mononitrate (IMDUR) 30 MG 24 hr tablet Take 60 mg by mouth 2 (two) times daily.   Yes [provider]  LINZESS 145 MCG CAPS capsule Take 145 mcg daily by mouth. 01/09/17  Yes [provider]  losartan (COZAAR) 25 MG tablet Take 25 mg by mouth daily.   Yes [provider]  magnesium oxide (MAG-OX) 400 MG tablet Take 400 mg by mouth daily.   Yes [provider]  Multiple Vitamins-Minerals (MULTIVITAMIN PO) Take 1 tablet by mouth daily.   Yes [provider]  nitroGLYCERIN (NITROSTAT) 0.4 MG SL tablet Place 1 tablet (0.4 mg total) under the tongue every 5  (five) minutes as needed for chest pain. 06/26/13  Yes Runell GessBerry, Jonathan J, MD  ranolazine (RANEXA) 1000 MG SR tablet Take 1,000 mg by mouth 2 (two) times daily.    Yes [provider]  acetaminophen (TYLENOL) 325 MG tablet Premedication with tylenol  Po before taking Aggrenox. Patient not taking: Reported on 03/29/2017 11/21/16   Dohmeier, Porfirio Mylararmen, MD  FENOFIBRATE PO Take 160 mg by mouth daily.     [provider]  ISOSORBIDE DINITRATE PO Take 90 mg by mouth daily.     [provider]    Physical Exam:  Constitutional: Elderly male who appears to be in no acute distress, but asked similar questions repeatedly. Vitals:   03/29/17 1915 03/29/17 1927 03/29/17 2000 03/29/17 2045  BP: 120/72 134/66 125/64 (!) 136/53  Pulse: 60 60 61 61  Resp: 17 18 (!) 25 19  Temp:      TempSrc:  SpO2: 97% 100% 98% 98%   Eyes: PERRL, lids and conjunctivae normal ENMT: Very hard of hearing, dry mucous membranes. Neck: normal, supple, no masses, no thyromegaly Respiratory: clear to auscultation bilaterally, no wheezing, no crackles. Normal respiratory effort. No accessory muscle use.  Cardiovascular: Regular rate and rhythm, no murmurs / rubs / gallops. No extremity edema. 2+ pedal pulses. No carotid bruits.  Abdomen: no tenderness, no masses palpated. No hepatosplenomegaly. Bowel sounds positive.  Musculoskeletal: no clubbing / cyanosis. No joint deformity upper and lower extremities. Good ROM, no contractures. Normal muscle tone.  Skin: no rashes, lesions, ulcers. No induration Neurologic: CN 2-12 grossly intact.  Right-sided facial droop present. Psychiatric: Alert and oriented x person and place.  Normal mood    Labs on Admission: I have personally reviewed following labs and imaging studies  CBC: Recent Labs  Lab 03/29/17 1353 03/29/17 1412  WBC 8.5  --   NEUTROABS 5.9  --   HGB 12.4* 12.6*  HCT 36.1* 37.0*  MCV 90.5  --   PLT 364  --    Basic Metabolic  Panel: Recent Labs  Lab 03/29/17 1353 03/29/17 1412  NA 131* 139  K 3.8 4.0  CL 97* 98*  CO2 27  --   GLUCOSE 176* 175*  BUN 20 22*  CREATININE 1.42* 1.40*  CALCIUM 9.2  --    GFR: CrCl cannot be calculated (Unknown ideal weight.). Liver Function Tests: Recent Labs  Lab 03/29/17 1353  AST 24  ALT 16*  ALKPHOS 37*  BILITOT 0.7  PROT 6.4*  ALBUMIN 3.2*   No results for input(s): LIPASE, AMYLASE in the last 168 hours. No results for input(s): AMMONIA in the last 168 hours. Coagulation Profile: Recent Labs  Lab 03/29/17 1353  INR 1.04   Cardiac Enzymes: No results for input(s): CKTOTAL, CKMB, CKMBINDEX, TROPONINI in the last 168 hours. BNP (last 3 results) No results for input(s): PROBNP in the last 8760 hours. HbA1C: No results for input(s): HGBA1C in the last 72 hours. CBG: Recent Labs  Lab 03/29/17 1402  GLUCAP 170*   Lipid Profile: No results for input(s): CHOL, HDL, LDLCALC, TRIG, CHOLHDL, LDLDIRECT in the last 72 hours. Thyroid Function Tests: No results for input(s): TSH, T4TOTAL, FREET4, T3FREE, THYROIDAB in the last 72 hours. Anemia Panel: No results for input(s): VITAMINB12, FOLATE, FERRITIN, TIBC, IRON, RETICCTPCT in the last 72 hours. Urine analysis: No results found for: COLORURINE, APPEARANCEUR, LABSPEC, PHURINE, GLUCOSEU, HGBUR, BILIRUBINUR, KETONESUR, PROTEINUR, UROBILINOGEN, NITRITE, LEUKOCYTESUR Sepsis Labs: No results found for this or any previous visit (from the past 240 hour(s)).   Radiological Exams on Admission: Ct Head Wo Contrast  Result Date: 03/29/2017 CLINICAL DATA:  Unsteady gait, slurred speech EXAM: CT HEAD WITHOUT CONTRAST TECHNIQUE: Contiguous axial images were obtained from the base of the skull through the vertex without intravenous contrast. COMPARISON:  MRI 12/06/2016 FINDINGS: Brain: No acute territorial infarction, hemorrhage, or intracranial mass is visualized. Moderate subcortical and periventricular white matter  hypodensity consistent with small vessel ischemic change. Old left lacunar ganglial capsular infarcts. Mild to moderate atrophy. Stable ventricle size. Vascular: No hyperdense vessels.  Carotid artery calcification. Skull: No fracture or suspicious lesion Sinuses/Orbits: Mild mucosal thickening in the sphenoid and ethmoid sinuses. No acute orbital abnormality. Other: None IMPRESSION: 1. No definite CT evidence for acute intracranial abnormality. 2. Atrophy and small vessel ischemic changes of the white matter. Electronically Signed   By: Jasmine Pang [name]   On: 03/29/2017 15:01    EKG: Independently  reviewed.  Sinus rhythm with nonspecific T wave abnormality  Assessment/Plan Stroke-like symptoms: Patient reported having some slurred speech and difficulty balancing over the few days, but symptoms are now resolved.  Previous workup back in August was negative.  Question possibility of acute stroke versus infection versus other. - Admit to a telemetry bed - Neurochecks - Check lipid panel and - Follow-up MRI/MRA of the head and neck - PT/ speech to eval and treat - Appreciate neurology consultative services, will follow-up for further recommendations  Urinary tract infection: Acute.  UA positive for nitrites, moderate leukocytes, rare bacteria, and too numerous to count WBCs.  Suspect urinary tract infection could be causing patient's acute symptoms. - Follow-up urine culture - Rocephin IV  Acute kidney injury: Patient baseline creatinine previously had been within normal limits back in 10/2016.  Blood patient presents with a creatinine of 1.42 and BUN 20.  Suspect secondary to poor oral intake. - Check renal ultrasound - IV fluids of normal saline at 100 mL/h overnight tolerated - Recheck BMP in a.m.  CAD s/p CABG - Continue Plavix, Ranexa, isosorbide mononitrate  Diabetes mellitus type 2: No previous hemoglobin A1c to compare.  Patient currently not on any medications. - Hypoglycemic  protocol - CBGs q. before meals and at bedtime with sensitive SSI - adjust regimen as needed  Essential hypertension - Continue Coreg, losartan, and other medications as seen above  Dyslipidemia - Continue fenofibrate and atorvastatin   DVT prophylaxis: lovenox Code Status: full  Family Communication: Plan of care with the patient and family present at bedside Disposition Plan: Likely discharge home in 1-2 days if workup negative Consults called: neuro  Admission status: observation  Clydie Braun MD [facility] Pager [phone]   If 7PM-7AM, please contact night-coverage [email or web address] Password [email or web address]  03/29/2017, 9:51 PM

## 2017-03-29 NOTE — ED Notes (Signed)
Attempted to call report at this time 

## 2017-03-30 ENCOUNTER — Encounter (HOSPITAL_COMMUNITY): Payer: Self-pay | Admitting: *Deleted

## 2017-03-30 DIAGNOSIS — N390 Urinary tract infection, site not specified: Secondary | ICD-10-CM | POA: Diagnosis not present

## 2017-03-30 DIAGNOSIS — R4781 Slurred speech: Secondary | ICD-10-CM | POA: Diagnosis not present

## 2017-03-30 DIAGNOSIS — I10 Essential (primary) hypertension: Secondary | ICD-10-CM

## 2017-03-30 DIAGNOSIS — R2990 Unspecified symptoms and signs involving the nervous system: Secondary | ICD-10-CM | POA: Diagnosis present

## 2017-03-30 DIAGNOSIS — N179 Acute kidney failure, unspecified: Secondary | ICD-10-CM | POA: Diagnosis present

## 2017-03-30 DIAGNOSIS — G9341 Metabolic encephalopathy: Secondary | ICD-10-CM

## 2017-03-30 DIAGNOSIS — N058 Unspecified nephritic syndrome with other morphologic changes: Secondary | ICD-10-CM | POA: Diagnosis not present

## 2017-03-30 DIAGNOSIS — E1121 Type 2 diabetes mellitus with diabetic nephropathy: Secondary | ICD-10-CM

## 2017-03-30 DIAGNOSIS — E1122 Type 2 diabetes mellitus with diabetic chronic kidney disease: Secondary | ICD-10-CM

## 2017-03-30 DIAGNOSIS — I2510 Atherosclerotic heart disease of native coronary artery without angina pectoris: Secondary | ICD-10-CM | POA: Diagnosis not present

## 2017-03-30 DIAGNOSIS — E1129 Type 2 diabetes mellitus with other diabetic kidney complication: Secondary | ICD-10-CM | POA: Diagnosis not present

## 2017-03-30 DIAGNOSIS — G459 Transient cerebral ischemic attack, unspecified: Secondary | ICD-10-CM

## 2017-03-30 DIAGNOSIS — N183 Chronic kidney disease, stage 3 unspecified: Secondary | ICD-10-CM

## 2017-03-30 LAB — HEMOGLOBIN A1C
Hgb A1c MFr Bld: 5.8 % — ABNORMAL HIGH (ref 4.8–5.6)
Mean Plasma Glucose: 119.76 mg/dL

## 2017-03-30 LAB — GLUCOSE, CAPILLARY
Glucose-Capillary: 107 mg/dL — ABNORMAL HIGH (ref 65–99)
Glucose-Capillary: 101 mg/dL — ABNORMAL HIGH (ref 65–99)
Glucose-Capillary: 131 mg/dL — ABNORMAL HIGH (ref 65–99)

## 2017-03-30 LAB — LIPID PANEL
Cholesterol: 150 mg/dL (ref 0–200)
HDL: 34 mg/dL — ABNORMAL LOW (ref >40)
LDL Cholesterol: 74 mg/dL (ref 0–99)
Total CHOL/HDL Ratio: 4.4 RATIO
Triglycerides: 208 mg/dL — ABNORMAL HIGH (ref <150)
VLDL: 42 mg/dL — ABNORMAL HIGH (ref 0–40)

## 2017-03-30 LAB — BASIC METABOLIC PANEL
Anion gap: 5 (ref 5–15)
BUN: 19 mg/dL (ref 6–20)
CO2: 27 mmol/L (ref 22–32)
Calcium: 8.7 mg/dL — ABNORMAL LOW (ref 8.9–10.3)
Chloride: 105 mmol/L (ref 101–111)
Creatinine, Ser: 1.29 mg/dL — ABNORMAL HIGH (ref 0.61–1.24)
GFR calc Af Amer: >60 mL/min (ref >60)
GFR calc non Af Amer: 53 mL/min — ABNORMAL LOW (ref >60)
Glucose, Bld: 106 mg/dL — ABNORMAL HIGH (ref 65–99)
Potassium: 3.9 mmol/L (ref 3.5–5.1)
Sodium: 137 mmol/L (ref 135–145)

## 2017-03-30 LAB — CBC
HCT: 32.9 % — ABNORMAL LOW (ref 39.0–52.0)
Hemoglobin: 11.4 g/dL — ABNORMAL LOW (ref 13.0–17.0)
MCH: 31.8 pg (ref 26.0–34.0)
MCHC: 34.7 g/dL (ref 30.0–36.0)
MCV: 91.6 fL (ref 78.0–100.0)
Platelets: 335 10*3/uL (ref 150–400)
RBC: 3.59 MIL/uL — ABNORMAL LOW (ref 4.22–5.81)
RDW: 14.9 % (ref 11.5–15.5)
WBC: 7.3 10*3/uL (ref 4.0–10.5)

## 2017-03-30 MED ORDER — CEFUROXIME AXETIL 500 MG PO TABS: 500.0000 mg | ORAL_TABLET | Freq: Two times a day (BID) | ORAL | 0 refills | Status: AC

## 2017-03-30 MED ORDER — HYDROCODONE-ACETAMINOPHEN 5-325 MG PO TABS: 1.0000 | ORAL_TABLET | Freq: Four times a day (QID) | ORAL | Status: DC | PRN

## 2017-03-30 MED ORDER — TAMSULOSIN HCL 0.4 MG PO CAPS: 0.4000 mg | ORAL_CAPSULE | Freq: Every day | ORAL | 0 refills | Status: DC

## 2017-03-30 MED ORDER — DEXTROSE 5 % IV SOLN: 1.0000 g | INTRAVENOUS | Status: DC

## 2017-03-30 MED ORDER — DEXTROSE 5 % IV SOLN
2.0000 g | Freq: Once | INTRAVENOUS | Status: AC
  Administered 2017-03-30: 2 g via INTRAVENOUS
  Filled 2017-03-30: qty 2

## 2017-03-30 MED ORDER — ISOSORBIDE MONONITRATE ER 60 MG PO TB24
60.0000 mg | ORAL_TABLET | Freq: Two times a day (BID) | ORAL | Status: DC
  Administered 2017-03-30: 60 mg via ORAL
  Filled 2017-03-30: qty 1

## 2017-03-30 MED ORDER — LOSARTAN POTASSIUM 25 MG PO TABS: 12.5000 mg | ORAL_TABLET | Freq: Every day | ORAL | Status: DC

## 2017-03-30 MED ORDER — RANOLAZINE ER 500 MG PO TB12
1000.0000 mg | ORAL_TABLET | Freq: Two times a day (BID) | ORAL | Status: DC
  Administered 2017-03-30: 1000 mg via ORAL
  Filled 2017-03-30: qty 2

## 2017-03-30 NOTE — Evaluation (Signed)
Clinical/Bedside Swallow Evaluation Patient Details  Name: Sharma CovertRoyce C Admire MRN: [id number] Date of Birth: 10/13/1945  Today's Date: 03/30/2017 Time: SLP Start Time (ACUTE ONLY): 1005 SLP Stop Time (ACUTE ONLY): 1015 SLP Time Calculation (min) (ACUTE ONLY): 10 min  Past Medical History:  Past Medical History:  Diagnosis Date  . CAD (coronary artery disease) of artery bypass graft 12/09/2013  . Coronary artery disease   . Diabetes mellitus   . Hearing loss    wears bilateral hearing aids  . History of nephrolithiasis   . Hypertension   . Kidney stone   . MI (myocardial infarction) (HCC) 1985   (several)  . Stroke [facility](HCC)    Past Surgical History:  Past Surgical History:  Procedure Laterality Date  . AV FISTULA REPAIR  2005  . broken wrist surgery Left 2001  . CAROTID ENDARTERECTOMY    . CORONARY ARTERY BYPASS GRAFT  1996   5 vessels  . KIDNEY STONE SURGERY    . WRIST SURGERY     HPI:  Alfonzo BeersRoyce C Bloweis an 71 y.o.malewhite right-handed male with past medical history of hypertension, coronary artery disease status post CABG, sleep apnea,prior CVA status post CEA with slurred speech and confusion. His wife states that about 2 days ago patient was acting more confused. She thought it may have been due to his hearing aids not working. On Tuesday the patient hadhearing aids fixed and wife noticed his speech was slurred and he also had gait instability.Today morning the patient was better and no longer had slurred speech.She has hesitated bringing him to the hospital howeverdecided to rule out stroke.The patient was admitted for TIA workup. MRI brain showed no evidence of an acute stroke and MR angiogram was negative for any significant stenosis. Metabolic workup revealed the patient had a urinary tract infection.Patient didendorse urinary frequency and has a previous history of renal stones. Pt failed stroke swallow screen and BSE was requested.    Assessment / Plan /  Recommendation Clinical Impression  Pt appears to be at reduced risk of aspiration when following general aspiration precautions while consuming regular diet with thin liquids. Pt consumed trials of regular and thin liquids via straw without overt s/s of aspiration. No skilled ST indicated at this time. ST to sign off.  SLP Visit Diagnosis: Dysphagia, unspecified (R13.10)    Aspiration Risk  No limitations    Diet Recommendation Regular;Thin liquid   Liquid Administration via: Cup;Straw Medication Administration: Whole meds with liquid Supervision: Patient able to self feed Compensations: Minimize environmental distractions;Slow rate;Small sips/bites Postural Changes: Seated upright at 90 degrees    Other  Recommendations Oral Care Recommendations: Oral care BID   Follow up Recommendations None        Swallow Study   General Date of Onset: 03/29/17 HPI: Alfonzo BeersRoyce C Bloweis an 71 y.o.malewhite right-handed male with past medical history of hypertension, coronary artery disease status post CABG, sleep apnea,prior CVA status post CEA with slurred speech and confusion. His wife states that about 2 days ago patient was acting more confused. She thought it may have been due to his hearing aids not working. On Tuesday the patient hadhearing aids fixed and wife noticed his speech was slurred and he also had gait instability.Today morning the patient was better and no longer had slurred speech.She has hesitated bringing him to the hospital howeverdecided to rule out stroke.The patient was admitted for TIA workup. MRI brain showed no evidence of an acute stroke and MR angiogram was negative for any  significant stenosis. Metabolic workup revealed the patient had a urinary tract infection.Patient didendorse urinary frequency and has a previous history of renal stones. Pt failed stroke swallow screen and BSE was requested.  Type of Study: Bedside Swallow Evaluation Previous Swallow Assessment:  none in chart Diet Prior to this Study: Regular;Thin liquids Temperature Spikes Noted: No Respiratory Status: Room air History of Recent Intubation: No Behavior/Cognition: Alert;Cooperative;Pleasant mood Oral Cavity Assessment: Within Functional Limits Oral Care Completed by SLP: No Oral Cavity - Dentition: Adequate natural dentition Vision: Functional for self-feeding Self-Feeding Abilities: Able to feed self Patient Positioning: Upright in bed Baseline Vocal Quality: Normal Volitional Cough: Strong Volitional Swallow: Able to elicit    Oral/Motor/Sensory Function Overall Oral Motor/Sensory Function: Within functional limits   Ice Chips Ice chips: Within functional limits Presentation: Self Fed;Cup   Thin Liquid Thin Liquid: Within functional limits Presentation: Cup;Self Fed;Straw    Nectar Thick Nectar Thick Liquid: Not tested   Honey Thick Honey Thick Liquid: Not tested   Puree Puree: Within functional limits Presentation: Self Fed;Spoon   Solid   GO   Solid: Within functional limits Presentation: Self Fed;Spoon    Functional Assessment Tool Used: skilled clinical observation Functional Limitations: Swallowing Swallow Current Status (E9528(G8996): 0 percent impaired, limited or restricted Swallow Goal Status (U1324(G8997): 0 percent impaired, limited or restricted Swallow Discharge Status (M0102(G8998): 0 percent impaired, limited or restricted   Yu Peggs 03/30/2017,11:00 AM

## 2017-03-30 NOTE — Discharge Summary (Signed)
Physician Discharge Summary  [name] ZOX:[id number] DOB: [birth year] DOA: 03/29/2017  PCP: Gertie Gowda, MD  Admit date: 03/29/2017 Discharge date: 03/30/2017  Time spent: 35 minutes  Recommendations for Outpatient Follow-up:  1. Repeat basic metabolic panel to reassess electrolytes and renal function 2. Reassess blood pressure and adjust antihypertensive regimen as needed. 3. Please follow final urine culture results and modified antibiotic therapy if needed.   Discharge Diagnoses:  Principal Problem:   Acute lower UTI Active Problems:   TIA (transient ischemic attack)   Stroke-like symptoms   AKI (acute kidney injury) (HCC)   Diabetes mellitus with nephropathy (HCC)   CKD stage 3 due to type 2 diabetes mellitus (HCC)   Benign essential HTN   Coronary artery disease involving native coronary artery of native heart without angina pectoris   Discharge Condition: Stable and improved.  Patient discharged home with instruction to follow-up with PCP in 10 days.  Diet recommendation: Heart healthy and modified carbohydrate diet.  History of present illness:  As per H&P written by Dr. Katrinka Blazing on 03/29/17. 71 y.o. male with medical history significant of CVA w/ residual right-sided facial droop, CAD s/p CABG, HTN, hearing loss, nephrolithiasis, and dementia; who presented with complaints of slurred speech, confusion, and unsteady gait.  History is mostly obtained from the patient's wife as he is significantly hard of hearing and is still confused.  The patient's hearing aid had been broken 12/16-12/17, and were fixed at the [facility] on 12/18.  The patient's wife notes that she had not been speaking to him much as he is very hard of hearing, but after the hearing aids were fixed noted that his speech was more slurred than usual and reported that his gait was more unsteady than usual.  She initially related symptoms to issues with his arthritis, but also noted that he was more confused  than baseline.  Patient also had reportedly had increased urinary frequency.  She notes that he has a kidney stone, but does not normally get urinary tract infection.  Hospital Course:  1-strokelike symptoms/TIA, most likely associated with UTI and decrease perfusion in the setting of dehydration. -Symptoms improving/completely resolved with IV fluids and empirical initiation of antibiotic therapy. -Following neurology recommendations and having a negative CT scan, MRI/MRA of the head.  No further workup for stroke or TIA are indicated at this moment as an inpatient. -Continue statins and continue aspirin and Plavix for secondary prevention. -Patient would also benefit of good control of his blood pressure and diabetes.    2-urinary tract infection: -No fever, normal WBCs, normal HR -patient without any nausea vomiting. -Still reporting some increased frequency but denies dysuria. Cultures has remained pending at discharge; following patient and wife's wishes will discharge home on Ceftin by mouth twice a day and instruction to keep himself well-hydrated. -Patient to follow-up with PCP in 10 days.  3-acute kidney injury -In the setting of dehydration, UTI, continue use of nephrotoxic agents (Cozaar). -Patient with significant improvement after fluid resuscitation and essentially back to normal basis -Advised to keep himself well-hydrated, will continue holding for the next 2 days the patient's Cozaar. -Patient has been advised to follow-up with PCP in 10 days to repeat basic metabolic panel and reassess renal function trend.  4-coronary artery disease a status post CABG -No chest pain or shortness of breath -Continue Coreg, ASA, statins, Plavix, Ranexa and imdur -continue outpatient follow up with his cardiologist   5-diabetes mellitus type 2: Currently diet controlled -A1c 5.8 -Continue  modified carbohydrates at discharge  6-hyperlipidemia -Continue statins and fenofibrate    7-obesity: class 1 -Body mass index is 34.21 kg/m. -Low calorie diet and increase exercise has been discussed with patient.  8-essential hypertension -Well-controlled with the use of Coreg and Imdur -continue heart healthy diet   9-history of kidney stone/BPH -Started on Flomax -Patient will complete treatment with antibiotics -Follow-up with urology as an outpatient (already established and actively seeing someone).  Procedures:  See below for x-ray reports.  Consultations:  Neurology  Discharge Exam: Vitals:   03/30/17 0621 03/30/17 0908  BP: 118/65 (!) 135/59  Pulse: 66 77  Resp: 18 20  Temp:  97.7 F (36.5 C)  SpO2: 98% 98%    General: Afebrile, no nausea, no vomiting, denies abdominal pain, no chest pain or shortness of breath.  Patient without any neurologic or motor deficit, able to follow commands appropriately and reporting feeling back to his baseline. Cardiovascular: Positive systolic ejection murmur, no rubs, no gallops, S1 and S2. Respiratory: Good air movement bilaterally, no wheezing, no crackles Abdomen: Soft, nontender, nondistended, positive bowel sounds Extremities: No edema, no cyanosis or clubbing Neurologic: Alert, awake and oriented x3, good judgment and insight, cranial nerves intact, no dysarthria.   Discharge Instructions   Discharge Instructions    Discharge instructions   Complete by:  As directed    Take medications as prescribed Keep yourself well-hydrated Arrange follow-up with PCP in 10 days Follow heart healthy and modified carbohydrates diet Check your weight on daily basis   Increase activity slowly   Complete by:  As directed      Allergies as of 03/30/2017      Reactions   Penicillins Other (See Comments)   From childhood: Has patient had a PCN reaction causing immediate rash, facial/tongue/throat swelling, SOB or lightheadedness with hypotension: Unk Has patient had a PCN reaction causing severe rash involving  mucus membranes or skin necrosis: Unk Has patient had a PCN reaction that required hospitalization: Unk Has patient had a PCN reaction occurring within the last 10 years: No If all of the above answers are "NO", then may proceed with Cephalosporin use.      Medication List    STOP taking these medications   ISOSORBIDE DINITRATE PO     TAKE these medications   acetaminophen 325 MG tablet Commonly known as:  TYLENOL Premedication with tylenol  Po before taking Aggrenox.   ALPRAZolam 0.5 MG tablet Commonly known as:  XANAX Take 1 tablet (0.5 mg total) at bedtime as needed by mouth. What changed:    when to take this  additional instructions   aspirin EC 81 MG tablet Take 81 mg by mouth at bedtime.   atorvastatin 20 MG tablet Commonly known as:  LIPITOR Take 20 mg by mouth at bedtime.   carvedilol 25 MG tablet Commonly known as:  COREG Take 25 mg by mouth 2 (two) times daily with a meal.   cefUROXime 500 MG tablet Commonly known as:  CEFTIN Take 1 tablet (500 mg total) by mouth 2 (two) times daily for 8 days.   clopidogrel 75 MG tablet Commonly known as:  PLAVIX Take 75 mg by mouth every morning.   DULoxetine 60 MG capsule Commonly known as:  CYMBALTA Take 60 mg by mouth at bedtime.   fenofibrate 160 MG tablet Take 160 mg by mouth daily. What changed:  Another medication with the same name was removed. Continue taking this medication, and follow the directions you see here.  HYDROcodone-acetaminophen 5-325 MG tablet Commonly known as:  NORCO/VICODIN Take 1 tablet by mouth every 6 (six) hours as needed for severe pain. What changed:    when to take this  reasons to take this   isosorbide mononitrate 30 MG 24 hr tablet Commonly known as:  IMDUR Take 60 mg by mouth 2 (two) times daily.   LINZESS 145 MCG Caps capsule Generic drug:  linaclotide Take 145 mcg daily by mouth.   losartan 25 MG tablet Commonly known as:  COZAAR Take 0.5 tablets (12.5 mg  total) by mouth daily. Start taking on:  04/01/2017 What changed:    how much to take  These instructions start on 04/01/2017. If you are unsure what to do until then, ask your doctor or other care provider.   magnesium oxide 400 MG tablet Commonly known as:  MAG-OX Take 400 mg by mouth daily.   MULTIVITAMIN PO Take 1 tablet by mouth daily.   nitroGLYCERIN 0.4 MG SL tablet Commonly known as:  NITROSTAT Place 1 tablet (0.4 mg total) under the tongue every 5 (five) minutes as needed for chest pain.   RANEXA 1000 MG SR tablet Generic drug:  ranolazine Take 1,000 mg by mouth 2 (two) times daily.   tamsulosin 0.4 MG Caps capsule Commonly known as:  FLOMAX Take 1 capsule (0.4 mg total) by mouth daily after supper.      Allergies  Allergen Reactions  . Penicillins Other (See Comments)    From childhood: Has patient had a PCN reaction causing immediate rash, facial/tongue/throat swelling, SOB or lightheadedness with hypotension: Unk Has patient had a PCN reaction causing severe rash involving mucus membranes or skin necrosis: Unk Has patient had a PCN reaction that required hospitalization: Unk Has patient had a PCN reaction occurring within the last 10 years: No If all of the above answers are "NO", then may proceed with Cephalosporin use.    Follow-up Information    [name], [name], MD. Schedule an appointment as soon as possible for a visit in 10 day(s).   Specialty:  Family Medicine Contact information: [address] [facility] [place] [phone]           The results of significant diagnostics from this hospitalization (including imaging, microbiology, ancillary and laboratory) are listed below for reference.    Significant Diagnostic Studies: Ct Head Wo Contrast  Result Date: 03/29/2017 CLINICAL DATA:  Unsteady gait, slurred speech EXAM: CT HEAD WITHOUT CONTRAST TECHNIQUE: Contiguous axial images were obtained from the  base of the skull through the vertex without intravenous contrast. COMPARISON:  MRI 12/06/2016 FINDINGS: Brain: No acute territorial infarction, hemorrhage, or intracranial mass is visualized. Moderate subcortical and periventricular white matter hypodensity consistent with small vessel ischemic change. Old left lacunar ganglial capsular infarcts. Mild to moderate atrophy. Stable ventricle size. Vascular: No hyperdense vessels.  Carotid artery calcification. Skull: No fracture or suspicious lesion Sinuses/Orbits: Mild mucosal thickening in the sphenoid and ethmoid sinuses. No acute orbital abnormality. Other: None IMPRESSION: 1. No definite CT evidence for acute intracranial abnormality. 2. Atrophy and small vessel ischemic changes of the white matter. Electronically Signed   By: Jasmine PangKim  Fujinaga [name]   On: 03/29/2017 15:01   Mr Maxine GlennMra Head Wo Contrast  Result Date: 03/30/2017 CLINICAL DATA:  Slurred speech and gait instability. EXAM: MR HEAD WITHOUT CONTRAST MR CIRCLE OF WILLIS WITHOUT CONTRAST MRA OF THE NECK WITHOUT AND WITH CONTRAST TECHNIQUE: Multiplanar, multiecho pulse sequences of the brain, circle of willis and  surrounding structures were obtained without intravenous contrast. Angiographic images of the neck were obtained using MRA technique without and with intravenous contrast. CONTRAST:  20mL MULTIHANCE GADOBENATE DIMEGLUMINE 529 MG/ML IV SOLN COMPARISON:  Head CT 03/29/2017 Brain MRI 11/14/2016 FINDINGS: MRI HEAD FINDINGS Brain: The midline structures are normal. No focal diffusion restriction to indicate acute infarct. No intraparenchymal hemorrhage. There is beginning confluent hyperintense T2-weighted signal within the periventricular and deep white matter, most often seen in the setting of chronic microvascular ischemia. No mass lesion. No chronic microhemorrhage or cerebral amyloid angiopathy. No hydrocephalus, age advanced atrophy or lobar predominant volume loss. No dural abnormality or  extra-axial collection. Skull and upper cervical spine: The visualized skull base, calvarium, upper cervical spine and extracranial soft tissues are normal. Sinuses/Orbits: No fluid levels or advanced mucosal thickening. No mastoid effusion. Normal orbits. MRA HEAD FINDINGS Intracranial internal carotid arteries: Normal. Anterior cerebral arteries: Normal. Middle cerebral arteries: Normal. Posterior communicating arteries: Absent bilaterally pear Posterior cerebral arteries: Normal. Basilar artery: Normal. Vertebral arteries: Left dominant. Normal. Superior cerebellar arteries: Normal. Anterior inferior cerebellar arteries: Normal. Posterior inferior cerebellar arteries: Both originate from the right vertebral artery. MRA NECK FINDINGS Aortic arch: Normal 3 vessel aortic branching pattern. The visualized subclavian arteries are normal. Right carotid system: Normal course and caliber without stenosis or evidence of dissection. Left carotid system: Normal course and caliber without stenosis or evidence of dissection. Vertebral arteries: Left dominant. Normal origin of the left vertebral artery. Poor visualization of the right vertebral artery origin. Vertebral arteries are normal in course and caliber to the vertebrobasilar confluence without stenosis or evidence of dissection. IMPRESSION: 1. No acute intracranial abnormality. 2. Chronic ischemic microangiopathy. 3. No emergent large vessel occlusion or high-grade stenosis. Electronically Signed   By: Deatra Robinson [name]   On: 03/30/2017 00:03   Mr Angiogram Neck W Or Wo Contrast  Result Date: 03/30/2017 CLINICAL DATA:  Slurred speech and gait instability. EXAM: MR HEAD WITHOUT CONTRAST MR CIRCLE OF WILLIS WITHOUT CONTRAST MRA OF THE NECK WITHOUT AND WITH CONTRAST TECHNIQUE: Multiplanar, multiecho pulse sequences of the brain, circle of willis and surrounding structures were obtained without intravenous contrast. Angiographic images of the neck were obtained using  MRA technique without and with intravenous contrast. CONTRAST:  20mL MULTIHANCE GADOBENATE DIMEGLUMINE 529 MG/ML IV SOLN COMPARISON:  Head CT 03/29/2017 Brain MRI 11/14/2016 FINDINGS: MRI HEAD FINDINGS Brain: The midline structures are normal. No focal diffusion restriction to indicate acute infarct. No intraparenchymal hemorrhage. There is beginning confluent hyperintense T2-weighted signal within the periventricular and deep white matter, most often seen in the setting of chronic microvascular ischemia. No mass lesion. No chronic microhemorrhage or cerebral amyloid angiopathy. No hydrocephalus, age advanced atrophy or lobar predominant volume loss. No dural abnormality or extra-axial collection. Skull and upper cervical spine: The visualized skull base, calvarium, upper cervical spine and extracranial soft tissues are normal. Sinuses/Orbits: No fluid levels or advanced mucosal thickening. No mastoid effusion. Normal orbits. MRA HEAD FINDINGS Intracranial internal carotid arteries: Normal. Anterior cerebral arteries: Normal. Middle cerebral arteries: Normal. Posterior communicating arteries: Absent bilaterally pear Posterior cerebral arteries: Normal. Basilar artery: Normal. Vertebral arteries: Left dominant. Normal. Superior cerebellar arteries: Normal. Anterior inferior cerebellar arteries: Normal. Posterior inferior cerebellar arteries: Both originate from the right vertebral artery. MRA NECK FINDINGS Aortic arch: Normal 3 vessel aortic branching pattern. The visualized subclavian arteries are normal. Right carotid system: Normal course and caliber without stenosis or evidence of dissection. Left carotid system: Normal course and caliber without stenosis  or evidence of dissection. Vertebral arteries: Left dominant. Normal origin of the left vertebral artery. Poor visualization of the right vertebral artery origin. Vertebral arteries are normal in course and caliber to the vertebrobasilar confluence without  stenosis or evidence of dissection. IMPRESSION: 1. No acute intracranial abnormality. 2. Chronic ischemic microangiopathy. 3. No emergent large vessel occlusion or high-grade stenosis. Electronically Signed   By: Deatra Robinson [name]   On: 03/30/2017 00:03   Mr Brain Wo Contrast  Result Date: 03/30/2017 CLINICAL DATA:  Slurred speech and gait instability. EXAM: MR HEAD WITHOUT CONTRAST MR CIRCLE OF WILLIS WITHOUT CONTRAST MRA OF THE NECK WITHOUT AND WITH CONTRAST TECHNIQUE: Multiplanar, multiecho pulse sequences of the brain, circle of willis and surrounding structures were obtained without intravenous contrast. Angiographic images of the neck were obtained using MRA technique without and with intravenous contrast. CONTRAST:  20mL MULTIHANCE GADOBENATE DIMEGLUMINE 529 MG/ML IV SOLN COMPARISON:  Head CT 03/29/2017 Brain MRI 11/14/2016 FINDINGS: MRI HEAD FINDINGS Brain: The midline structures are normal. No focal diffusion restriction to indicate acute infarct. No intraparenchymal hemorrhage. There is beginning confluent hyperintense T2-weighted signal within the periventricular and deep white matter, most often seen in the setting of chronic microvascular ischemia. No mass lesion. No chronic microhemorrhage or cerebral amyloid angiopathy. No hydrocephalus, age advanced atrophy or lobar predominant volume loss. No dural abnormality or extra-axial collection. Skull and upper cervical spine: The visualized skull base, calvarium, upper cervical spine and extracranial soft tissues are normal. Sinuses/Orbits: No fluid levels or advanced mucosal thickening. No mastoid effusion. Normal orbits. MRA HEAD FINDINGS Intracranial internal carotid arteries: Normal. Anterior cerebral arteries: Normal. Middle cerebral arteries: Normal. Posterior communicating arteries: Absent bilaterally pear Posterior cerebral arteries: Normal. Basilar artery: Normal. Vertebral arteries: Left dominant. Normal. Superior cerebellar arteries:  Normal. Anterior inferior cerebellar arteries: Normal. Posterior inferior cerebellar arteries: Both originate from the right vertebral artery. MRA NECK FINDINGS Aortic arch: Normal 3 vessel aortic branching pattern. The visualized subclavian arteries are normal. Right carotid system: Normal course and caliber without stenosis or evidence of dissection. Left carotid system: Normal course and caliber without stenosis or evidence of dissection. Vertebral arteries: Left dominant. Normal origin of the left vertebral artery. Poor visualization of the right vertebral artery origin. Vertebral arteries are normal in course and caliber to the vertebrobasilar confluence without stenosis or evidence of dissection. IMPRESSION: 1. No acute intracranial abnormality. 2. Chronic ischemic microangiopathy. 3. No emergent large vessel occlusion or high-grade stenosis. Electronically Signed   By: Deatra Robinson [name]   On: 03/30/2017 00:03   Labs: Basic Metabolic Panel: Recent Labs  Lab 03/29/17 1353 03/29/17 1412 03/30/17 0414  NA 131* 139 137  K 3.8 4.0 3.9  CL 97* 98* 105  CO2 27  --  27  GLUCOSE 176* 175* 106*  BUN 20 22* 19  CREATININE 1.42* 1.40* 1.29*  CALCIUM 9.2  --  8.7*   Liver Function Tests: Recent Labs  Lab 03/29/17 1353  AST 24  ALT 16*  ALKPHOS 37*  BILITOT 0.7  PROT 6.4*  ALBUMIN 3.2*   CBC: Recent Labs  Lab 03/29/17 1353 03/29/17 1412 03/30/17 0414  WBC 8.5  --  7.3  NEUTROABS 5.9  --   --   HGB 12.4* 12.6* 11.4*  HCT 36.1* 37.0* 32.9*  MCV 90.5  --  91.6  PLT 364  --  335    CBG: Recent Labs  Lab 03/29/17 1402 03/30/17 0223 03/30/17 0631  GLUCAP 170* 107* 101*    Signed:  Vassie Lollarlos Eula Jaster MD.  [facility] 03/30/2017, 11:28 AM

## 2017-03-30 NOTE — Progress Notes (Signed)
Pharmacy Antibiotic Note  Sharma CovertRoyce C Bayne is a 71 y.o. male admitted on 03/29/2017 with UTI.  Pharmacy has been consulted for Rocephin dosing.  Plan: Rocephin 1g IV Q24H.  Pharmacy will sign off.  Height: 5\' 8"  (172.7 cm) IBW/kg (Calculated) : 68.4  Temp (24hrs), Avg:97.6 F (36.4 C), Min:97.4 F (36.3 C), Max:97.7 F (36.5 C)  Recent Labs  Lab 03/29/17 1353 03/29/17 1412 03/30/17 0414  WBC 8.5  --  7.3  CREATININE 1.42* 1.40* 1.29*    CrCl cannot be calculated (Unknown ideal weight.).    Allergies  Allergen Reactions  . Penicillins Other (See Comments)    From childhood: Has patient had a PCN reaction causing immediate rash, facial/tongue/throat swelling, SOB or lightheadedness with hypotension: Unk Has patient had a PCN reaction causing severe rash involving mucus membranes or skin necrosis: Unk Has patient had a PCN reaction that required hospitalization: Unk Has patient had a PCN reaction occurring within the last 10 years: No If all of the above answers are "NO", then may proceed with Cephalosporin use.      Thank you for allowing pharmacy to be a part of this patient's care.  Vernard GamblesVeronda Jaber Dunlow, PharmD, BCPS  03/30/2017 5:42 AM

## 2017-03-30 NOTE — Consult Note (Signed)
Requesting Physician: Dr. Verdie MosherLiu    Chief Complaint: Stroke   History obtained from: Patient and Chart    HPI:                                                                                                                                       [name] is an 71 y.o. male white right-handed male with past medical history of hypertension, coronary artery disease status post CABG, sleep apnea, prior CVA status post CEA with slurred speech and confusion.   His wife states that about 2 days ago patient was acting more confused. She thought it may have been due to his hearing aids not working. On Tuesday the patient had hearing aids fixed and wife noticed his speech was slurred and he also had gait instability. Today morning the patient was better and no longer had slurred speech. She has hesitated bringing him to the hospital however decided to rule out stroke.  The patient was admitted for TIA workup. MRI brain showed no evidence of an acute stroke and MR angiogram was negative for any significant stenosis. Metabolic workup revealed the patient had a urinary tract infection. Patient did endorse urinary frequency and has a previous history of renal stones.    Past Medical History:  Diagnosis Date  . CAD (coronary artery disease) of artery bypass graft 12/09/2013  . Coronary artery disease   . Diabetes mellitus   . Hearing loss    wears bilateral hearing aids  . History of nephrolithiasis   . Hypertension   . Kidney stone   . MI (myocardial infarction) (HCC) 1985   (several)  . Stroke [facility](HCC)     Past Surgical History:  Procedure Laterality Date  . AV FISTULA REPAIR  2005  . broken wrist surgery Left 2001  . CAROTID ENDARTERECTOMY    . CORONARY ARTERY BYPASS GRAFT  1996   5 vessels  . KIDNEY STONE SURGERY    . WRIST SURGERY      Family History  Problem Relation Age of Onset  . Heart disease Mother   . Liver cancer Unknown   . Parkinson's disease Unknown   . Heart disease  Brother   . Parkinsonism Sister   . Cancer Sister    Social History:  reports that he quit smoking about 33 years ago. he has never used smokeless tobacco. He reports that he drinks alcohol. He reports that he does not use drugs.  Allergies:  Allergies  Allergen Reactions  . Penicillins Other (See Comments)    From childhood: Has patient had a PCN reaction causing immediate rash, facial/tongue/throat swelling, SOB or lightheadedness with hypotension: Unk Has patient had a PCN reaction causing severe rash involving mucus membranes or skin necrosis: Unk Has patient had a PCN reaction that required hospitalization: Unk Has patient had a PCN reaction occurring within the last 10 years: No If all of the  above answers are "NO", then may proceed with Cephalosporin use.     Medications:                                                                                                                        I reviewed home medications   ROS:                                                                                                                                     14 systems reviewed and negative except above    Examination:                                                                                                      General: Appears well-developed and well-nourished.  Psych: Affect appropriate to situation Eyes: No scleral injection HENT: No OP obstrucion Head: Normocephalic.  Cardiovascular: Normal rate and regular rhythm.  Respiratory: Effort normal and breath sounds normal to anterior ascultation GI: Soft.  No distension. There is no tenderness.  Skin: WDI   Neurological Examination Mental Status: Alert, oriented, thought content appropriate.  Speech fluent without evidence of aphasia. Able to follow 3 step commands without difficulty. Cranial Nerves: II: Visual fields grossly normal,  III,IV, VI: ptosis not present, extra-ocular motions intact bilaterally, pupils  equal, round, reactive to light and accommodation V,VII: smile symmetric, facial light touch sensation normal bilaterally VIII: hearing reduced bilaterally IX,X: uvula rises symmetrically XI: bilateral shoulder shrug XII: midline tongue extension Motor: Right : Upper extremity   5/5    Left:     Upper extremity   5/5  Lower extremity   5/5     Lower extremity   5/5 Tone and bulk:normal tone throughout; no atrophy noted Sensory: Pinprick and light touch intact throughout, bilaterally Deep Tendon Reflexes: 2+ and symmetric throughout Plantars: Right: downgoing   Left: downgoing Cerebellar: normal finger-to-nose, normal rapid alternating movements and normal heel-to-shin test Gait: normal gait and station     Lab  Results: Basic Metabolic Panel: Recent Labs  Lab 03/29/17 1353 03/29/17 1412  NA 131* 139  K 3.8 4.0  CL 97* 98*  CO2 27  --   GLUCOSE 176* 175*  BUN 20 22*  CREATININE 1.42* 1.40*  CALCIUM 9.2  --     CBC: Recent Labs  Lab 03/29/17 1353 03/29/17 1412 03/30/17 0414  WBC 8.5  --  7.3  NEUTROABS 5.9  --   --   HGB 12.4* 12.6* 11.4*  HCT 36.1* 37.0* 32.9*  MCV 90.5  --  91.6  PLT 364  --  335    Coagulation Studies: Recent Labs    03/29/17 1353  LABPROT 13.5  INR 1.04    Imaging: Ct Head Wo Contrast  Result Date: 03/29/2017 CLINICAL DATA:  Unsteady gait, slurred speech EXAM: CT HEAD WITHOUT CONTRAST TECHNIQUE: Contiguous axial images were obtained from the base of the skull through the vertex without intravenous contrast. COMPARISON:  MRI 12/06/2016 FINDINGS: Brain: No acute territorial infarction, hemorrhage, or intracranial mass is visualized. Moderate subcortical and periventricular white matter hypodensity consistent with small vessel ischemic change. Old left lacunar ganglial capsular infarcts. Mild to moderate atrophy. Stable ventricle size. Vascular: No hyperdense vessels.  Carotid artery calcification. Skull: No fracture or suspicious lesion  Sinuses/Orbits: Mild mucosal thickening in the sphenoid and ethmoid sinuses. No acute orbital abnormality. Other: None IMPRESSION: 1. No definite CT evidence for acute intracranial abnormality. 2. Atrophy and small vessel ischemic changes of the white matter. Electronically Signed   By: Jasmine Pang [name]   On: 03/29/2017 15:01   Mr Maxine Glenn Head Wo Contrast  Result Date: 03/30/2017 CLINICAL DATA:  Slurred speech and gait instability. EXAM: MR HEAD WITHOUT CONTRAST MR CIRCLE OF WILLIS WITHOUT CONTRAST MRA OF THE NECK WITHOUT AND WITH CONTRAST TECHNIQUE: Multiplanar, multiecho pulse sequences of the brain, circle of willis and surrounding structures were obtained without intravenous contrast. Angiographic images of the neck were obtained using MRA technique without and with intravenous contrast. CONTRAST:  20mL MULTIHANCE GADOBENATE DIMEGLUMINE 529 MG/ML IV SOLN COMPARISON:  Head CT 03/29/2017 Brain MRI 11/14/2016 FINDINGS: MRI HEAD FINDINGS Brain: The midline structures are normal. No focal diffusion restriction to indicate acute infarct. No intraparenchymal hemorrhage. There is beginning confluent hyperintense T2-weighted signal within the periventricular and deep white matter, most often seen in the setting of chronic microvascular ischemia. No mass lesion. No chronic microhemorrhage or cerebral amyloid angiopathy. No hydrocephalus, age advanced atrophy or lobar predominant volume loss. No dural abnormality or extra-axial collection. Skull and upper cervical spine: The visualized skull base, calvarium, upper cervical spine and extracranial soft tissues are normal. Sinuses/Orbits: No fluid levels or advanced mucosal thickening. No mastoid effusion. Normal orbits. MRA HEAD FINDINGS Intracranial internal carotid arteries: Normal. Anterior cerebral arteries: Normal. Middle cerebral arteries: Normal. Posterior communicating arteries: Absent bilaterally pear Posterior cerebral arteries: Normal. Basilar artery: Normal.  Vertebral arteries: Left dominant. Normal. Superior cerebellar arteries: Normal. Anterior inferior cerebellar arteries: Normal. Posterior inferior cerebellar arteries: Both originate from the right vertebral artery. MRA NECK FINDINGS Aortic arch: Normal 3 vessel aortic branching pattern. The visualized subclavian arteries are normal. Right carotid system: Normal course and caliber without stenosis or evidence of dissection. Left carotid system: Normal course and caliber without stenosis or evidence of dissection. Vertebral arteries: Left dominant. Normal origin of the left vertebral artery. Poor visualization of the right vertebral artery origin. Vertebral arteries are normal in course and caliber to the vertebrobasilar confluence without stenosis or evidence of dissection. IMPRESSION: 1.  No acute intracranial abnormality. 2. Chronic ischemic microangiopathy. 3. No emergent large vessel occlusion or high-grade stenosis. Electronically Signed   By: Deatra Robinson [name]   On: 03/30/2017 00:03   Mr Angiogram Neck W Or Wo Contrast  Result Date: 03/30/2017 CLINICAL DATA:  Slurred speech and gait instability. EXAM: MR HEAD WITHOUT CONTRAST MR CIRCLE OF WILLIS WITHOUT CONTRAST MRA OF THE NECK WITHOUT AND WITH CONTRAST TECHNIQUE: Multiplanar, multiecho pulse sequences of the brain, circle of willis and surrounding structures were obtained without intravenous contrast. Angiographic images of the neck were obtained using MRA technique without and with intravenous contrast. CONTRAST:  20mL MULTIHANCE GADOBENATE DIMEGLUMINE 529 MG/ML IV SOLN COMPARISON:  Head CT 03/29/2017 Brain MRI 11/14/2016 FINDINGS: MRI HEAD FINDINGS Brain: The midline structures are normal. No focal diffusion restriction to indicate acute infarct. No intraparenchymal hemorrhage. There is beginning confluent hyperintense T2-weighted signal within the periventricular and deep white matter, most often seen in the setting of chronic microvascular ischemia.  No mass lesion. No chronic microhemorrhage or cerebral amyloid angiopathy. No hydrocephalus, age advanced atrophy or lobar predominant volume loss. No dural abnormality or extra-axial collection. Skull and upper cervical spine: The visualized skull base, calvarium, upper cervical spine and extracranial soft tissues are normal. Sinuses/Orbits: No fluid levels or advanced mucosal thickening. No mastoid effusion. Normal orbits. MRA HEAD FINDINGS Intracranial internal carotid arteries: Normal. Anterior cerebral arteries: Normal. Middle cerebral arteries: Normal. Posterior communicating arteries: Absent bilaterally pear Posterior cerebral arteries: Normal. Basilar artery: Normal. Vertebral arteries: Left dominant. Normal. Superior cerebellar arteries: Normal. Anterior inferior cerebellar arteries: Normal. Posterior inferior cerebellar arteries: Both originate from the right vertebral artery. MRA NECK FINDINGS Aortic arch: Normal 3 vessel aortic branching pattern. The visualized subclavian arteries are normal. Right carotid system: Normal course and caliber without stenosis or evidence of dissection. Left carotid system: Normal course and caliber without stenosis or evidence of dissection. Vertebral arteries: Left dominant. Normal origin of the left vertebral artery. Poor visualization of the right vertebral artery origin. Vertebral arteries are normal in course and caliber to the vertebrobasilar confluence without stenosis or evidence of dissection. IMPRESSION: 1. No acute intracranial abnormality. 2. Chronic ischemic microangiopathy. 3. No emergent large vessel occlusion or high-grade stenosis. Electronically Signed   By: Deatra Robinson [name]   On: 03/30/2017 00:03   Mr Brain Wo Contrast  Result Date: 03/30/2017 CLINICAL DATA:  Slurred speech and gait instability. EXAM: MR HEAD WITHOUT CONTRAST MR CIRCLE OF WILLIS WITHOUT CONTRAST MRA OF THE NECK WITHOUT AND WITH CONTRAST TECHNIQUE: Multiplanar, multiecho pulse  sequences of the brain, circle of willis and surrounding structures were obtained without intravenous contrast. Angiographic images of the neck were obtained using MRA technique without and with intravenous contrast. CONTRAST:  20mL MULTIHANCE GADOBENATE DIMEGLUMINE 529 MG/ML IV SOLN COMPARISON:  Head CT 03/29/2017 Brain MRI 11/14/2016 FINDINGS: MRI HEAD FINDINGS Brain: The midline structures are normal. No focal diffusion restriction to indicate acute infarct. No intraparenchymal hemorrhage. There is beginning confluent hyperintense T2-weighted signal within the periventricular and deep white matter, most often seen in the setting of chronic microvascular ischemia. No mass lesion. No chronic microhemorrhage or cerebral amyloid angiopathy. No hydrocephalus, age advanced atrophy or lobar predominant volume loss. No dural abnormality or extra-axial collection. Skull and upper cervical spine: The visualized skull base, calvarium, upper cervical spine and extracranial soft tissues are normal. Sinuses/Orbits: No fluid levels or advanced mucosal thickening. No mastoid effusion. Normal orbits. MRA HEAD FINDINGS Intracranial internal carotid arteries: Normal. Anterior cerebral arteries: Normal. Middle  cerebral arteries: Normal. Posterior communicating arteries: Absent bilaterally pear Posterior cerebral arteries: Normal. Basilar artery: Normal. Vertebral arteries: Left dominant. Normal. Superior cerebellar arteries: Normal. Anterior inferior cerebellar arteries: Normal. Posterior inferior cerebellar arteries: Both originate from the right vertebral artery. MRA NECK FINDINGS Aortic arch: Normal 3 vessel aortic branching pattern. The visualized subclavian arteries are normal. Right carotid system: Normal course and caliber without stenosis or evidence of dissection. Left carotid system: Normal course and caliber without stenosis or evidence of dissection. Vertebral arteries: Left dominant. Normal origin of the left vertebral  artery. Poor visualization of the right vertebral artery origin. Vertebral arteries are normal in course and caliber to the vertebrobasilar confluence without stenosis or evidence of dissection. IMPRESSION: 1. No acute intracranial abnormality. 2. Chronic ischemic microangiopathy. 3. No emergent large vessel occlusion or high-grade stenosis. Electronically Signed   By: Deatra RobinsonKevin  Herman [name]   On: 03/30/2017 00:03     ASSESSMENT AND PLAN  71 y.o. male white right-handed male with past medical history of hypertension, coronary artery disease status post CABG, sleep apnea, prior CVA status post CEA with slurred speech and confusion. MRI brain is negative for acute stroke and MRA is negative for significant stenosis. His UA analysis is positive for urinary tract infection which could explain his symptoms. He sees Dr. Kandyce Rudomeier who is also has sleep neurologist and has been working on for strokes detected on MRI in August. He underwent an echocardiogram recently and I do not think we need to repeat another study. She was planning to have them undergo 30 day heart monitor to screen for atrial fibrillation. He was switched from ASA and Plavix to Aggrenox.  No further  stroke workup needed.   Urinary tract infection Slurred Speech and Confusion- likely due to UTI    No further stroke workup needed as inpatient 30 cardiac monitor on discharge Blood pressure goal of 1 40 /90 or less  Continue Aggrenox and statin   Follow up appointment with patient's neurologist on discharge.   Chiyo Fay [facility] Pager Number [phone]

## 2017-04-01 LAB — URINE CULTURE: Culture: >=100000 — AB

## 2017-04-25 ENCOUNTER — Ambulatory Visit (HOSPITAL_COMMUNITY): Admit: 2017-04-25 | Payer: Medicare Other | Admitting: Cardiology

## 2017-04-25 ENCOUNTER — Encounter (HOSPITAL_COMMUNITY): Payer: Self-pay

## 2017-04-25 SURGERY — LOWER EXTREMITY ANGIOGRAPHY
Anesthesia: LOCAL

## 2017-05-01 ENCOUNTER — Ambulatory Visit (INDEPENDENT_AMBULATORY_CARE_PROVIDER_SITE_OTHER): Payer: Medicare Other | Admitting: Neurology

## 2017-05-01 DIAGNOSIS — I639 Cerebral infarction, unspecified: Secondary | ICD-10-CM

## 2017-05-01 DIAGNOSIS — R0902 Hypoxemia: Secondary | ICD-10-CM | POA: Diagnosis not present

## 2017-05-01 DIAGNOSIS — I25701 Atherosclerosis of coronary artery bypass graft(s), unspecified, with angina pectoris with documented spasm: Secondary | ICD-10-CM

## 2017-05-01 DIAGNOSIS — Z8673 Personal history of transient ischemic attack (TIA), and cerebral infarction without residual deficits: Secondary | ICD-10-CM

## 2017-05-01 DIAGNOSIS — G3184 Mild cognitive impairment, so stated: Secondary | ICD-10-CM

## 2017-05-05 NOTE — Procedures (Signed)
PATIENT'S NAME:  [name], [name] DOB:      1945-11-28      MR#:    [id number]     DATE OF RECORDING: 05/01/2017 REFERRING [name]:  Gertie Gowda, [name] Study Performed:   Baseline Polysomnogram on Oxygen HISTORY:  72 y.o. male patient with history of hypoxemia (desaturation frequency 10/hr.) and snoring, EDS. 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  Cardiology placed him on oxygen, CPAP had been attempted, but was not tolerated years ago. He uses Oxygen at home for the last 2.5 years- he now wants to try CPAP again- needs a new sleep study. The patient endorsed the Epworth Sleepiness Scale at 8 points.   The patient's weight 225 pounds with a height of 69 (inches), resulting in a BMI of 33.3 kg/m2. The patient's neck circumference measured 17 inches.  CURRENT MEDICATIONS: Xanax, Lipitor, Coreg, Aggrenox, Cymbalta, Magnesium, Nitrostat, Renexa, Linzess  PROCEDURE:  This is a multichannel digital polysomnogram utilizing the Somnostar 11.2 system.  Electrodes and sensors were applied and monitored per AASM Specifications.   EEG, EOG, Chin and Limb EMG, were sampled at 200 Hz.  ECG, Snore and Nasal Pressure, Thermal Airflow, Respiratory Effort, CPAP Flow and Pressure, Oximetry was sampled at 50 Hz. Digital video and audio were recorded.      BASELINE STUDY: Lights Out was at 22:32 and Lights On at 05:18.  Total recording time (TRT) was 406.5 minutes, with a total sleep time (TST) of 305 minutes.  The patient's sleep latency was 17 minutes.  REM latency was 62 minutes.  The sleep efficiency was 75.0 %.     SLEEP ARCHITECTURE: WASO (Wake after sleep onset) was 84.5 minutes.  There were 6.5 minutes in Stage N1, 174.5 minutes Stage N2, 56 minutes Stage N3 and 68 minutes in Stage REM.  The percentage of Stage N1 was 2.1%, Stage N2 was 57.2%, Stage N3 was 18.4% and Stage R (REM sleep) was 22.3%.   The arousals were noted as: 28 were spontaneous, 0 were associated with  PLMs, and 0 were associated with respiratory events.  RESPIRATORY ANALYSIS:  There were no respiratory events:  0 obstructive apneas, 0 central apneas and 0 mixed apneas with a total of 0 apneas and an apnea index (AI) of 0 /hour. There were 0 hypopneas with a hypopnea index of 0 /hour. The patient also had 0 respiratory event related arousals (RERAs).     The total APNEA/HYPOPNEA INDEX (AHI) was 0/hour and the total RESPIRATORY DISTURBANCE INDEX was 0 /hour. All sleep non-supine.  OXYGEN SATURATION & C02: Study performed on oxygen 2 l/min.  The Wake baseline 02 saturation was 98%, with the lowest being 94%. Time spent below 89% saturation equaled 0 minutes.   PERIODIC LIMB MOVEMENTS:  The patient had a total of 0 Periodic Limb Movements.  Audio and video analysis did not show any abnormal or unusual movements, behaviors, phonations or vocalizations.  The patient took two bathroom breaks. Moderate Snoring was noted. EKG was in keeping with normal sinus rhythm (NSR), with some irregular beats (PAC/ PVC).  Post-study, the patient indicated that sleep was better (longer) than usual.   IMPRESSION:  1. NO evidence of Central or Obstructive Sleep Apnea. 2. NO evidence of Periodic Limb Movement Disorder (PLMD) 3. Moderate Snoring. 4. Below average sleep efficiency.  RECOMMENDATIONS: No intervention by sleep medicine indicated.   1. A follow up appointment will be scheduled in the Sleep Clinic at  [facility]. The referring provider will be notified of the results.      I certify that I have reviewed the entire raw data recording prior to the issuance of this report in accordance with the Standards of Accreditation of the American Academy of Sleep Medicine (AASM)      Melvyn Novasarmen Shaniquia Brafford, MD   05-05-2017 Diplomat, American Board of Psychiatry and Neurology  Diplomat, American Board of Sleep Medicine Medical Director, [facility]

## 2017-05-09 ENCOUNTER — Telehealth: Payer: Self-pay | Admitting: Neurology

## 2017-05-09 NOTE — Telephone Encounter (Signed)
-----   Message from Melvyn Novasarmen Dohmeier, MD sent at 05/05/2017  3:09 PM EST ----- Notes recorded by Melvyn Novasohmeier, Carmen, MD on 05/05/2017 at 3:08 PM EST IMPRESSION:  1. NO evidence of Central or Obstructive Sleep Apnea. 2. NO evidence of Periodic Limb Movement Disorder (PLMD) 3. Moderate Snoring. 4. Below average sleep efficiency.  RECOMMENDATIONS: No intervention by sleep medicine indicated.

## 2017-05-09 NOTE — Telephone Encounter (Signed)
Called the patient and spoke with his wife in regards to sleep study.i informed her that his study was negative for sleep apnea, low oxygenation, or limb movement. The patient's wife states that he slept with oxygen on for the test. Should he have something done without the oxygen to see if the O2 is needed. I informed her I would make Dr Dohmeier aware of this. It appears he was on 2L O2. I will inform her if Dr Vickey Hugerohmeier thinks something else would need to be completed. pts wife verbalized understanding

## 2017-06-26 ENCOUNTER — Encounter: Payer: Self-pay | Admitting: Neurology

## 2017-06-26 ENCOUNTER — Ambulatory Visit: Payer: Medicare Other | Admitting: Neurology

## 2017-06-26 VITALS — BP 82/52 | HR 70 | Ht 69.0 in | Wt 212.0 lb

## 2017-06-26 DIAGNOSIS — N183 Chronic kidney disease, stage 3 unspecified: Secondary | ICD-10-CM

## 2017-06-26 DIAGNOSIS — I5040 Unspecified combined systolic (congestive) and diastolic (congestive) heart failure: Secondary | ICD-10-CM

## 2017-06-26 DIAGNOSIS — G4734 Idiopathic sleep related nonobstructive alveolar hypoventilation: Secondary | ICD-10-CM | POA: Diagnosis not present

## 2017-06-26 DIAGNOSIS — I952 Hypotension due to drugs: Secondary | ICD-10-CM | POA: Diagnosis not present

## 2017-06-26 DIAGNOSIS — I5032 Chronic diastolic (congestive) heart failure: Secondary | ICD-10-CM | POA: Insufficient documentation

## 2017-06-26 NOTE — Progress Notes (Signed)
SLEEP MEDICINE CLINIC   Provider:  Larey Seat, M D  Referring Provider: Janece Canterbury, MD Primary Care Physician:  Reynold Bowen, MD  Chief Complaint  Patient presents with  . Follow-up    pt was hospitalized in dec for UTI.     HPI:   06-26-2017, [name] is a 72 y.o., married, caucasian , right handed  male , who is seen here in a RV following his last PSG on Oxygen - from  05-01-2017. I have the pleasure of seeing him today in a revisit on 26 June 2017 in the presence of his wife.  Mr [name] was seen in the ED at [facility] and dx with a UTI- but by the time he had undergone MRI and CT brain for confusion.   All negative.  [name] see below underwent a sleep study on 01 May 2017 which was meant to be a split-night polysomnography.  He has significant co-morbidities which I have listed at the history section.  Cardiac ejection fraction, CABG, hypertension, he could not tolerate CPAP 25 years ago and uses oxygen at home only.  The sleep study showed 0.0 apneas, it also showed no oxygen desaturation while on the current Brooks of nocturnal oxygen.  Below average sleep efficiency was noted but no physiologic reasons for arousals correlated to the measured channels.  Sleep efficiency still was 75% which meant that he slept for about 305 minutes at night , just over 5 hours.  He had some irregular beats PACs and PVCs but no evidence of atrial fibrillation. He remained all night on 2 liters of 02. HE doesn't need PAP !  We discuss sleep hygiene today, which affects both spouses. Both have some sleep issues.    I have the pleasure of seeing [name] here today on 24 February 2017 in a revisit.  The patient was diagnosed in August 2018 with a stroke and acute peripheral ischemic focus in the posterior right temporal lobe.  He also had microvascular changes.  Moderate cortical atrophy was noted.  Compensatory ventricular enlargement was seen.MRA showed left internal carotid artery  normal flow both vertebral arteries were with antegrade flow left common carotid artery and bifurcation were patent, all appear to be patent= performed on 06 December 2016. Cognitive evaluation MMSE 28/30 points. He should change to [facility] next time. He walk with a walker. The patient is still on the same medication -  Aggrenox,  The patient indicated he wanted to try a CPAP, instead of just Oxygen at 2 liters . He needs another study- SPLIT study.   Interval history from 11/21/2016. [name] returns today after his MRI from 11/16/2016 documented a fresh, acute right sided brain stroke. In addition the interpretation by Dr. Ladonna Snide suggested the presence of multiple lacunar infarcts throughout the left brain hemisphere. Main risk factor for these blood colons his hypertension, the main risk factor for his right-sided brain stroke is an vasogenic embolism. The still need to check out that there is no blood clot sitting in his heart, we will order an power Doppler study, if this is negative I would suggest a cardiac monitor to rule out A. fib and I suggest to change him for now on Plavix and aspirin to Aggrenox. Patients on Aggrenox which is take it twice a day, often develop headaches, I usually premedicate with Tylenol. He still resides in [facility], and he comes back after 2.5 years . He wears hearing aids, and uses a seated  walker. He was followed in the sleep clinic 2015 , but needs now  a memory evaluation , he felt. In the meantime he has developed kidney stones, urinary dribbling, sudden gait disturbance and his wife felt there was a sudden decline- in late April 2018 He continues to use oxygen instead of using CPAP - which he could not tolerate.  His brother in law, an internist, suspected NPH could be present. He has been seen at [facility] in [place], [place] and had back MRIs there.    06-2013 original referral  from Dr. Einar Brooks and NP [name] for a sleep evaluation,[name] was referred after  developing angina pectoris with known coronary artery disease, hearing loss and status post CABG . He had a coronary angiogram on 01-01-13 documenting kinesis of the posterior basal segment of the heart with an EF of 40%, mild mitral regurgitation.Right dominant circulation or carotid proximal circumflex and TG toward ICA occluded as well. Diffuse disease in other parts.  The saphenous vein graft from 1996 when he underwent CABG shows some diffuse disease as well. The patient continues to take all antiplatelet therapy as an HbA1c of 5.3,  a total cholesterol of 156.  Additional risk factor management we'll address the possibility of the patient having obstructive sleep apnea.  Dr. Filbert Brooks had ordered a nocturnal pulse oximetry,  which documented 16 minutes at or below 88% saturation , and 31 minutes of less than 89% saturation - desaturation events were 10/hr.  The patient was started by Dr. Darnell Brooks. on oxygen supplementation at 2-1/2 L per minute. The patient states that he feels the best all day close to his bed-time, in return delays going to bed frequently.  Bedtime is  2 AM and he will need 1 to 60 minutes to go to sleep. He takes 2 xanax every night,  Has done for decades.  He used to nap well in his recliner, but now wakes up with angina. He is concerned about his angina when asleep in supine. He goes to sleep on his side, or prone, but wakes up on his back.  He has chronic nasal congestion, he has undergone a septal deviation repair but remains restricted in nasal airflow. He has a mustache. He stopped smoking October 20 1983, after smoking since 1962. He rarely drinks ETOH.  He believes he had never had a stroke, but underwent a Carotid End arterectomy  on the left with a " TIA" , January 2015.  The patient lives In [place] , [place].   01-15-14 Interval history :  [name] returns after his sleep study , which he found enjoyable. The sleep study took place on 01-01-14 the patient had endorsed CPAP for a  score of at 6 points and the fatigue score at 48 points. He is also endorsed the pH Q8 14 points which indicates depression. His AHI was 7.5 which is truly a mild degree his RDI 8.4 in rem sleep his apnea index increased is 33 the pylorus sleep. His lowest oxygen saturation was 82% and is a total time of 15.4 minutes. His heart rate was irregular. I suggested that there could be two treatment options.  #1  REM sleep suppressant medication that would decrease apnea since it was REM dependent and off on the CPAP treatment as longer necessary.  Patient chose cymbalta .   #2 if the patient is loudly snoring and feels that his respiratory disturbance he is high CPAP should be attempted at a lower pressure. REM dependent  apnea, patient would like to treat with REM supressent medication. In this case he started Cymbalta.  CO2 retention. Patient is on Oxygen at home, continue O2 supplemented at 2.5 litres. 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 He continues cardio rehab, Interval history to-10-16, the patient reports that the Cymbalta has helped him to sleep deeper and sounder. Cymbalta is used as a REM sleep suppressant in his case as he had REM dependent sleep apnea. His wife added that he is also less panicky less anxious. He noticed less anxiety. He still sleeps sounder and falls asleep quicker than he used to. Sleep latency is now less than an hour and maybe 30 minutes. He will have one nocturia.He does not have any headaches in the morning.    Review of Systems: Out of a complete 14 system review, the patient complains of only the following symptoms, and all other reviewed systems are negative. Sleepiness , fatigue, hearing loss, dysphonia of voice.  Dysphagia - Gait  disorder- thought to be orthopedic.  forgetfulnes- now nocturia.  Epworth score 7 , Fatigue severity score 48 , depression score 5/ 15    Social History   Socioeconomic History  . Marital status: Married    Spouse name: [name]  . Number of  children: 0  . Years of education: Grad. scho  . Highest education Brooks: Not on file  Social Needs  . Financial resource strain: Not on file  . Food insecurity - worry: Not on file  . Food insecurity - inability: Not on file  . Transportation needs - medical: Not on file  . Transportation needs - non-medical: Not on file  Occupational History  . Not on file  Tobacco Use  . Smoking status: Former Smoker    Last attempt to quit: 10/20/1983    Years since quitting: 33.7  . Smokeless tobacco: Never Used  Substance and Sexual Activity  . Alcohol use: Yes    Comment: occas.  . Drug use: No  . Sexual activity: Not on file  Other Topics Concern  . 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   Patient is retired.   Patient has a Sports coach.   Patient is right-handed.   Patient drinks 3-4 cups of coffee and three cups of soda/tea per day.    Past Medical History:  Diagnosis Date  . CAD (coronary artery disease) of artery bypass graft 12/09/2013  . Coronary artery disease   . Diabetes mellitus   . Hearing loss    wears bilateral hearing aids  . History of nephrolithiasis   . Hypertension   . Kidney stone   . MI (myocardial infarction) ([place]) 1985   (several)  . Stroke [facility])     Past Surgical History:  Procedure Laterality Date  . AV FISTULA REPAIR  2005  . broken wrist surgery Left 2001  . CAROTID ENDARTERECTOMY    . CORONARY ARTERY BYPASS GRAFT  1996   5 vessels  . KIDNEY STONE SURGERY    . WRIST SURGERY      Current Outpatient Medications  Medication Sig Dispense Refill  . ALPRAZolam (XANAX) 0.5 MG tablet Take 1 tablet (0.5 mg total) at bedtime as needed by mouth. (Patient taking differently: Take 0.5 mg by mouth See admin instructions. 0.5 mg two times a day as needed for anxiety and 0.5 mg at bedtime SCHEDULED) 45 tablet 0  . aspirin EC 81 MG tablet Take 81 mg  by mouth at bedtime.    Marland Kitchen  atorvastatin (LIPITOR) 20 MG tablet Take 20 mg by mouth at bedtime.    . carvedilol (COREG) 25 MG tablet Take 25 mg by mouth 2 (two) times daily with a meal.    . clopidogrel (PLAVIX) 75 MG tablet Take 75 mg by mouth every morning.     . DULoxetine (CYMBALTA) 60 MG capsule Take 60 mg by mouth at bedtime.    . fenofibrate 160 MG tablet Take 160 mg by mouth daily.    . isosorbide mononitrate (IMDUR) 30 MG 24 hr tablet Take 60 mg by mouth 2 (two) times daily.    Marland Kitchen LINZESS 145 MCG CAPS capsule Take 145 mcg daily by mouth.  12  . losartan (COZAAR) 25 MG tablet Take 0.5 tablets (12.5 mg total) by mouth daily.    . magnesium oxide (MAG-OX) 400 MG tablet Take 400 mg by mouth daily.    . Multiple Vitamins-Minerals (MULTIVITAMIN PO) Take 1 tablet by mouth daily.    . nitroGLYCERIN (NITROSTAT) 0.4 MG SL tablet Place 1 tablet (0.4 mg total) under the tongue every 5 (five) minutes as needed for chest pain. 25 tablet 2  . ranolazine (RANEXA) 1000 MG SR tablet Take 1,000 mg by mouth 2 (two) times daily.      No current facility-administered medications for this visit.     Allergies as of 06/26/2017 - Review Complete 06/26/2017  Allergen Reaction Noted  . Penicillins Other (See Comments) 03/14/2011    Vitals: Ht '5\' 9"'$  (1.753 m)   Wt 212 lb (96.2 kg)   BMI 31.31 kg/m  Last Weight:  Wt Readings from Last 1 Encounters:  06/26/17 212 lb (96.2 kg)       Last Height:   Ht Readings from Last 1 Encounters:  06/26/17 '5\' 9"'$  (1.753 m)    Physical exam:  General: The patient is awake, alert and appears not in acute distress.  Head: Normocephalic, atraumatic. Neck is supple. Mallampati 3 with a short airway, elongate uvula and tonsils, his uvula deviated to the left.   neck circumference: 17 ". Nasal airflow restricted , TMJ is not  evident . Retrognathia is not seen.  Cardiovascular:  Regular rate and rhyth, without  murmurs or carotid bruit, and without distended neck veins. Respiratory: Lungs are clear  to auscultation. Skin:  Without evidence of edema, or rash Trunk: BMI is elevated and patient  has normal posture.  Neurologic exam : The patient is awake and alert, oriented to place and time.   Memory subjective  described as intact. There is a normal attention span & concentration ability. Speech is fluent with dysphonia . Mood and affect are appropriate. MMSE - Mini Mental State Exam 02/24/2017 10/24/2016  Orientation to time 4 4  Orientation to time comments - stated wrong date. 15th instead of 16th  Orientation to Place 5 5  Registration 3 3  Attention/ Calculation 5 5  Recall 3 3  Language- name 2 objects 2 2  Language- repeat 1 1  Language- follow 3 step command 2 3  Language- read & follow direction 1 1  Write a sentence 1 1  Copy design 1 1  Total score 28 29   27/30 - missed 2 out of 3 recall words. Will change to [facility] next visit.    Cranial nerves:Pupils are equal and briskly reactive to light. Visual fields by finger perimetry are intact. Hearing to finger rub intact.  Facial sensation intact to fine touch.  Facial motor strength : has a droop of the right face and equal tongue  movement. Equal shoulder shrug  Motor exam:  Elevated tone on the right arm, pronator drift and dysmetria. - this was present in 2015  Gait and station: Patient walks with a walker- that was not the case when last seen.   Deep tendon reflexes: in the  upper and lower extremities are symmetric and intact. Babinski maneuver response is downgoing. CLINICAL DATA:  Slurred speech and gait instability.  EXAM: MR HEAD WITHOUT CONTRAST  MR CIRCLE OF WILLIS WITHOUT CONTRAST  MRA OF THE NECK WITHOUT AND WITH CONTRAST  TECHNIQUE: Multiplanar, multiecho pulse sequences of the brain, circle of willis and surrounding structures were obtained without intravenous contrast. Angiographic images of the neck were obtained using MRA technique without and with intravenous contrast.  CONTRAST:  74m  MULTIHANCE GADOBENATE DIMEGLUMINE 529 MG/ML IV SOLN  COMPARISON:  Head CT 03/29/2017  Brain MRI 11/14/2016  FINDINGS: MRI HEAD FINDINGS  Brain: The midline structures are normal. No focal diffusion restriction to indicate acute infarct. No intraparenchymal hemorrhage. There is beginning confluent hyperintense T2-weighted signal within the periventricular and deep white matter, most often seen in the setting of chronic microvascular ischemia. No mass lesion. No chronic microhemorrhage or cerebral amyloid angiopathy. No hydrocephalus, age advanced atrophy or lobar predominant volume loss. No dural abnormality or extra-axial collection.  Skull and upper cervical spine: The visualized skull base, calvarium, upper cervical spine and extracranial soft tissues are normal.  Sinuses/Orbits: No fluid levels or advanced mucosal thickening. No mastoid effusion. Normal orbits.  MRA HEAD FINDINGS  Intracranial internal carotid arteries: Normal.  Anterior cerebral arteries: Normal.  Middle cerebral arteries: Normal.  Posterior communicating arteries: Absent bilaterally pear  Posterior cerebral arteries: Normal.  Basilar artery: Normal.  Vertebral arteries: Left dominant. Normal.  Superior cerebellar arteries: Normal.  Anterior inferior cerebellar arteries: Normal.  Posterior inferior cerebellar arteries: Both originate from the right vertebral artery.  MRA NECK FINDINGS  Aortic arch: Normal 3 vessel aortic branching pattern. The visualized subclavian arteries are normal.  Right carotid system: Normal course and caliber without stenosis or evidence of dissection.  Left carotid system: Normal course and caliber without stenosis or evidence of dissection.  Vertebral arteries: Left dominant. Normal origin of the left vertebral artery. Poor visualization of the right vertebral artery origin. Vertebral arteries are normal in course and caliber to  the vertebrobasilar confluence without stenosis or evidence of dissection.  IMPRESSION: 1. No acute intracranial abnormality. 2. Chronic ischemic microangiopathy. 3. No emergent large vessel occlusion or high-grade stenosis.   Electronically Signed   By: KUlyses JarredM.D.   On: 03/30/2017 00:03     Assessment:  After physical and neurologic examination, review of laboratory studies, imaging, neurophysiology testing and pre-existing records, assessment is   1) new stroke - right - accounts  for the gait instability, confusion/ memory loss and his urinary problems. His stroke could be off arousal embolic origin, he has an echocardiogram scheduled with Dr. GWillaim Baneand 14 days, I would love to have an additional carotid ultrasound study, and to discuss change to Aggrenox from the current medication regimen of Plavix and aspirin. 2) MRI did not support NPH.    3) No CPAP needed. We used Cymbalta as a REM Supressant. He uses Oxygen at home for the last 2.5 years- he now wants to try CPAP - needs a new sleep study. Ordered a SPLIT.   4) He likely had a previous  stroke and not a TIA in January 2015. MRI confirmed this- he has lacunes throughout the left brain.  New stroke risk based on EF 40 % , CHF due to CAD , dyskinetic Heart.  5) this is short term memory loss -  Early vascular dementia ?    Prolonged Re- Visit duration was 25 minutes.   Plan:  Treatment plan and additional workup :   1) Stroke prevention-   Dr [name] met with him on 03-06-2017 and reduced HTN medication. He presented here on 06-26-2017, very hypotensive today, had just been at Dr Baldwin Crown office at 10 AM and was 637 mmHg systolic there, but  89 mmHg here ! He looks fine and is not fainty.    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 He needs sleep hygiene instruction.    3) continue oxygen - no CPAP need.   Larey Seat MD   ABPN, ABSM and Fellow of the AASM and AAN.   06/26/2017

## 2017-06-26 NOTE — Patient Instructions (Signed)
*   PLEASE SEND PATIENT A 2 WEEK SLEEP DIARY TO FILL OUT BEFORE THE NEXT APPOINTMENT, SO I CAN REVIEW AT THE TIME OF THE APPOINTMENT *  

## 2017-09-26 ENCOUNTER — Other Ambulatory Visit: Payer: Self-pay | Admitting: Neurology

## 2017-09-26 DIAGNOSIS — G4731 Primary central sleep apnea: Secondary | ICD-10-CM

## 2017-11-07 ENCOUNTER — Encounter: Payer: Self-pay | Admitting: Neurology

## 2017-11-07 ENCOUNTER — Ambulatory Visit: Payer: Medicare Other | Admitting: Neurology

## 2017-11-07 VITALS — BP 121/65 | HR 67 | Ht 69.0 in | Wt 218.0 lb

## 2017-11-07 DIAGNOSIS — G3184 Mild cognitive impairment, so stated: Secondary | ICD-10-CM

## 2017-11-07 DIAGNOSIS — Z9981 Dependence on supplemental oxygen: Secondary | ICD-10-CM | POA: Insufficient documentation

## 2017-11-07 DIAGNOSIS — G4734 Idiopathic sleep related nonobstructive alveolar hypoventilation: Secondary | ICD-10-CM | POA: Diagnosis not present

## 2017-11-07 NOTE — Progress Notes (Signed)
SLEEP MEDICINE CLINIC   Provider:  Larey Seat, M D  Referring Provider: Reynold Bowen, MD Primary Care Physician:  Reynold Bowen, MD  Chief Complaint  Patient presents with  . Follow-up    pt and spouse , rm 70. pt's wife states he has intermittent choking with swallowing.      HPI: 11-07-2017, [name] is here seen with his wife, and has petechiae.  He is also evaluated for memory loss and scored very nicely 26 out of 30 points today on the Mini-Mental Status Examination by Lillia Corporal.  He sleeps in the night well on 2 liters of oxygen- Fatigue is endorsed at 50/ 63 points, Epworth sleepiness score of 7/24 -year he also has excellently adjusted to the walker, he is very hard of hearing, he has kidney stones that give him pain- and the medication makes his drowsy. He has swelling in the right flank. I urged him to go to Dr Jeffie Pollock.I  think his medical problems, pain and medication- all have a role in fatigue.     06-26-2017, [name] is a 72 y.o., married, caucasian , right handed  male , who is seen here in a RV following his last PSG on Oxygen - from  05-01-2017. I have the pleasure of seeing him today in a revisit on 26 June 2017 in the presence of his wife.  [name] was seen in the ED at [facility] and dx with a UTI- but by the time he had undergone MRI and CT brain for confusion.   All negative.  [name] see below underwent a sleep study on 01 May 2017 which was meant to be a split-night polysomnography.  He has significant co-morbidities which I have listed at the history section.  Cardiac ejection fraction, CABG, hypertension, he could not tolerate CPAP 25 years ago and uses oxygen at home only.  The sleep study showed 0.0 apneas, it also showed no oxygen desaturation while on the current level of nocturnal oxygen.  Below average sleep efficiency was noted but no physiologic reasons for arousals correlated to the measured channels.  Sleep efficiency still was 75% which meant  that he slept for about 305 minutes at night , just over 5 hours.  He had some irregular beats PACs and PVCs but no evidence of atrial fibrillation. He remained all night on 2 liters of 02. HE doesn't need PAP !  We discuss sleep hygiene today, which affects both spouses. Both have some sleep issues.    I have the pleasure of seeing [name] here today on 24 February 2017 in a revisit.  The patient was diagnosed in August 2018 with a stroke and acute peripheral ischemic focus in the posterior right temporal lobe.  He also had microvascular changes.  Moderate cortical atrophy was noted.  Compensatory ventricular enlargement was seen.MRA showed left internal carotid artery normal flow both vertebral arteries were with antegrade flow left common carotid artery and bifurcation were patent, all appear to be patent= performed on 06 December 2016. Cognitive evaluation MMSE 28/30 points. He should change to [facility] next time. He walk with a walker. The patient is still on the same medication -  Aggrenox,  The patient indicated he wanted to try a CPAP, instead of just Oxygen at 2 liters . He needs another study- SPLIT study.   Interval history from 11/21/2016. [name] returns today after his MRI from 11/16/2016 documented a fresh, acute right sided brain stroke. In addition the interpretation  by Dr. Ladonna Snide suggested the presence of multiple lacunar infarcts throughout the left brain hemisphere. Main risk factor for these blood colons his hypertension, the main risk factor for his right-sided brain stroke is an vasogenic embolism. The still need to check out that there is no blood clot sitting in his heart, we will order an power Doppler study, if this is negative I would suggest a cardiac monitor to rule out A. fib and I suggest to change him for now on Plavix and aspirin to Aggrenox. Patients on Aggrenox which is take it twice a day, often develop headaches, I usually premedicate with Tylenol. He still resides  in [facility], and he comes back after 2.5 years . He wears hearing aids, and uses a seated walker. He was followed in the sleep clinic 2015 , but needs now  a memory evaluation , he felt. In the meantime he has developed kidney stones, urinary dribbling, sudden gait disturbance and his wife felt there was a sudden decline- in late April 2018 He continues to use oxygen instead of using CPAP - which he could not tolerate.  His brother in law, an internist, suspected NPH could be present. He has been seen at [facility] in [place], [place] and had back MRIs there.    06-2013 original referral  from Dr. Einar Gip and [name] for a sleep evaluation,[name] was referred after developing angina pectoris with known coronary artery disease, hearing loss and status post CABG . He had a coronary angiogram on 01-01-13 documenting kinesis of the posterior basal segment of the heart with an EF of 40%, mild mitral regurgitation.Right dominant circulation or carotid proximal circumflex and TG toward ICA occluded as well. Diffuse disease in other parts.  The saphenous vein graft from 1996 when he underwent CABG shows some diffuse disease as well. The patient continues to take all antiplatelet therapy as an HbA1c of 5.3,  a total cholesterol of 156.  Additional risk factor management we'll address the possibility of the patient having obstructive sleep apnea.  [name] had ordered a nocturnal pulse oximetry,  which documented 16 minutes at or below 88% saturation , and 31 minutes of less than 89% saturation - desaturation events were 10/hr.  The patient was started by [name]. on oxygen supplementation at 2-1/2 L per minute. The patient states that he feels the best all day close to his bed-time, in return delays going to bed frequently.  Bedtime is  2 AM and he will need 1 to 60 minutes to go to sleep. He takes 2 xanax every night,  Has done for decades.  He used to nap well in his recliner, but now wakes up with angina.  He is concerned about his angina when asleep in supine. He goes to sleep on his side, or prone, but wakes up on his back.  He has chronic nasal congestion, he has undergone a septal deviation repair but remains restricted in nasal airflow. He has a mustache. He stopped smoking October 20 1983, after smoking since 1962. He rarely drinks ETOH.  He believes he had never had a stroke, but underwent a Carotid End arterectomy  on the left with a " TIA" , January 2015.  The patient lives In [place] , [place].   01-15-14 Interval history :  [name] returns after his sleep study , which he found enjoyable. The sleep study took place on 01-01-14 the patient had endorsed CPAP for a score of at 6 points and  the fatigue score at 48 points. He is also endorsed the pH Q8 14 points which indicates depression. His AHI was 7.5 which is truly a mild degree his RDI 8.4 in rem sleep his apnea index increased is 33 the pylorus sleep. His lowest oxygen saturation was 82% and is a total time of 15.4 minutes. His heart rate was irregular. I suggested that there could be two treatment options.  #1  REM sleep suppressant medication that would decrease apnea since it was REM dependent and off on the CPAP treatment as longer necessary.  Patient chose cymbalta .   #2 if the patient is loudly snoring and feels that his respiratory disturbance he is high CPAP should be attempted at a lower pressure. REM dependent apnea, patient would like to treat with REM supressent medication. In this case he started Cymbalta.  CO2 retention. Patient is on Oxygen at home, continue O2 supplemented at 2.5 litres. Dr Einar Gip prescriber . He continues cardio rehab, Interval history to-10-16, the patient reports that the Cymbalta has helped him to sleep deeper and sounder. Cymbalta is used as a REM sleep suppressant in his case as he had REM dependent sleep apnea. His wife added that he is also less panicky less anxious. He noticed less anxiety. He still  sleeps sounder and falls asleep quicker than he used to. Sleep latency is now less than an hour and maybe 30 minutes. He will have one nocturia.He does not have any headaches in the morning.    Review of Systems: Out of a complete 14 system review, the patient complains of only the following symptoms, and all other reviewed systems are negative. Sleepiness , fatigue, hearing loss, dysphonia of voice.  Dysphagia - Gait  disorder- thought to be orthopedic.  forgetfulnes- now nocturia.  Epworth score 7 , Fatigue severity score 48 , depression score 5/ 15    Social History   Socioeconomic History  . Marital status: Married    Spouse name: [name]  . Number of children: 0  . Years of education: Grad. scho  . Highest education level: Not on file  Occupational History  . Not on file  Social Needs  . Financial resource strain: Not on file  . Food insecurity:    Worry: Not on file    Inability: Not on file  . Transportation needs:    Medical: Not on file    Non-medical: Not on file  Tobacco Use  . Smoking status: Former Smoker    Last attempt to quit: 10/20/1983    Years since quitting: 34.0  . Smokeless tobacco: Never Used  Substance and Sexual Activity  . Alcohol use: Yes    Comment: occas.  . Drug use: No  . Sexual activity: Not on file  Lifestyle  . Physical activity:    Days per week: Not on file    Minutes per session: Not on file  . Stress: Not on file  Relationships  . Social connections:    Talks on phone: Not on file    Gets together: Not on file    Attends religious service: Not on file    Active member of club or organization: Not on file    Attends meetings of clubs or organizations: Not on file    Relationship status: Not on file  . Intimate partner violence:    Fear of current or ex partner: Not on file    Emotionally abused: Not on file    Physically abused: Not on file  Forced sexual activity: Not on file  Other Topics Concern  . 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   Patient is retired.   Patient has a Sports coach.   Patient is right-handed.   Patient drinks 3-4 cups of coffee and three cups of soda/tea per day.    Past Medical History:  Diagnosis Date  . CAD (coronary artery disease) of artery bypass graft 12/09/2013  . Coronary artery disease   . Diabetes mellitus   . Hearing loss    wears bilateral hearing aids  . History of nephrolithiasis   . Hypertension   . Kidney stone   . MI (myocardial infarction) ([place]) 1985   (several)  . Stroke [facility])     Past Surgical History:  Procedure Laterality Date  . AV FISTULA REPAIR  2005  . broken wrist surgery Left 2001  . CAROTID ENDARTERECTOMY    . CORONARY ARTERY BYPASS GRAFT  1996   5 vessels  . KIDNEY STONE SURGERY    . WRIST SURGERY      Current Outpatient Medications  Medication Sig Dispense Refill  . ALPRAZolam (XANAX) 0.5 MG tablet Take 1 tablet (0.5 mg total) at bedtime as needed by mouth. (Patient taking differently: Take 0.5 mg by mouth See admin instructions. 0.5 mg two times a day as needed for anxiety and 0.5 mg at bedtime SCHEDULED) 45 tablet 0  . aspirin EC 81 MG tablet Take 81 mg by mouth at bedtime.    Marland Kitchen atorvastatin (LIPITOR) 20 MG tablet Take 20 mg by mouth at bedtime.    . carvedilol (COREG) 25 MG tablet Take 25 mg by mouth 2 (two) times daily with a meal.    . clopidogrel (PLAVIX) 75 MG tablet Take 75 mg by mouth every morning.     . DULoxetine (CYMBALTA) 60 MG capsule Take 60 mg by mouth at bedtime.    . fenofibrate 160 MG tablet Take 160 mg by mouth daily.    . isosorbide mononitrate (IMDUR) 30 MG 24 hr tablet Take 60 mg by mouth 2 (two) times daily.    Marland Kitchen LINZESS 145 MCG CAPS capsule Take 145 mcg daily by mouth.  12  . losartan (COZAAR) 25 MG tablet Take 0.5 tablets (12.5 mg total) by mouth daily.    . Multiple Vitamins-Minerals (MULTIVITAMIN PO) Take 1 tablet by  mouth daily.    . nitroGLYCERIN (NITROSTAT) 0.4 MG SL tablet Place 1 tablet (0.4 mg total) under the tongue every 5 (five) minutes as needed for chest pain. 25 tablet 2  . ranolazine (RANEXA) 1000 MG SR tablet Take 1,000 mg by mouth 2 (two) times daily.     . magnesium oxide (MAG-OX) 400 MG tablet Take 400 mg by mouth daily.     No current facility-administered medications for this visit.     Allergies as of 11/07/2017 - Review Complete 11/07/2017  Allergen Reaction Noted  . Penicillins Other (See Comments) 03/14/2011    Vitals: BP 121/65   Pulse 67   Ht '5\' 9"'$  (1.753 m)   Wt 218 lb (98.9 kg)   BMI 32.19 kg/m  Last Weight:  Wt Readings from Last 1 Encounters:  11/07/17 218 lb (98.9 kg)       Last Height:   Ht Readings from Last 1 Encounters:  11/07/17 '5\' 9"'$  (1.753 m)    Physical exam:  General: The patient is awake, alert and  appears not in acute distress.  Head: Normocephalic, atraumatic. Neck is supple. Mallampati 3 with a short airway, elongate uvula and tonsils, his uvula deviated to the left.   neck circumference: 17 ". Nasal airflow restricted , TMJ is not  evident . Retrognathia is not seen.  Cardiovascular:  Regular rate and rhyth, without  murmurs or carotid bruit, and without distended neck veins. Respiratory: Lungs are clear to auscultation. Skin:  Without evidence of edema, or rash Trunk: BMI is elevated and patient  has normal posture.  Neurologic exam : The patient is awake and alert, oriented to place and time.   Memory subjective  described as intact. There is a normal attention span & concentration ability. Speech is fluent with dysphonia . Mood and affect are appropriate. MMSE - Mini Mental State Exam 11/07/2017 02/24/2017 10/24/2016  Orientation to time '3 4 4  '$ Orientation to time comments - - stated wrong date. 15th instead of 16th  Orientation to Place '4 5 5  '$ Registration '3 3 3  '$ Attention/ Calculation '4 5 5  '$ Recall '3 3 3  '$ Language- name 2 objects '2 2 2   '$ Language- repeat '1 1 1  '$ Language- follow 3 step command '3 2 3  '$ Language- read & follow direction '1 1 1  '$ Write a sentence '1 1 1  '$ Copy design '1 1 1  '$ Total score '26 28 29   '$ 27/30 - missed 2 out of 3 recall words. Will change to [facility] next visit.  High fatigue on oxygen !!  Cranial nerves:Pupils are equal and briskly reactive to light. Visual fields by finger perimetry are intact. Hearing to finger rub intact.  Facial sensation intact to fine touch.  Facial motor strength : has a droop of the right face and equal tongue  movement. Equal shoulder shrug  Motor exam:  Elevated tone on the right arm, pronator drift and dysmetria. - this was present in 2015  Gait and station: Patient walks with a walker- that was not the case when last seen.   Deep tendon reflexes: in the  upper and lower extremities are symmetric and intact. Babinski maneuver response is downgoing.   Assessment:  After physical and neurologic examination, review of laboratory studies, imaging, neurophysiology testing and pre-existing records, assessment is   1) new stroke - right - accounts  for the gait instability, confusion/ memory loss and his urinary problems. His stroke could be off arousal embolic origin, he has an echocardiogram scheduled with Dr. Willaim Bane and 14 days, I would love to have an additional carotid ultrasound study, and to discuss change to Aggrenox from the current medication regimen of Plavix and aspirin.  2) No CPAP needed. He stays on oxygen.   3) He likely had a previous stroke and not a TIA in January 2015. MRI confirmed this- he has lacunes throughout the left brain.  New stroke risk based on EF 40 % , CHF due to CAD , dyskinetic Heart.  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   Plan:  Treatment plan and additional workup :   1) Stroke prevention-  Dr Einar Gip met with him on 03-06-2017 and reduced HTN medication. He presented here on 06-26-2017, very  hypotensive today, had just been at Dr Baldwin [place] office at 10 AM and was 443 mmHg systolic there, but  89 mmHg here ! 2) He looks fine and is not fainty. He stays on oxygen.  Possible early vascular dementia- will  follow with q 6 month [facility]. He needs sleep hygiene instruction.    3) continue oxygen - no CPAP need.   RV 8 month with Np or me   Larey Seat MD   ABPN, ABSM and Fellow of the AASM and AAN.   11/07/2017

## 2017-11-08 ENCOUNTER — Inpatient Hospital Stay (HOSPITAL_COMMUNITY)
Admission: EM | Admit: 2017-11-08 | Discharge: 2017-11-12 | DRG: 394 | Disposition: A | Discharge status: Home / Self Care | Payer: Medicare Other | Source: Ambulatory Visit | Attending: Internal Medicine | Admitting: Internal Medicine

## 2017-11-08 ENCOUNTER — Encounter (HOSPITAL_COMMUNITY): Payer: Self-pay | Admitting: Emergency Medicine

## 2017-11-08 ENCOUNTER — Other Ambulatory Visit: Payer: Self-pay

## 2017-11-08 DIAGNOSIS — K589 Irritable bowel syndrome without diarrhea: Secondary | ICD-10-CM

## 2017-11-08 DIAGNOSIS — G3184 Mild cognitive impairment, so stated: Secondary | ICD-10-CM | POA: Diagnosis present

## 2017-11-08 DIAGNOSIS — G4710 Hypersomnia, unspecified: Secondary | ICD-10-CM | POA: Diagnosis present

## 2017-11-08 DIAGNOSIS — E1122 Type 2 diabetes mellitus with diabetic chronic kidney disease: Secondary | ICD-10-CM | POA: Diagnosis present

## 2017-11-08 DIAGNOSIS — Z820 Family history of epilepsy and other diseases of the nervous system: Secondary | ICD-10-CM

## 2017-11-08 DIAGNOSIS — Z8249 Family history of ischemic heart disease and other diseases of the circulatory system: Secondary | ICD-10-CM

## 2017-11-08 DIAGNOSIS — Z87891 Personal history of nicotine dependence: Secondary | ICD-10-CM

## 2017-11-08 DIAGNOSIS — N183 Chronic kidney disease, stage 3 unspecified: Secondary | ICD-10-CM | POA: Diagnosis present

## 2017-11-08 DIAGNOSIS — Z7902 Long term (current) use of antithrombotics/antiplatelets: Secondary | ICD-10-CM | POA: Diagnosis not present

## 2017-11-08 DIAGNOSIS — I252 Old myocardial infarction: Secondary | ICD-10-CM

## 2017-11-08 DIAGNOSIS — I5032 Chronic diastolic (congestive) heart failure: Secondary | ICD-10-CM | POA: Diagnosis present

## 2017-11-08 DIAGNOSIS — I130 Hypertensive heart and chronic kidney disease with heart failure and stage 1 through stage 4 chronic kidney disease, or unspecified chronic kidney disease: Secondary | ICD-10-CM | POA: Diagnosis present

## 2017-11-08 DIAGNOSIS — I25701 Atherosclerosis of coronary artery bypass graft(s), unspecified, with angina pectoris with documented spasm: Secondary | ICD-10-CM | POA: Diagnosis not present

## 2017-11-08 DIAGNOSIS — D649 Anemia, unspecified: Secondary | ICD-10-CM | POA: Diagnosis present

## 2017-11-08 DIAGNOSIS — E785 Hyperlipidemia, unspecified: Secondary | ICD-10-CM | POA: Diagnosis present

## 2017-11-08 DIAGNOSIS — Z7982 Long term (current) use of aspirin: Secondary | ICD-10-CM | POA: Diagnosis not present

## 2017-11-08 DIAGNOSIS — Z8673 Personal history of transient ischemic attack (TIA), and cerebral infarction without residual deficits: Secondary | ICD-10-CM

## 2017-11-08 DIAGNOSIS — I25810 Atherosclerosis of coronary artery bypass graft(s) without angina pectoris: Secondary | ICD-10-CM | POA: Diagnosis present

## 2017-11-08 DIAGNOSIS — Z9981 Dependence on supplemental oxygen: Secondary | ICD-10-CM | POA: Diagnosis not present

## 2017-11-08 DIAGNOSIS — I10 Essential (primary) hypertension: Secondary | ICD-10-CM | POA: Diagnosis not present

## 2017-11-08 DIAGNOSIS — G4730 Sleep apnea, unspecified: Secondary | ICD-10-CM | POA: Diagnosis present

## 2017-11-08 DIAGNOSIS — Z87442 Personal history of urinary calculi: Secondary | ICD-10-CM

## 2017-11-08 DIAGNOSIS — K3580 Unspecified acute appendicitis: Principal | ICD-10-CM | POA: Diagnosis present

## 2017-11-08 HISTORY — DX: Unspecified dementia, unspecified severity, without behavioral disturbance, psychotic disturbance, mood disturbance, and anxiety: F03.90

## 2017-11-08 HISTORY — DX: Low back pain, unspecified: M54.50

## 2017-11-08 HISTORY — DX: Bilateral primary osteoarthritis of knee: M17.0

## 2017-11-08 HISTORY — DX: Depression, unspecified: F32.A

## 2017-11-08 HISTORY — DX: Low back pain: M54.5

## 2017-11-08 HISTORY — DX: Other chronic pain: G89.29

## 2017-11-08 HISTORY — DX: Sleep apnea, unspecified: G47.30

## 2017-11-08 HISTORY — DX: Pure hypercholesterolemia, unspecified: E78.00

## 2017-11-08 HISTORY — DX: Anxiety disorder, unspecified: F41.9

## 2017-11-08 HISTORY — DX: Personal history of urinary calculi: Z87.442

## 2017-11-08 HISTORY — DX: Major depressive disorder, single episode, unspecified: F32.9

## 2017-11-08 HISTORY — DX: Dependence on supplemental oxygen: Z99.81

## 2017-11-08 LAB — COMPREHENSIVE METABOLIC PANEL
ALT: 14 U/L (ref 0–44)
AST: 21 U/L (ref 15–41)
Albumin: 3.4 g/dL — ABNORMAL LOW (ref 3.5–5.0)
Alkaline Phosphatase: 35 U/L — ABNORMAL LOW (ref 38–126)
Anion gap: 8 (ref 5–15)
BUN: 25 mg/dL — ABNORMAL HIGH (ref 8–23)
CO2: 26 mmol/L (ref 22–32)
Calcium: 9.3 mg/dL (ref 8.9–10.3)
Chloride: 104 mmol/L (ref 98–111)
Creatinine, Ser: 1.47 mg/dL — ABNORMAL HIGH (ref 0.61–1.24)
GFR calc Af Amer: 52 mL/min — ABNORMAL LOW (ref >60)
GFR calc non Af Amer: 45 mL/min — ABNORMAL LOW (ref >60)
Glucose, Bld: 101 mg/dL — ABNORMAL HIGH (ref 70–99)
Potassium: 4.2 mmol/L (ref 3.5–5.1)
Sodium: 138 mmol/L (ref 135–145)
Total Bilirubin: 0.7 mg/dL (ref 0.3–1.2)
Total Protein: 6.9 g/dL (ref 6.5–8.1)

## 2017-11-08 LAB — TYPE AND SCREEN
ABO/RH(D): A POS
Antibody Screen: NEGATIVE

## 2017-11-08 LAB — URINALYSIS, ROUTINE W REFLEX MICROSCOPIC
Bilirubin Urine: NEGATIVE
Glucose, UA: NEGATIVE mg/dL
Hgb urine dipstick: NEGATIVE
Ketones, ur: NEGATIVE mg/dL
Leukocytes, UA: NEGATIVE
Nitrite: NEGATIVE
Protein, ur: NEGATIVE mg/dL
Specific Gravity, Urine: 1.019 (ref 1.005–1.030)
pH: 5 (ref 5.0–8.0)

## 2017-11-08 LAB — CBC
HCT: 37.8 % — ABNORMAL LOW (ref 39.0–52.0)
Hemoglobin: 12.4 g/dL — ABNORMAL LOW (ref 13.0–17.0)
MCH: 31 pg (ref 26.0–34.0)
MCHC: 32.8 g/dL (ref 30.0–36.0)
MCV: 94.5 fL (ref 78.0–100.0)
Platelets: 251 10*3/uL (ref 150–400)
RBC: 4 MIL/uL — ABNORMAL LOW (ref 4.22–5.81)
RDW: 13.4 % (ref 11.5–15.5)
WBC: 12.4 10*3/uL — ABNORMAL HIGH (ref 4.0–10.5)

## 2017-11-08 LAB — LIPASE, BLOOD: Lipase: 28 U/L (ref 11–51)

## 2017-11-08 LAB — ABO/RH: ABO/RH(D): A POS

## 2017-11-08 MED ORDER — ALPRAZOLAM 0.5 MG PO TABS: 0.5000 mg | ORAL_TABLET | Freq: Two times a day (BID) | ORAL | Status: DC | PRN

## 2017-11-08 MED ORDER — DULOXETINE HCL 60 MG PO CPEP
60.0000 mg | ORAL_CAPSULE | Freq: Every day | ORAL | Status: DC
  Administered 2017-11-08 – 2017-11-11 (×4): 60 mg via ORAL
  Filled 2017-11-08 (×4): qty 1

## 2017-11-08 MED ORDER — ISOSORBIDE MONONITRATE ER 60 MG PO TB24
60.0000 mg | ORAL_TABLET | Freq: Two times a day (BID) | ORAL | Status: DC
  Administered 2017-11-08 – 2017-11-12 (×8): 60 mg via ORAL
  Filled 2017-11-08 (×9): qty 1

## 2017-11-08 MED ORDER — RANOLAZINE ER 500 MG PO TB12
1000.0000 mg | ORAL_TABLET | Freq: Two times a day (BID) | ORAL | Status: DC
  Administered 2017-11-09 – 2017-11-12 (×7): 1000 mg via ORAL
  Filled 2017-11-08 (×7): qty 2

## 2017-11-08 MED ORDER — ACETAMINOPHEN 325 MG PO TABS: 650.0000 mg | ORAL_TABLET | Freq: Four times a day (QID) | ORAL | Status: DC | PRN

## 2017-11-08 MED ORDER — MORPHINE SULFATE (PF) 4 MG/ML IV SOLN: 1.0000 mg | INTRAVENOUS | Status: DC | PRN

## 2017-11-08 MED ORDER — PIPERACILLIN-TAZOBACTAM 3.375 G IVPB
3.3750 g | Freq: Three times a day (TID) | INTRAVENOUS | Status: DC
  Administered 2017-11-09 – 2017-11-12 (×10): 3.375 g via INTRAVENOUS
  Filled 2017-11-08 (×8): qty 50

## 2017-11-08 MED ORDER — PIPERACILLIN-TAZOBACTAM 3.375 G IVPB 30 MIN
3.3750 g | Freq: Once | INTRAVENOUS | Status: AC
  Administered 2017-11-08: 3.375 g via INTRAVENOUS
  Filled 2017-11-08: qty 50

## 2017-11-08 MED ORDER — ONDANSETRON HCL 4 MG PO TABS: 4.0000 mg | ORAL_TABLET | Freq: Four times a day (QID) | ORAL | Status: DC | PRN

## 2017-11-08 MED ORDER — CARVEDILOL 25 MG PO TABS
25.0000 mg | ORAL_TABLET | Freq: Two times a day (BID) | ORAL | Status: DC
  Administered 2017-11-08 – 2017-11-12 (×8): 25 mg via ORAL
  Filled 2017-11-08 (×8): qty 1

## 2017-11-08 MED ORDER — ATORVASTATIN CALCIUM 20 MG PO TABS
20.0000 mg | ORAL_TABLET | Freq: Every day | ORAL | Status: DC
  Administered 2017-11-08 – 2017-11-11 (×4): 20 mg via ORAL
  Filled 2017-11-08 (×4): qty 1

## 2017-11-08 MED ORDER — ONDANSETRON HCL 4 MG/2ML IJ SOLN: 4.0000 mg | Freq: Four times a day (QID) | INTRAMUSCULAR | Status: DC | PRN

## 2017-11-08 MED ORDER — ACETAMINOPHEN 650 MG RE SUPP: 650.0000 mg | Freq: Four times a day (QID) | RECTAL | Status: DC | PRN

## 2017-11-08 MED ORDER — SODIUM CHLORIDE 0.9 % IV SOLN
INTRAVENOUS | Status: AC
  Administered 2017-11-08: 21:00:00 via INTRAVENOUS

## 2017-11-08 NOTE — ED Provider Notes (Signed)
[facility] EMERGENCY DEPARTMENT Provider Note   CSN: [id number] Arrival date & time: 11/08/17  1623   History   Chief Complaint Chief Complaint  Patient presents with  . Abdominal Pain    RLQ    HPI [name] is a 72 y.o. male.  HPI    72 year old male presents today with complaints of abdominal pain and appendicitis.  Patient notes 4-day history of worsening right lower quadrant abdominal pain.  He denies any urinary or bowel changes, notes some nausea denies any vomiting, denies fever.  Patient notes history of kidney surgery, no other abdominal surgeries.  Patient notes his last p.o. intake was 1 ounce of Diet Coke around 2:00 today, last solid intake at 9 AM this morning.  Patient notes he does take Plavix and aspirin, he notes taking Plavix today.   Past Medical History:  Diagnosis Date  . CAD (coronary artery disease) of artery bypass graft 12/09/2013  . Coronary artery disease   . Diabetes mellitus   . Hearing loss    wears bilateral hearing aids  . History of nephrolithiasis   . Hypertension   . Kidney stone   . MI (myocardial infarction) (HCC) 1985   (several)  . Stroke [facility])     Patient Active Problem List   Diagnosis Date Noted  . Acute appendicitis 11/08/2017  . Supplemental oxygen dependent 11/07/2017  . MCI (mild cognitive impairment) with memory loss 11/07/2017  . Sleep-related hypoxia 06/26/2017  . Chronic diastolic CHF (congestive heart failure) (HCC) 06/26/2017  . Stroke-like symptoms 03/30/2017  . CKD stage 3 due to type 2 diabetes mellitus (HCC)   . Benign essential HTN   . Coronary artery disease involving native coronary artery of native heart without angina pectoris   . TIA (transient ischemic attack) 03/29/2017  . 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 History of completed stroke 10/24/2016  . Coronary artery disease involving coronary bypass graft with unspecified angina pectoris  05/21/2014  . Sleep related hypoventilation/hypoxemia in other disease 01/15/2014  . Primary snoring 12/09/2013  . Hypersomnia with sleep apnea, unspecified 12/09/2013  . CAD (coronary artery disease) of artery bypass graft 12/09/2013    Past Surgical History:  Procedure Laterality Date  . AV FISTULA REPAIR  2005  . broken wrist surgery Left 2001  . CAROTID ENDARTERECTOMY    . CORONARY ARTERY BYPASS GRAFT  1996   5 vessels  . KIDNEY STONE SURGERY    . WRIST SURGERY          Home Medications    Prior to Admission medications   Medication Sig Start Date End Date Taking? Authorizing Provider  ALPRAZolam Prudy Feeler) 0.5 MG tablet Take 1 tablet (0.5 mg total) at bedtime as needed by mouth. Patient taking differently: Take 0.5 mg by mouth See admin instructions. 0.5 mg two times a day as needed for anxiety and 0.5 mg at bedtime SCHEDULED 02/24/17   Dohmeier, Porfirio Mylar, MD  aspirin EC 81 MG tablet Take 81 mg by mouth at bedtime.    [provider]  atorvastatin (LIPITOR) 20 MG tablet Take 20 mg by mouth at bedtime.    [provider]  carvedilol (COREG) 25 MG tablet Take 25 mg by mouth 2 (two) times daily with a meal.    [provider]  clopidogrel (PLAVIX) 75 MG tablet Take 75 mg by mouth every morning.     [provider]  DULoxetine (CYMBALTA) 60 MG capsule Take 60  mg by mouth at bedtime.    [provider]  fenofibrate 160 MG tablet Take 160 mg by mouth daily.    [provider]  isosorbide mononitrate (IMDUR) 30 MG 24 hr tablet Take 60 mg by mouth 2 (two) times daily.    [provider]  LINZESS 145 MCG CAPS capsule Take 145 mcg daily by mouth. 01/09/17   [provider]  losartan (COZAAR) 25 MG tablet Take 0.5 tablets (12.5 mg total) by mouth daily. 04/01/17   Vassie LollMadera, Carlos, MD  magnesium oxide (MAG-OX) 400 MG tablet Take 400 mg by mouth daily.    [provider]  Multiple Vitamins-Minerals (MULTIVITAMIN  PO) Take 1 tablet by mouth daily.    [provider]  nitroGLYCERIN (NITROSTAT) 0.4 MG SL tablet Place 1 tablet (0.4 mg total) under the tongue every 5 (five) minutes as needed for chest pain. 06/26/13   Runell GessBerry, Jonathan J, MD  ranolazine (RANEXA) 1000 MG SR tablet Take 1,000 mg by mouth 2 (two) times daily.     [provider]    Family History Family History  Problem Relation Age of Onset  . Heart disease Mother   . Liver cancer Unknown   . Parkinson's disease Unknown   . Heart disease Brother   . Parkinsonism Sister   . Cancer Sister     Social History Social History   Tobacco Use  . Smoking status: Former Smoker    Last attempt to quit: 10/20/1983    Years since quitting: 34.0  . Smokeless tobacco: Never Used  Substance Use Topics  . Alcohol use: Yes    Comment: occas.  . Drug use: No     Allergies   Penicillins   Review of Systems Review of Systems  All other systems reviewed and are negative.   Physical Exam Updated Vital Signs BP (!) 103/57 (BP Location: Left Arm)   Pulse 70   Temp 98.1 F (36.7 C) (Oral)   Resp 16   Ht 5\' 9"  (1.753 m)   Wt 99.1 kg (218 lb 8 oz)   SpO2 98%   BMI 32.27 kg/m   Physical Exam  Constitutional: He is oriented to person, place, and time. He appears well-developed and well-nourished.  HENT:  Head: Normocephalic and atraumatic.  Eyes: Pupils are equal, round, and reactive to light. Conjunctivae are normal. Right eye exhibits no discharge. Left eye exhibits no discharge. No scleral icterus.  Neck: Normal range of motion. No JVD present. No tracheal deviation present.  Pulmonary/Chest: Effort normal. No stridor.  Abdominal:  RLQ TTP- remainder  of abdomen soft NTTP  Neurological: He is alert and oriented to person, place, and time. Coordination normal.  Psychiatric: He has a normal mood and affect. His behavior is normal. Judgment and thought content normal.  Nursing note and vitals reviewed.    ED  Treatments / Results  Labs (all labs ordered are listed, but only abnormal results are displayed) Labs Reviewed  COMPREHENSIVE METABOLIC PANEL - Abnormal; Notable for the following components:      Result Value   Glucose, Bld 101 (*)    BUN 25 (*)    Creatinine, Ser 1.47 (*)    Albumin 3.4 (*)    Alkaline Phosphatase 35 (*)    GFR calc non Af Amer 45 (*)    GFR calc Af Amer 52 (*)    All other components within normal limits  CBC - Abnormal; Notable for the following components:   WBC 12.4 (*)  RBC 4.00 (*)    Hemoglobin 12.4 (*)    HCT 37.8 (*)    All other components within normal limits  LIPASE, BLOOD  URINALYSIS, ROUTINE W REFLEX MICROSCOPIC  BASIC METABOLIC PANEL  CBC WITH DIFFERENTIAL/PLATELET  TYPE AND SCREEN    EKG None  Radiology No results found.  Procedures Procedures (including critical care time)  Medications Ordered in ED Medications  piperacillin-tazobactam (ZOSYN) IVPB 3.375 g (has no administration in time range)  ALPRAZolam (XANAX) tablet 0.5 mg (has no administration in time range)  atorvastatin (LIPITOR) tablet 20 mg (has no administration in time range)  carvedilol (COREG) tablet 25 mg (has no administration in time range)  DULoxetine (CYMBALTA) DR capsule 60 mg (has no administration in time range)  isosorbide mononitrate (IMDUR) 24 hr tablet 60 mg (has no administration in time range)  ranolazine (RANEXA) 12 hr tablet 1,000 mg (has no administration in time range)  0.9 %  sodium chloride infusion (has no administration in time range)  acetaminophen (TYLENOL) tablet 650 mg (has no administration in time range)    Or  acetaminophen (TYLENOL) suppository 650 mg (has no administration in time range)  ondansetron (ZOFRAN) tablet 4 mg (has no administration in time range)    Or  ondansetron (ZOFRAN) injection 4 mg (has no administration in time range)  morphine 4 MG/ML injection 1-3 mg (has no administration in time range)    piperacillin-tazobactam (ZOSYN) IVPB 3.375 g (3.375 g Intravenous New Bag/Given 11/08/17 1856)     Initial Impression / Assessment and Plan / ED Course  I have reviewed the triage vital signs and the nursing notes.  Pertinent labs & imaging results that were available during my care of the patient were reviewed by me and considered in my medical decision making (see chart for details).     Labs: Lipase, CMP lipase, CMP, CBC  Imaging:  Consults: General surgery, hospitalist, pharmacy  Therapeutics: Zosyn  Discharge Meds:   Assessment/Plan: 72 year old male presents today with acute appendicitis.  No comp gating features on imaging study that was performed as an outpatient.  Patient has slight elevation in white count, afebrile.  General surgery consulted, pharmacy consulted placed on Zosyn.  Hospitalist consulted for admission.    Final Clinical Impressions(s) / ED Diagnoses   Final diagnoses:  Acute appendicitis, unspecified acute appendicitis type    ED Discharge Orders    None       Rosalio Loud 11/08/17 Vickki Muff, MD 11/08/17 2123

## 2017-11-08 NOTE — H&P (Signed)
CC: RLQ pain x4d  HPI: [name] is an 72 y.o. male with hx of HTN, CKD III, CAD s/p CABG 67yr ago on Plavix (for CABG and "severe disease in his heart vessels) presented to ED today after being seen by his urologist. He had a CT renal stone study ordered which demonstrated findings consistent with acute uncomplicated appendicitis and he was told to report to ED.  Describes pain as sharp/crampy isolated to RLQ, mild. Denies ever having had this before. Denies f/c/n/v. Only sx has been mild discomfort. He has a hx of kidney stones and thought this was related.  Reports last dose of plavix was this morning  Past Medical History:  Diagnosis Date  . Anxiety   . CAD (coronary artery disease) of artery bypass graft 12/09/2013  . Chronic lower back pain   . Coronary artery disease   . Dementia    "step dementia; from mini strokes in 2018" (11/08/2017)  . Depression   . Diabetes mellitus    "totally diet controlled" (11/08/2017)  . Hearing loss    wears bilateral hearing aids  . High cholesterol   . History of kidney stones   . Hypertension   . [id number]  (several)  . On home oxygen therapy    "2L when he sleeps at night" (11/08/2017)  . Osteoarthritis of both knees    "both knees need replaced" (11/08/2017)  . Sleep apnea    "mild; uses oxygen" (11/08/2017)  . Stroke ([facility] 2018   "several silent ones; a little memory loss; step dementia" (11/08/2017)    Past Surgical History:  Procedure Laterality Date  . 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 CARDIAC CATHETERIZATION     "many"  . CAROTID ENDARTERECTOMY Left   . CORONARY ARTERY BYPASS GRAFT  1996   5 vessels  . FRACTURE SURGERY    . KIDNEY STONE SURGERY  ~ 2014   "laparoscopic, robotic OR"  . WRIST FRACTURE SURGERY Left 2001  . WRIST SURGERY      Family History  Problem Relation Age of Onset  . Heart disease Mother   . Liver  cancer Unknown   . Parkinson's disease Unknown   . Heart disease Brother   . Parkinsonism Sister   . Cancer Sister     Social:  reports that he quit smoking about 34 years ago. His smoking use included cigarettes. He has a 40.00 pack-year smoking history. He has never used smokeless tobacco. He reports that he drank alcohol. He reports that he has current or past drug history. Drug: Marijuana.  Allergies:  Allergies  Allergen Reactions  . Penicillins Other (See Comments)    From childhood: Has patient had a PCN reaction causing immediate rash, facial/tongue/throat swelling, SOB or lightheadedness with hypotension: Unk Has patient had a PCN reaction causing severe rash involving mucus membranes or skin necrosis: Unk Has patient had a PCN reaction that required hospitalization: Unk Has patient had a PCN reaction occurring within the last 10 years: No If all of the above answers are "NO", then may proceed with Cephalosporin use.     Medications: I have reviewed the patient's current medications.  Results for orders placed or performed during the hospital encounter of 11/08/17 (from the past 48 hour(s))  Lipase, blood     Status: None   Collection Time: 11/08/17  5:00 PM  Result Value Ref Range  Lipase 28 11 - 51 U/L    Comment: Performed at [facility], [place] [address], [place], [place] [address]  Comprehensive metabolic panel     Status: Abnormal   Collection Time: 11/08/17  5:00 PM  Result Value Ref Range   Sodium 138 135 - 145 mmol/L   Potassium 4.2 3.5 - 5.1 mmol/L   Chloride 104 98 - 111 mmol/L   CO2 26 22 - 32 mmol/L   Glucose, Bld 101 (H) 70 - 99 mg/dL   BUN 25 (H) 8 - 23 mg/dL   Creatinine, Ser 1.47 (H) 0.61 - 1.24 mg/dL   Calcium 9.3 8.9 - 10.3 mg/dL   Total Protein 6.9 6.5 - 8.1 g/dL   Albumin 3.4 (L) 3.5 - 5.0 g/dL   AST 21 15 - 41 U/L   ALT 14 0 - 44 U/L   Alkaline Phosphatase 35 (L) 38 - 126 U/L   Total Bilirubin 0.7 0.3 - 1.2 mg/dL   GFR calc non Af  Amer 45 (L) >60 mL/min   GFR calc Af Amer 52 (L) >60 mL/min    Comment: (NOTE) The eGFR has been calculated using the CKD EPI equation. This calculation has not been validated in all clinical situations. eGFR's persistently <60 mL/min signify possible Chronic Kidney Disease.    Anion gap 8 5 - 15    Comment: Performed at [facility] [address] [place], [place] [address]  CBC     Status: Abnormal   Collection Time: 11/08/17  5:00 PM  Result Value Ref Range   WBC 12.4 (H) 4.0 - 10.5 K/uL   RBC 4.00 (L) 4.22 - 5.81 MIL/uL   Hemoglobin 12.4 (L) 13.0 - 17.0 g/dL   HCT 37.8 (L) 39.0 - 52.0 %   MCV 94.5 78.0 - 100.0 fL   MCH 31.0 26.0 - 34.0 pg   MCHC 32.8 30.0 - 36.0 g/dL   RDW 13.4 11.5 - 15.5 %   Platelets 251 150 - 400 K/uL    Comment: Performed at [facility], [place] [address] [place], [place] [address]  Urinalysis, Routine w reflex microscopic     Status: Abnormal   Collection Time: 11/08/17  8:52 PM  Result Value Ref Range   Color, Urine AMBER (A) YELLOW    Comment: BIOCHEMICALS MAY BE AFFECTED BY COLOR   APPearance CLEAR CLEAR   Specific Gravity, Urine 1.019 1.005 - 1.030   pH 5.0 5.0 - 8.0   Glucose, UA NEGATIVE NEGATIVE mg/dL   Hgb urine dipstick NEGATIVE NEGATIVE   Bilirubin Urine NEGATIVE NEGATIVE   Ketones, ur NEGATIVE NEGATIVE mg/dL   Protein, ur NEGATIVE NEGATIVE mg/dL   Nitrite NEGATIVE NEGATIVE   Leukocytes, UA NEGATIVE NEGATIVE    Comment: Performed at [facility] [address] [place], [place] [address]  Type and screen [place]     Status: None   Collection Time: 11/08/17  9:00 PM  Result Value Ref Range   ABO/RH(D) A POS    Antibody Screen NEG    Sample Expiration      11/11/2017 Performed at [facility], [place] [address] [place], [place] [address]   ABO/Rh     Status: None   Collection Time: 11/08/17  9:00 PM  Result Value Ref Range   ABO/RH(D)      A POS Performed at [facility] [address] [place], [place] [address]  No results found.  ROS - all of the below systems have been reviewed with the patient and positives are indicated with bold text General: chills, fever or night sweats Eyes: blurry vision or double vision ENT: epistaxis or sore throat Allergy/Immunology: itchy/watery eyes or nasal congestion Hematologic/Lymphatic: bleeding problems, blood clots or swollen lymph nodes Endocrine: temperature intolerance or unexpected weight changes Breast: new or changing breast lumps or nipple discharge Resp: cough, shortness of breath, or wheezing CV: chest pain or dyspnea on exertion GI: as per HPI GU: dysuria, trouble voiding, or hematuria MSK: joint pain or joint stiffness Neuro: TIA or stroke symptoms Derm: pruritus and skin lesion changes Psych: anxiety and depression  PE Blood pressure 128/63, pulse 69, temperature 98.3 F (36.8 C), temperature source Oral, resp. rate 18, height '5\' 9"'$  (1.753 m), weight 99.1 kg (218 lb 8 oz), SpO2 98 %. Constitutional: NAD; conversant; no deformities Eyes: Moist conjunctiva; no lid lag; anicteric; PERRL Neck: Trachea midline; no thyromegaly Lungs: Normal respiratory effort; no tactile fremitus CV: RRR; no palpable thrills; no pitting edema GI: Abd soft, not significantly ttp including in RLQ; no palpable hepatosplenomegaly. No rebound, no guarding. MSK: Normal gait; no clubbing/cyanosis Psychiatric: Appropriate affect; alert and oriented x3 Lymphatic: No palpable cervical or axillary lymphadenopathy  Results for orders placed or performed during the hospital encounter of 11/08/17 (from the past 48 hour(s))  Lipase, blood     Status: None   Collection Time: 11/08/17  5:00 PM  Result Value Ref Range   Lipase 28 11 - 51 U/L    Comment: Performed at [facility], [place] [address], [place], [place] [address]  Comprehensive metabolic panel     Status: Abnormal   Collection Time: 11/08/17  5:00 PM  Result Value  Ref Range   Sodium 138 135 - 145 mmol/L   Potassium 4.2 3.5 - 5.1 mmol/L   Chloride 104 98 - 111 mmol/L   CO2 26 22 - 32 mmol/L   Glucose, Bld 101 (H) 70 - 99 mg/dL   BUN 25 (H) 8 - 23 mg/dL   Creatinine, Ser 1.47 (H) 0.61 - 1.24 mg/dL   Calcium 9.3 8.9 - 10.3 mg/dL   Total Protein 6.9 6.5 - 8.1 g/dL   Albumin 3.4 (L) 3.5 - 5.0 g/dL   AST 21 15 - 41 U/L   ALT 14 0 - 44 U/L   Alkaline Phosphatase 35 (L) 38 - 126 U/L   Total Bilirubin 0.7 0.3 - 1.2 mg/dL   GFR calc non Af Amer 45 (L) >60 mL/min   GFR calc Af Amer 52 (L) >60 mL/min    Comment: (NOTE) The eGFR has been calculated using the CKD EPI equation. This calculation has not been validated in all clinical situations. eGFR's persistently <60 mL/min signify possible Chronic Kidney Disease.    Anion gap 8 5 - 15    Comment: Performed at [facility] [address] [place], [place] [address]  CBC     Status: Abnormal   Collection Time: 11/08/17  5:00 PM  Result Value Ref Range   WBC 12.4 (H) 4.0 - 10.5 K/uL   RBC 4.00 (L) 4.22 - 5.81 MIL/uL   Hemoglobin 12.4 (L) 13.0 - 17.0 g/dL   HCT 37.8 (L) 39.0 - 52.0 %   MCV 94.5 78.0 - 100.0 fL   MCH 31.0 26.0 - 34.0 pg   MCHC 32.8 30.0 - 36.0 g/dL   RDW 13.4 11.5 - 15.5 %  Platelets 251 150 - 400 K/uL    Comment: Performed at [facility], [place] [address], [place], [place] [address]  Urinalysis, Routine w reflex microscopic     Status: Abnormal   Collection Time: 11/08/17  8:52 PM  Result Value Ref Range   Color, Urine AMBER (A) YELLOW    Comment: BIOCHEMICALS MAY BE AFFECTED BY COLOR   APPearance CLEAR CLEAR   Specific Gravity, Urine 1.019 1.005 - 1.030   pH 5.0 5.0 - 8.0   Glucose, UA NEGATIVE NEGATIVE mg/dL   Hgb urine dipstick NEGATIVE NEGATIVE   Bilirubin Urine NEGATIVE NEGATIVE   Ketones, ur NEGATIVE NEGATIVE mg/dL   Protein, ur NEGATIVE NEGATIVE mg/dL   Nitrite NEGATIVE NEGATIVE   Leukocytes, UA NEGATIVE NEGATIVE    Comment: Performed at [facility] [address], [place], [place] [address]  Type and screen [place]     Status: None   Collection Time: 11/08/17  9:00 PM  Result Value Ref Range   ABO/RH(D) A POS    Antibody Screen NEG    Sample Expiration      11/11/2017 Performed at [facility], [place] [address] [place], [place] [address]   ABO/Rh     Status: None   Collection Time: 11/08/17  9:00 PM  Result Value Ref Range   ABO/RH(D)      A POS Performed at [facility] [address] [place], [place] [address]     Imaging: CT A/P accessible via PACS dated 11/08/17 shows markedly thickened appendix with RLQ inflammatory changes. Findings concerning for appendicitis with secondary inflammation of TI and asc colon. No evidence of perforation or abscess.  A/P: [name] is an 72 y.o. male with CAD, DM, HTN, HLD, CKD III, CVA here with acute uncomplicated appendicits  -Medicine has admitted the patient -Would recommend cardiology consultation for evaluation and plan regarding plavix - whether safe to hold, etc -Benign abdominal exam, normal vitals, no adverse features on CT aside from inflammation, can attempt nonoperative mgmt. I discussed options with the patient and his wife and risks of each. Certainly high bleeding risk on plavix. They agree with nonoperative approach at this time and would like to avoid surgery unless life/death scenario  Sharon Mt. Dema Severin, [name] General and Colorectal Surgery [facility]

## 2017-11-08 NOTE — H&P (Addendum)
History and Physical    [name] JXB:[id number] DOB: Dec 14, 1945 DOA: 11/08/2017  PCP: [name], MD   Patient coming from: Home   Chief Complaint: RLQ pain, outpatient imaging concerning for appendicitis   HPI: [name] is a 72 y.o. male with medical history significant for coronary artery disease status post CABG, chronic kidney disease stage III, history of CVA, now presenting to the emergency department with 4 days of right lower quadrant abdominal pain and outpatient imaging concerning for acute appendicitis.  Patient reports that he developed pain in the right lower quadrant of his abdomen approximately 4 days ago.  This is described as waxing and waning, sharp, cramping, localized to the right lower quadrant, and worse with palpation and certain movements.  Patient reports a history of kidney stones and had been attributing the pain to this.  He was evaluated by urology in the clinic for this pain, underwent ultrasound that reportedly was concerning for acute appendicitis.  He was directed to the ED for further evaluation of this.  He denies any chest pain, shortness of breath, fevers, or chills.  Denies vomiting or diarrhea.  ED Course: Upon arrival to the ED, patient is found to be afebrile, saturating well on room air, and with vitals otherwise normal.  Chemistry panel is notable for creatinine 1.47, similar to priors.  CBC features a leukocytosis to 12,400 and a mild normocytic anemia.  General surgery was consulted by the ED physician and recommended medical admission.  Patient was started on Zosyn.  He remains hemodynamically stable, in no apparent respiratory distress, and will be admitted to the medical-surgical unit for ongoing evaluation and management.  Review of Systems:  All other systems reviewed and apart from HPI, are negative.  Past Medical History:  Diagnosis Date  . CAD (coronary artery disease) of artery bypass graft 12/09/2013  . Coronary artery disease     . Diabetes mellitus   . Hearing loss    wears bilateral hearing aids  . History of nephrolithiasis   . Hypertension   . Kidney stone   . MI (myocardial infarction) (HCC) 1985   (several)  . Stroke [facility])     Past Surgical History:  Procedure Laterality Date  . AV FISTULA REPAIR  2005  . broken wrist surgery Left 2001  . CAROTID ENDARTERECTOMY    . CORONARY ARTERY BYPASS GRAFT  1996   5 vessels  . KIDNEY STONE SURGERY    . WRIST SURGERY       reports that he quit smoking about 34 years ago. He has never used smokeless tobacco. He reports that he drinks alcohol. He reports that he does not use drugs.  Allergies  Allergen Reactions  . Penicillins Other (See Comments)    From childhood: Has patient had a PCN reaction causing immediate rash, facial/tongue/throat swelling, SOB or lightheadedness with hypotension: Unk Has patient had a PCN reaction causing severe rash involving mucus membranes or skin necrosis: Unk Has patient had a PCN reaction that required hospitalization: Unk Has patient had a PCN reaction occurring within the last 10 years: No If all of the above answers are "NO", then may proceed with Cephalosporin use.     Family History  Problem Relation Age of Onset  . Heart disease Mother   . Liver cancer Unknown   . Parkinson's disease Unknown   . Heart disease Brother   . Parkinsonism Sister   . Cancer Sister      Prior to Admission medications  Medication Sig Start Date End Date Taking? Authorizing Provider  ALPRAZolam Prudy Feeler(XANAX) 0.5 MG tablet Take 1 tablet (0.5 mg total) at bedtime as needed by mouth. Patient taking differently: Take 0.5 mg by mouth See admin instructions. 0.5 mg two times a day as needed for anxiety and 0.5 mg at bedtime SCHEDULED 02/24/17   Dohmeier, Porfirio Mylararmen, MD  aspirin EC 81 MG tablet Take 81 mg by mouth at bedtime.    [provider]  atorvastatin (LIPITOR) 20 MG tablet Take 20 mg by mouth at bedtime.    [provider]   carvedilol (COREG) 25 MG tablet Take 25 mg by mouth 2 (two) times daily with a meal.    [provider]  clopidogrel (PLAVIX) 75 MG tablet Take 75 mg by mouth every morning.     [provider]  DULoxetine (CYMBALTA) 60 MG capsule Take 60 mg by mouth at bedtime.    [provider]  fenofibrate 160 MG tablet Take 160 mg by mouth daily.    [provider]  isosorbide mononitrate (IMDUR) 30 MG 24 hr tablet Take 60 mg by mouth 2 (two) times daily.    [provider]  LINZESS 145 MCG CAPS capsule Take 145 mcg daily by mouth. 01/09/17   [provider]  losartan (COZAAR) 25 MG tablet Take 0.5 tablets (12.5 mg total) by mouth daily. 04/01/17   Vassie LollMadera, Carlos, MD  magnesium oxide (MAG-OX) 400 MG tablet Take 400 mg by mouth daily.    [provider]  Multiple Vitamins-Minerals (MULTIVITAMIN PO) Take 1 tablet by mouth daily.    [provider]  nitroGLYCERIN (NITROSTAT) 0.4 MG SL tablet Place 1 tablet (0.4 mg total) under the tongue every 5 (five) minutes as needed for chest pain. 06/26/13   Runell GessBerry, Jonathan J, MD  ranolazine (RANEXA) 1000 MG SR tablet Take 1,000 mg by mouth 2 (two) times daily.     [provider]    Physical Exam: Vitals:   11/08/17 1639 11/08/17 1642  BP: (!) 103/57   Pulse: 70   Resp: 16   Temp: 98.1 F (36.7 C)   TempSrc: Oral   SpO2: 98%   Weight:  99.1 kg (218 lb 8 oz)  Height:  5\' 9"  (1.753 m)      Constitutional: NAD, calm  Eyes: PERTLA, lids and conjunctivae normal ENMT: Mucous membranes are moist. Posterior pharynx clear of any exudate or lesions.   Neck: normal, supple, no masses, no thyromegaly Respiratory: clear to auscultation bilaterally, no wheezing, no crackles. Normal respiratory effort.    Cardiovascular: S1 & S2 heard, regular rate and rhythm. No extremity edema. No significant JVD. Abdomen: No distension, soft, tender in RLQ, no rebound pain or guarding. Bowel sounds active.   Musculoskeletal: no clubbing / cyanosis. No joint deformity upper and lower extremities.   Skin: no significant rashes, lesions, ulcers. Warm, dry, well-perfused. Neurologic: No gross facial asymmetry. Sensation to light touch intact. Strength 5/5 in all 4 limbs.  Psychiatric: Alert and oriented to person, place, and situation. Calm and cooperative.     Labs on Admission: I have personally reviewed following labs and imaging studies  CBC: Recent Labs  Lab 11/08/17 1700  WBC 12.4*  HGB 12.4*  HCT 37.8*  MCV 94.5  PLT 251   Basic Metabolic Panel: Recent Labs  Lab 11/08/17 1700  NA 138  K 4.2  CL 104  CO2 26  GLUCOSE 101*  BUN 25*  CREATININE 1.47*  CALCIUM 9.3  GFR: Estimated Creatinine Clearance: 51.2 mL/min (A) (by C-G formula based on SCr of 1.47 mg/dL (H)). Liver Function Tests: Recent Labs  Lab 11/08/17 1700  AST 21  ALT 14  ALKPHOS 35*  BILITOT 0.7  PROT 6.9  ALBUMIN 3.4*   Recent Labs  Lab 11/08/17 1700  LIPASE 28   No results for input(s): AMMONIA in the last 168 hours. Coagulation Profile: No results for input(s): INR, PROTIME in the last 168 hours. Cardiac Enzymes: No results for input(s): CKTOTAL, CKMB, CKMBINDEX, TROPONINI in the last 168 hours. BNP (last 3 results) No results for input(s): PROBNP in the last 8760 hours. HbA1C: No results for input(s): HGBA1C in the last 72 hours. CBG: No results for input(s): GLUCAP in the last 168 hours. Lipid Profile: No results for input(s): CHOL, HDL, LDLCALC, TRIG, CHOLHDL, LDLDIRECT in the last 72 hours. Thyroid Function Tests: No results for input(s): TSH, T4TOTAL, FREET4, T3FREE, THYROIDAB in the last 72 hours. Anemia Panel: No results for input(s): VITAMINB12, FOLATE, FERRITIN, TIBC, IRON, RETICCTPCT in the last 72 hours. Urine analysis:    Component Value Date/Time   COLORURINE AMBER (A) 03/29/2017 2117   APPEARANCEUR CLOUDY (A) 03/29/2017 2117   LABSPEC 1.019 03/29/2017 2117   PHURINE  6.0 03/29/2017 2117   GLUCOSEU NEGATIVE 03/29/2017 2117   HGBUR NEGATIVE 03/29/2017 2117   BILIRUBINUR NEGATIVE 03/29/2017 2117   KETONESUR NEGATIVE 03/29/2017 2117   PROTEINUR NEGATIVE 03/29/2017 2117   NITRITE POSITIVE (A) 03/29/2017 2117   LEUKOCYTESUR MODERATE (A) 03/29/2017 2117   Sepsis Labs: @LABRCNTIP (procalcitonin:4,lacticidven:4) )No results found for this or any previous visit (from the past 240 hour(s)).   Radiological Exams on Admission: No results found.  EKG: Independently reviewed. Sinus rhythm, non-specific ST-T abnormalities.   Assessment/Plan   1. Acute appendicitis  - Presents with 4 days of RLQ pain that he had been attributing to renal stone, but renal US was consistent with appendicitis per report  - He is afebrile and hemodynamically stable on admission with leukocytosis  - Surgery is consulting and much appreciated, will follow-up on recommendations  - Continue Zosyn, NPO, gentle IVF hydration, analgesia, check EKG, and type and screen    2. CAD  - No anginal complaints on admission  - Continue beta-blocker and statin as tolerated, hold ASA, Plavix, and ARB in anticipation of possible surgery    3. CKD stage III  - SCr is 1.47 on admission, similar to priors  - Renally-dose medications, provide gentle IVF hydration while NPO    4. Chronic diastolic CHF  - Appears well-compensated  - Gentle IVF hydration while NPO, follow daily wt and I/O's, continue Coreg as tolerated, hold losartan in anticipation of surgery    5. History of CVA  - Hold ASA and Plavix in anticipation of surgery - Continue statin as tolerated    DVT prophylaxis: SCD's  Code Status: Full  Family Communication: Wife updated at bedside Consults called: Surgery Admission status: Inpatient     Briscoe Deutscher, MD [facility] Pager [phone]  If 7PM-7AM, please contact night-coverage [email or web address] Password [facility]  11/08/2017, 7:34 PM

## 2017-11-08 NOTE — Progress Notes (Signed)
Pharmacy Antibiotic Note  [name] is a 72 y.o. male admitted on 11/08/2017 with intra-abdominal infection.  Pharmacy has been consulted for Zosyn dosing. Documented penicillin childhood allergy. Patient is unaware of the reaction, but was told by his mother he was allergic. He has never experienced a reaction during his adult life and is okay challenging the allergy. Discussed the allergy with provider and will proceed with Zosyn.  Plan: Zosyn 3.375g IV q8h (4 hour infusion).  Height: 5\' 9"  (175.3 cm) Weight: 218 lb 8 oz (99.1 kg) IBW/kg (Calculated) : 70.7  Temp (24hrs), Avg:98.1 F (36.7 C), Min:98.1 F (36.7 C), Max:98.1 F (36.7 C)  Recent Labs  Lab 11/08/17 1700  WBC 12.4*  CREATININE 1.47*    Estimated Creatinine Clearance: 51.2 mL/min (A) (by C-G formula based on SCr of 1.47 mg/dL (H)).    Allergies  Allergen Reactions  . Penicillins Other (See Comments)    From childhood: Has patient had a PCN reaction causing immediate rash, facial/tongue/throat swelling, SOB or lightheadedness with hypotension: Unk Has patient had a PCN reaction causing severe rash involving mucus membranes or skin necrosis: Unk Has patient had a PCN reaction that required hospitalization: Unk Has patient had a PCN reaction occurring within the last 10 years: No If all of the above answers are "NO", then may proceed with Cephalosporin use.     Thank you for allowing pharmacy to be a part of this patient's care.  Toniann Failony L Chatara Lucente 11/08/2017 6:22 PM

## 2017-11-08 NOTE — ED Triage Notes (Signed)
Pt reports going to urologist for ct scan of kidney stones without any findings for stones but incidentally found appendicitis. Pt reports RLQ abd intermittent pain upon palpation that has been going on since Sunday. Denies N/V/D, denies any other sx, denies pain at this time.

## 2017-11-09 LAB — BASIC METABOLIC PANEL
Anion gap: 8 (ref 5–15)
BUN: 23 mg/dL (ref 8–23)
CO2: 27 mmol/L (ref 22–32)
Calcium: 8.8 mg/dL — ABNORMAL LOW (ref 8.9–10.3)
Chloride: 105 mmol/L (ref 98–111)
Creatinine, Ser: 1.39 mg/dL — ABNORMAL HIGH (ref 0.61–1.24)
GFR calc Af Amer: 56 mL/min — ABNORMAL LOW (ref >60)
GFR calc non Af Amer: 48 mL/min — ABNORMAL LOW (ref >60)
Glucose, Bld: 92 mg/dL (ref 70–99)
Potassium: 4 mmol/L (ref 3.5–5.1)
Sodium: 140 mmol/L (ref 135–145)

## 2017-11-09 LAB — CBC WITH DIFFERENTIAL/PLATELET
Abs Immature Granulocytes: 0 10*3/uL (ref 0.0–0.1)
Basophils Absolute: 0 10*3/uL (ref 0.0–0.1)
Basophils Relative: 0 %
Eosinophils Absolute: 0.3 10*3/uL (ref 0.0–0.7)
Eosinophils Relative: 3 %
HCT: 34.1 % — ABNORMAL LOW (ref 39.0–52.0)
Hemoglobin: 11.3 g/dL — ABNORMAL LOW (ref 13.0–17.0)
Immature Granulocytes: 0 %
Lymphocytes Relative: 23 %
Lymphs Abs: 2.1 10*3/uL (ref 0.7–4.0)
MCH: 31.3 pg (ref 26.0–34.0)
MCHC: 33.1 g/dL (ref 30.0–36.0)
MCV: 94.5 fL (ref 78.0–100.0)
Monocytes Absolute: 0.9 10*3/uL (ref 0.1–1.0)
Monocytes Relative: 10 %
Neutro Abs: 5.7 10*3/uL (ref 1.7–7.7)
Neutrophils Relative %: 64 %
Platelets: 215 10*3/uL (ref 150–400)
RBC: 3.61 MIL/uL — ABNORMAL LOW (ref 4.22–5.81)
RDW: 13.2 % (ref 11.5–15.5)
WBC: 9.1 10*3/uL (ref 4.0–10.5)

## 2017-11-09 LAB — GLUCOSE, CAPILLARY: Glucose-Capillary: 85 mg/dL (ref 70–99)

## 2017-11-09 MED ORDER — CHLORHEXIDINE GLUCONATE 0.12 % MT SOLN
15.0000 mL | Freq: Two times a day (BID) | OROMUCOSAL | Status: DC
  Administered 2017-11-09 – 2017-11-12 (×5): 15 mL via OROMUCOSAL
  Filled 2017-11-09 (×5): qty 15

## 2017-11-09 MED ORDER — ORAL CARE MOUTH RINSE: 15.0000 mL | Freq: Two times a day (BID) | OROMUCOSAL | Status: DC

## 2017-11-09 NOTE — Progress Notes (Signed)
[facility] Progress Note     Subjective: CC:  Hungry. Denies fever, chills, nausea, vomiting. Denies abdominal pain at rest but still endorses RLQ tenderness to palpation. Last BM was 2 days ago. At baseline takes linzess in order to have a daily BM.   At baseline does not mobilize very far 2/2 knee pain. Denies chest pain, orthopnea, SOB. Reports that historically his chest pain has occurred at rest as a result of mental/emotional stress. Denies recent use of nitroglycerin.   Objective: Vital signs in last 24 hours: Temp:  [98.1 F (36.7 C)-98.4 F (36.9 C)] 98.4 F (36.9 C) (08/01 0555) Pulse Rate:  [66-70] 66 (08/01 0555) Resp:  [16-18] 18 (08/01 0555) BP: (103-128)/(57-71) 111/71 (08/01 0555) SpO2:  [98 %] 98 % (08/01 0555) Weight:  [99.1 kg (218 lb 8 oz)] 99.1 kg (218 lb 8 oz) (07/31 1642) Last BM Date: 11/07/17  Intake/Output from previous day: 07/31 0701 - 08/01 0700 In: 100 [IV Piggyback:100] Out: -   PE: Gen:  Alert, NAD, pleasant and cooperative  Card:  Regular rate and rhythm, pedal pulses 2+ BL Pulm:  Normal effort, clear to auscultation bilaterally Abd: Soft, obese, TTP RLQ with voluntary guarding, no peritonitis, +BS Skin: warm and dry, no rashes  Psych: A&Ox3   Lab Results:  Recent Labs    11/08/17 1700 11/09/17 0442  WBC 12.4* 9.1  HGB 12.4* 11.3*  HCT 37.8* 34.1*  PLT 251 215   BMET Recent Labs    11/08/17 1700 11/09/17 0442  NA 138 140  K 4.2 4.0  CL 104 105  CO2 26 27  GLUCOSE 101* 92  BUN 25* 23  CREATININE 1.47* 1.39*  CALCIUM 9.3 8.8*   CMP     Component Value Date/Time   NA 140 11/09/2017 0442   NA 140 10/24/2016 1149   K 4.0 11/09/2017 0442   CL 105 11/09/2017 0442   CO2 27 11/09/2017 0442   GLUCOSE 92 11/09/2017 0442   BUN 23 11/09/2017 0442   BUN 22 10/24/2016 1149   CREATININE 1.39 (H) 11/09/2017 0442   CALCIUM 8.8 (L) 11/09/2017 0442   PROT 6.9 11/08/2017 1700   PROT 6.8 10/24/2016 1149   ALBUMIN 3.4  (L) 11/08/2017 1700   ALBUMIN 4.1 10/24/2016 1149   AST 21 11/08/2017 1700   ALT 14 11/08/2017 1700   ALKPHOS 35 (L) 11/08/2017 1700   BILITOT 0.7 11/08/2017 1700   BILITOT 0.3 10/24/2016 1149   GFRNONAA 48 (L) 11/09/2017 0442   GFRAA 56 (L) 11/09/2017 0442   Lipase     Component Value Date/Time   LIPASE 28 11/08/2017 1700   Anti-infectives: Anti-infectives (From admission, onward)   Start     Dose/Rate Route Frequency Ordered Stop   11/09/17 0200  piperacillin-tazobactam (ZOSYN) IVPB 3.375 g     3.375 g 12.5 mL/hr over 240 Minutes Intravenous Every 8 hours 11/08/17 1821     11/08/17 1830  piperacillin-tazobactam (ZOSYN) IVPB 3.375 g     3.375 g 100 mL/hr over 30 Minutes Intravenous  Once 11/08/17 1821 11/08/17 2031     Assessment/Plan DM HTN HLD CAD CKD III PMH CVA in 2018 PMH MI >15 years ago on plavix  Uncomplicated acute appendicitis  -  afebrile, VSS, leukocytosis resolved  -  Cards eval pending, pts cardiologist is Dr. Jacinto Halim  -  No appendicolith on CT, abdominal exam relatively benign  -  Continue IV abx   FEN: NPO, IVF, if continues to clinically improve  start clear liquid diet tomorrow  ID: Zosyn 7/31 >> VTE: SCD's, ok to start chemical VTE from surgical perspective  Foley: none    LOS: 1 day    Adam PhenixElizabeth S Mckinnon Glick , [facility] 11/09/2017, 8:51 AM Pager: [phone] Consults: [phone] Mon-Fri 7:00 am-4:30 pm Sat-Sun 7:00 am-11:30 am

## 2017-11-09 NOTE — Plan of Care (Signed)
  Problem: Nutrition: Goal: Adequate nutrition will be maintained Outcome: Progressing   Problem: Pain Managment: Goal: General experience of comfort will improve Outcome: Progressing   

## 2017-11-09 NOTE — Progress Notes (Addendum)
PROGRESS NOTE                                                                                                                                                                                                             Patient Demographics:    [name], is a 72 y.o. male, DOB - [birth year], JYN:[id number]  Admit date - 11/08/2017   Admitting Physician Briscoe Deutscher, MD  Outpatient Primary MD for the patient is [name], MD  LOS - 1  Outpatient Specialists: Dr Jacinto Halim  Chief Complaint  Patient presents with  . Abdominal Pain    RLQ       Brief Narrative   72 year old male with history of coronary artery disease status post CABG in 38, history of CVA and left carotid endarterectomy, CK D stage III presented to the ED with 4 days of right lower quadrant abdominal pain with CT abdomen done as outpatient concerning for acute appendicitis. Surgery consulted for further management.   Subjective:   Patient conditions of right lower quadrant pain worsened on movement. is hungry   Assessment  & Plan :    Principal Problem:   Acute appendicitis Surgery on board. Hemodynamically stable. Leukocytosis has resolved. Patient's cardiologist Dr. Jacinto Halim consulted regarding Plavix use. Last dose was yesterday). Surgery recommended to continue IV fluids, keep nothing by mouth and empiric antibiotics (IV Zosyn). If clinically improved and no surgery planned, will be started on clears from tomorrow.  Active Problems:   CAD (coronary artery disease) of artery bypass graft No chest pain symptoms. EKG on admission unremarkable. Holding aspirin, Plavix and ARB anticipating for possible surgery. Continue beta blocker and statin. Spoke with his cardiologist Dr Jacinto Halim who recommended to continue holding plavix and ok for performing surgery if urgently needed. He will see the patient in consultation.    CKD stage 3 due to type 2 diabetes  mellitus (HCC) Renal function at baseline. Continue gentle hydration and holding ARB.    History of CVA/ hx of  left carotid endarterectomy. Aspirin and Plavix on hold. Continue statin.  Chronic diastolic CHF Euvolemic at present.  Continue beta blocker and statin.  Diabetes mellitus type 2, controlled Not on any medications. Last A1c in 2018 of 5.8.    Code Status : full code  Family Communication  : wife at bedside  Disposition  Plan  : home pending hospital course  Barriers For Discharge : active symptoms  Consults  :  Surgery, cardiology (Dr Jacinto HalimGanji)  Procedures  :none  DVT Prophylaxis  :   SCDs  Lab Results  Component Value Date   PLT 215 11/09/2017    Antibiotics  :    Anti-infectives (From admission, onward)   Start     Dose/Rate Route Frequency Ordered Stop   11/09/17 0200  piperacillin-tazobactam (ZOSYN) IVPB 3.375 g     3.375 g 12.5 mL/hr over 240 Minutes Intravenous Every 8 hours 11/08/17 1821     11/08/17 1830  piperacillin-tazobactam (ZOSYN) IVPB 3.375 g     3.375 g 100 mL/hr over 30 Minutes Intravenous  Once 11/08/17 1821 11/08/17 2031        Objective:   Vitals:   11/08/17 1639 11/08/17 1642 11/08/17 2050 11/09/17 0555  BP: (!) 103/57  128/63 111/71  Pulse: 70  69 66  Resp: 16  18 18   Temp: 98.1 F (36.7 C)  98.3 F (36.8 C) 98.4 F (36.9 C)  TempSrc: Oral  Oral Oral  SpO2: 98%  98% 98%  Weight:  99.1 kg (218 lb 8 oz)    Height:  5\' 9"  (1.753 m)      Wt Readings from Last 3 Encounters:  11/08/17 99.1 kg (218 lb 8 oz)  11/07/17 98.9 kg (218 lb)  06/26/17 96.2 kg (212 lb)     Intake/Output Summary (Last 24 hours) at 11/09/2017 1023 Last data filed at 11/08/2017 2031 Gross per 24 hour  Intake 100 ml  Output -  Net 100 ml     Physical Exam  Gen: not in distress HEENT:  moist mucosa, supple neck Chest: clear b/l, no added sounds CVS: N S1&S2, no murmurs, rubs or gallop GI: soft, nondistended, bowel sounds present, mild right  lower quadrant tenderness to deep pressure Musculoskeletal: warm, no edema     Data Review:    CBC Recent Labs  Lab 11/08/17 1700 11/09/17 0442  WBC 12.4* 9.1  HGB 12.4* 11.3*  HCT 37.8* 34.1*  PLT 251 215  MCV 94.5 94.5  MCH 31.0 31.3  MCHC 32.8 33.1  RDW 13.4 13.2  LYMPHSABS  --  2.1  MONOABS  --  0.9  EOSABS  --  0.3  BASOSABS  --  0.0    Chemistries  Recent Labs  Lab 11/08/17 1700 11/09/17 0442  NA 138 140  K 4.2 4.0  CL 104 105  CO2 26 27  GLUCOSE 101* 92  BUN 25* 23  CREATININE 1.47* 1.39*  CALCIUM 9.3 8.8*  AST 21  --   ALT 14  --   ALKPHOS 35*  --   BILITOT 0.7  --    ------------------------------------------------------------------------------------------------------------------ No results for input(s): CHOL, HDL, LDLCALC, TRIG, CHOLHDL, LDLDIRECT in the last 72 hours.  Lab Results  Component Value Date   HGBA1C 5.8 (H) 03/30/2017   ------------------------------------------------------------------------------------------------------------------ No results for input(s): TSH, T4TOTAL, T3FREE, THYROIDAB in the last 72 hours.  Invalid input(s): FREET3 ------------------------------------------------------------------------------------------------------------------ No results for input(s): VITAMINB12, FOLATE, FERRITIN, TIBC, IRON, RETICCTPCT in the last 72 hours.  Coagulation profile No results for input(s): INR, PROTIME in the last 168 hours.  No results for input(s): DDIMER in the last 72 hours.  Cardiac Enzymes No results for input(s): CKMB, TROPONINI, MYOGLOBIN in the last 168 hours.  Invalid input(s): CK ------------------------------------------------------------------------------------------------------------------ No results found for: BNP  Inpatient Medications  Scheduled Meds: . atorvastatin  20 mg Oral  q1800  . carvedilol  25 mg Oral BID WC  . chlorhexidine  15 mL Mouth Rinse BID  . DULoxetine  60 mg Oral QHS  . isosorbide  mononitrate  60 mg Oral BID  . mouth rinse  15 mL Mouth Rinse q12n4p  . ranolazine  1,000 mg Oral BID   Continuous Infusions: . piperacillin-tazobactam (ZOSYN)  IV 3.375 g (11/09/17 0418)   PRN Meds:.acetaminophen **OR** acetaminophen, ALPRAZolam, morphine injection, ondansetron **OR** ondansetron (ZOFRAN) IV  Micro Results No results found for this or any previous visit (from the past 240 hour(s)).  Radiology Reports No results found.  Time Spent in minutes  25   Skylene Deremer M.D on 11/09/2017 at 10:23 AM  Between 7am to 7pm - Pager - [phone]  After 7pm go to [email or web address] - password [facility]  [facility] -  Office  [phone]

## 2017-11-10 LAB — GLUCOSE, CAPILLARY
Glucose-Capillary: 65 mg/dL — ABNORMAL LOW (ref 70–99)
Glucose-Capillary: 114 mg/dL — ABNORMAL HIGH (ref 70–99)

## 2017-11-10 MED ORDER — HEPARIN SODIUM (PORCINE) 5000 UNIT/ML IJ SOLN
5000.0000 [IU] | Freq: Three times a day (TID) | INTRAMUSCULAR | Status: DC
  Administered 2017-11-10 – 2017-11-12 (×6): 5000 [IU] via SUBCUTANEOUS
  Filled 2017-11-10 (×6): qty 1

## 2017-11-10 NOTE — Care Management Important Message (Signed)
Important Message  Patient Details  Name: [name] MRN: [id number] Date of Birth: 05/25/1945   Medicare Important Message Given:  Yes    [name] [name] 11/10/2017, 2:54 PM

## 2017-11-10 NOTE — Consult Note (Signed)
Reason for Consult: Pre-op risk stratification Referring Physician: [facility]  [name] is an 72 y.o. male.  HPI:   72 y/o Caucasian male w/CAD s/PCABG in 2006, chronic stable angina, PAD s/p left carotid endarterectomy, chronic stable claudication, hypertension, controlled type 2 DM, hyperlipidemia.  He is not admitted to the hospital with acute appendicitis and being treated with antibiotics. Cardiac risk stratification is sought, should he need to undergo surgery.   With his myriad of cardiovascular problems, he has been doing fairly well. He uses [facility] NTG once every couple of weeks. His activity is limited due to knee pain, which limits assessment of exertional angina or dyspnea. No recent admission with ACS/heart failure. Workup within past one year in the office shows mild Rt ICA stenosis, mild reduction in LVEF< mild AS< mild MR, no reversible ischemia on stress test.  Past Medical History:  Diagnosis Date  . Anxiety   . CAD (coronary artery disease) of artery bypass graft 12/09/2013  . Chronic lower back pain   . Coronary artery disease   . Dementia    "step dementia; from mini strokes in 2018" (11/08/2017)  . Depression   . Diabetes mellitus    "totally diet controlled" (11/08/2017)  . Hearing loss    wears bilateral hearing aids  . High cholesterol   . History of kidney stones   . Hypertension   . MI (myocardial infarction) ([place]) [phone]   (several)  . On home oxygen therapy    "2L when he sleeps at night" (11/08/2017)  . Osteoarthritis of both knees    "both knees need replaced" (11/08/2017)  . Sleep apnea    "mild; uses oxygen" (11/08/2017)  . Stroke [facility]) 2018   "several silent ones; a little memory loss; step dementia" (11/08/2017)    Past Surgical History:  Procedure Laterality Date  . 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 CARDIAC CATHETERIZATION     "many"  . CAROTID ENDARTERECTOMY Left    . CORONARY ARTERY BYPASS GRAFT  1996   5 vessels  . FRACTURE SURGERY    . KIDNEY STONE SURGERY  ~ 2014   "laparoscopic, robotic OR"  . WRIST FRACTURE SURGERY Left 2001  . WRIST SURGERY      Family History  Problem Relation Age of Onset  . Heart disease Mother   . Liver cancer Unknown   . Parkinson's disease Unknown   . Heart disease Brother   . Parkinsonism Sister   . Cancer Sister     Social History:  reports that he quit smoking about 34 years ago. His smoking use included cigarettes. He has a 40.00 pack-year smoking history. He has never used smokeless tobacco. He reports that he drank alcohol. He reports that he has current or past drug history. Drug: Marijuana.  Allergies:  Allergies  Allergen Reactions  . Penicillins Other (See Comments)    From childhood: Has patient had a PCN reaction causing immediate rash, facial/tongue/throat swelling, SOB or lightheadedness with hypotension: Unk Has patient had a PCN reaction causing severe rash involving mucus membranes or skin necrosis: Unk Has patient had a PCN reaction that required hospitalization: Unk Has patient had a PCN reaction occurring within the last 10 years: No If all of the above answers are "NO", then may proceed with Cephalosporin use.     Medications: I have reviewed the patient's current medications.  Results for orders placed or performed during  the hospital encounter of 11/08/17 (from the past 48 hour(s))  Lipase, blood     Status: None   Collection Time: 11/08/17  5:00 PM  Result Value Ref Range   Lipase 28 11 - 51 U/L    Comment: Performed at [facility], [address], [place], [place] [address]  Comprehensive metabolic panel     Status: Abnormal   Collection Time: 11/08/17  5:00 PM  Result Value Ref Range   Sodium 138 135 - 145 mmol/L   Potassium 4.2 3.5 - 5.1 mmol/L   Chloride 104 98 - 111 mmol/L   CO2 26 22 - 32 mmol/L   Glucose, Bld 101 (H) 70 - 99 mg/dL   BUN 25 (H) 8 - 23 mg/dL    Creatinine, Ser 1.47 (H) 0.61 - 1.24 mg/dL   Calcium 9.3 8.9 - 10.3 mg/dL   Total Protein 6.9 6.5 - 8.1 g/dL   Albumin 3.4 (L) 3.5 - 5.0 g/dL   AST 21 15 - 41 U/L   ALT 14 0 - 44 U/L   Alkaline Phosphatase 35 (L) 38 - 126 U/L   Total Bilirubin 0.7 0.3 - 1.2 mg/dL   GFR calc non Af Amer 45 (L) >60 mL/min   GFR calc Af Amer 52 (L) >60 mL/min    Comment: (NOTE) The eGFR has been calculated using the CKD EPI equation. This calculation has not been validated in all clinical situations. eGFR's persistently <60 mL/min signify possible Chronic Kidney Disease.    Anion gap 8 5 - 15    Comment: Performed at [facility] [address], [place], [place] [address]  CBC     Status: Abnormal   Collection Time: 11/08/17  5:00 PM  Result Value Ref Range   WBC 12.4 (H) 4.0 - 10.5 K/uL   RBC 4.00 (L) 4.22 - 5.81 MIL/uL   Hemoglobin 12.4 (L) 13.0 - 17.0 g/dL   HCT 37.8 (L) 39.0 - 52.0 %   MCV 94.5 78.0 - 100.0 fL   MCH 31.0 26.0 - 34.0 pg   MCHC 32.8 30.0 - 36.0 g/dL   RDW 13.4 11.5 - 15.5 %   Platelets 251 150 - 400 K/uL    Comment: Performed at [facility], [place] [address] [place], [place] [address]  Urinalysis, Routine w reflex microscopic     Status: Abnormal   Collection Time: 11/08/17  8:52 PM  Result Value Ref Range   Color, Urine AMBER (A) YELLOW    Comment: BIOCHEMICALS MAY BE AFFECTED BY COLOR   APPearance CLEAR CLEAR   Specific Gravity, Urine 1.019 1.005 - 1.030   pH 5.0 5.0 - 8.0   Glucose, UA NEGATIVE NEGATIVE mg/dL   Hgb urine dipstick NEGATIVE NEGATIVE   Bilirubin Urine NEGATIVE NEGATIVE   Ketones, ur NEGATIVE NEGATIVE mg/dL   Protein, ur NEGATIVE NEGATIVE mg/dL   Nitrite NEGATIVE NEGATIVE   Leukocytes, UA NEGATIVE NEGATIVE    Comment: Performed at [facility] [address] [place], [place] [address]  Type and screen [place]     Status: None   Collection Time: 11/08/17  9:00 PM  Result Value Ref Range   ABO/RH(D) A POS     Antibody Screen NEG    Sample Expiration      11/11/2017 Performed at [facility], [place] [address], [place], [place] [address]   ABO/Rh     Status: None   Collection Time: 11/08/17  9:00 PM  Result Value Ref Range   ABO/RH(D)      A POS Performed at [facility] [address] [place], [place] [address]   Basic metabolic panel     Status: Abnormal   Collection Time: 11/09/17  4:42 AM  Result Value Ref Range   Sodium 140 135 - 145 mmol/L   Potassium 4.0 3.5 - 5.1 mmol/L   Chloride 105 98 - 111 mmol/L   CO2 27 22 - 32 mmol/L   Glucose, Bld 92 70 - 99 mg/dL   BUN 23 8 - 23 mg/dL   Creatinine, Ser 1.39 (H) 0.61 - 1.24 mg/dL   Calcium 8.8 (L) 8.9 - 10.3 mg/dL   GFR calc non Af Amer 48 (L) >60 mL/min   GFR calc Af Amer 56 (L) >60 mL/min    Comment: (NOTE) The eGFR has been calculated using the CKD EPI equation. This calculation has not been validated in all clinical situations. eGFR's persistently <60 mL/min signify possible Chronic Kidney Disease.    Anion gap 8 5 - 15    Comment: Performed at [facility] [address], [place], [place] [address]  CBC WITH DIFFERENTIAL     Status: Abnormal   Collection Time: 11/09/17  4:42 AM  Result Value Ref Range   WBC 9.1 4.0 - 10.5 K/uL   RBC 3.61 (L) 4.22 - 5.81 MIL/uL   Hemoglobin 11.3 (L) 13.0 - 17.0 g/dL   HCT 34.1 (L) 39.0 - 52.0 %   MCV 94.5 78.0 - 100.0 fL   MCH 31.3 26.0 - 34.0 pg   MCHC 33.1 30.0 - 36.0 g/dL   RDW 13.2 11.5 - 15.5 %   Platelets 215 150 - 400 K/uL   Neutrophils Relative % 64 %   Neutro Abs 5.7 1.7 - 7.7 K/uL   Lymphocytes Relative 23 %   Lymphs Abs 2.1 0.7 - 4.0 K/uL   Monocytes Relative 10 %   Monocytes Absolute 0.9 0.1 - 1.0 K/uL   Eosinophils Relative 3 %   Eosinophils Absolute 0.3 0.0 - 0.7 K/uL   Basophils Relative 0 %   Basophils Absolute 0.0 0.0 - 0.1 K/uL   Immature Granulocytes 0 %   Abs Immature Granulocytes 0.0 0.0 - 0.1 K/uL    Comment: Performed at [facility], [address], [place], [place] [address]  Glucose, capillary     Status: None   Collection Time: 11/09/17  8:08 AM  Result Value Ref Range   Glucose-Capillary 85 70 - 99 mg/dL    No results found.  Review of Systems  Constitutional: Negative.  Negative for chills and fever.  HENT: Negative.   Eyes: Negative.   Respiratory: Negative for shortness of breath.   Cardiovascular: Negative for chest pain, palpitations, leg swelling and PND.  Gastrointestinal: Negative for abdominal pain (Currently absent).  Musculoskeletal: Positive for joint pain.  Neurological: Negative for dizziness and loss of consciousness.  Endo/Heme/Allergies: Does not bruise/bleed easily.  Psychiatric/Behavioral: Negative.    Blood pressure 133/66, pulse 70, temperature 98.4 F (36.9 C), temperature source Oral, resp. rate 18, height '5\' 9"'$  (1.753 m), weight 100 kg (220 lb 7.4 oz), SpO2 97 %. Physical Exam  Nursing note and vitals reviewed. Constitutional: He is oriented to person, place, and time. He appears well-developed and well-nourished. No distress.  HENT:  Head: Normocephalic and atraumatic.  Eyes: Pupils are equal, round, and reactive to light. Conjunctivae are normal.  Neck: Normal  range of motion. Neck supple. No JVD present.  Left carotid endarterectomy scar   Cardiovascular: Normal rate and regular rhythm.  Murmur (II/VI RUSB crescenso murmur) heard. DIminished distal pulses  Respiratory: Effort normal and breath sounds normal. He has no wheezes.  GI: Soft. Bowel sounds are normal. He exhibits distension. There is no tenderness. There is no rebound and no guarding.  Musculoskeletal: He exhibits no edema.  Lymphadenopathy:    He has no cervical adenopathy.  Neurological: He is alert and oriented to person, place, and time. He has normal reflexes.  Skin: Skin is warm and dry.  Psychiatric: He has a normal mood and affect.    Cardiac studies: EKG 11/08/2017: Sinus rhythm 67 bpm.   Normal axis.  Normal conduction.  Nonspecific ST-T changes.  Carotid artery duplex 05/03/2017: Stenosis in the right internal carotid artery (16-49%). Antegrade right vertebral artery flow. Antegrade left vertebral artery flow. Follow up in one year is appropriate if clinically indicated.  Compared to the study done on 12/24/2015, minimal progression on the right ICA stenosis.  Echocardiogram 12/06/2016: Left ventricle cavity is normal in size. Moderate concentric hypertrophy of the left ventricle.  Inferior and inferolateral moderate hypokinesis to akinesis. Doppler evidence of grade I (impaired) diastolic dysfunction.  Visual LVEF 40-45%.   Left atrial cavity is moderately dilated at 4.5 cm. Trace aortic regurgitation. Moderate calcification of the aortic valve annulus. Mild aortic valve leaflet calcification. Mildly restricted aortic valve leaflets. Mild aortic valve stenosis. Aortic valve peak pressure gradient of  30 and mean gradient of  17.5 mmHg, calculated aortic valve area 1.45 cm. Mild (Grade I) mitral regurgitation. Trace tricuspid regurgitation. Unable to estimate PA pressure due to absence/minimal TR signal. Compared to report from Echocardiogram 05/31/2012, no significant change.  Grade 2 diastolic dysfunction.  Lexiscan myoview stress test 12/30/2016: 1. Pharmacologic stress testing was performed with intravenous administration of .4 mg of Lexiscan over a 10-15 seconds infusion. Stress symptoms included dyspnea.  2. The resting electrocardiogram demonstrated normal sinus rhythm, IVCD and no resting arrhythmias.  Old inferior infarct seen. Stress EKG is non diagnostic for ischemia as it is a pharmacologic stress.  3. SPECT images and gated SPECT imaging reveal old inferior infarct with no appreciable per infarct ischemia. The left ventricular ejection fraction was calculated at 39% 4. This is an intermediate risk study.  Assessment/Recommendations:  72 y/o Caucasian male w/CAD  s/PCABG in 2006, chronic stable angina, PAD s/p left carotid endarterectomy, chronic stable claudication, hypertension, controlled type 2 DM, hyperlipidemia.  Pre-op cardiac risk stratification: His cardiac risk will always be elevated, by virtue of his basleline CAD, PAD. However, he is not having any current chest pain, heart failure. EKG is normal. Workup within past one year is reassuring. He is on excellent medical therapy, which will help reduce his perioperative cardiac risk. Barring holding plavix, as you are doing, I do not recommend any other diagnostic procedures/ interventions, as it will not change the management.  Above recommendations are consistent with 2014 ACC/AHA perioperative cardiac risk stratification guidelines.  CAD/PAD: Continue baseline medical therapy.  Shauntay Brunelli J Quintell Bonnin 11/10/2017, 7:22 AM   [place], MD [facility] Pager: [phone] Office: [phone] If no answer Cell [phone]

## 2017-11-10 NOTE — Progress Notes (Signed)
PROGRESS NOTE                                                                                                                                                                                                             Patient Demographics:    [name], is a 72 y.o. male, DOB - [birth year], ZOX:[id number]  Admit date - 11/08/2017   Admitting Physician Briscoe Deutscher, MD  Outpatient Primary MD for the patient is [name], MD  LOS - 2  Outpatient Specialists: Dr Jacinto Halim  Chief Complaint  Patient presents with  . Abdominal Pain    RLQ       Brief Narrative   72 year old male with history of coronary artery disease status post CABG in 61, history of CVA and left carotid endarterectomy, CKD stage III presented to the ED with 4 days of right lower quadrant abdominal pain with CT abdomen done as outpatient concerning for acute appendicitis. Surgery consulted for further management.   Subjective:   Reports his right lower quadrant pain to be better and wants to eat. CBG was going the 60s and given some juice.   Assessment  & Plan :    Principal Problem:   Acute appendicitis Surgery following. Remains afebrile and leukocytosis resolved. Continue empiric IV Zosyn. Seen by cardiologist today and recommended holding Plavix in case patient needs surgery and no preop cardiac evaluation needed. Plan is to continue nonoperative management for now. Started on clear liquids today.  Active Problems:   CAD (coronary artery disease) of artery bypass graft No chest pain symptoms. EKG on admission unremarkable. Continue to hold aspirin and Plavix in case patient needs surgery. ARB on hold in case he needs surgery.    CKD stage 3 due to type 2 diabetes mellitus (HCC) Renal function at baseline. Continue gentle hydration. ARB on hold.    History of CVA/ hx of  left carotid endarterectomy. Aspirin and Plavix on hold. Continue  statin.  Chronic diastolic CHF Euvolemic at present.  Continue beta blocker and statin. Patient stable by tomorrow can resume both aspirin and ARB.  Diabetes mellitus type 2, controlled Not on any medications. CBG dropped to 60s as patient is nothing by mouth and improved after receiving some grape juice. Last A1c in 2018 of 5.8. Monitor on sliding scale coverage.    Code Status :  full code  Family Communication  : None at bedside  Disposition Plan  : home possibly in the next 48 hours if symptoms continue to improve and tolerating advanced diet.  Barriers For Discharge : active symptoms  Consults  :  Surgery, cardiology (Dr Rosemary HolmsPatwardhan)  Procedures  :none  DVT Prophylaxis  :   SCDs  Lab Results  Component Value Date   PLT 215 11/09/2017    Antibiotics  :    Anti-infectives (From admission, onward)   Start     Dose/Rate Route Frequency Ordered Stop   11/09/17 0200  piperacillin-tazobactam (ZOSYN) IVPB 3.375 g     3.375 g 12.5 mL/hr over 240 Minutes Intravenous Every 8 hours 11/08/17 1821     11/08/17 1830  piperacillin-tazobactam (ZOSYN) IVPB 3.375 g     3.375 g 100 mL/hr over 30 Minutes Intravenous  Once 11/08/17 1821 11/08/17 2031        Objective:   Vitals:   11/09/17 1358 11/09/17 2142 11/10/17 0500 11/10/17 0538  BP: 119/70 129/73  133/66  Pulse: 64 70  70  Resp: 16 20  18   Temp: 98 F (36.7 C) 97.8 F (36.6 C)  98.4 F (36.9 C)  TempSrc: Oral Oral  Oral  SpO2: 98% 96%  97%  Weight:   100 kg (220 lb 7.4 oz)   Height:        Wt Readings from Last 3 Encounters:  11/10/17 100 kg (220 lb 7.4 oz)  11/07/17 98.9 kg (218 lb)  06/26/17 96.2 kg (212 lb)     Intake/Output Summary (Last 24 hours) at 11/10/2017 1028 Last data filed at 11/10/2017 0827 Gross per 24 hour  Intake 120 ml  Output -  Net 120 ml    Physical exam  Elderly male not in distress HEENT: Moist, supple neck Chest: Clear bilaterally CVS: Normal S1 and S2, no murmurs General: Soft,  nondistended, bowel sounds present, mild right lower quadrant tenderness to deep pressure (improved from yesterday) Musculoskeletal: Warm, no edema      Data Review:    CBC Recent Labs  Lab 11/08/17 1700 11/09/17 0442  WBC 12.4* 9.1  HGB 12.4* 11.3*  HCT 37.8* 34.1*  PLT 251 215  MCV 94.5 94.5  MCH 31.0 31.3  MCHC 32.8 33.1  RDW 13.4 13.2  LYMPHSABS  --  2.1  MONOABS  --  0.9  EOSABS  --  0.3  BASOSABS  --  0.0    Chemistries  Recent Labs  Lab 11/08/17 1700 11/09/17 0442  NA 138 140  K 4.2 4.0  CL 104 105  CO2 26 27  GLUCOSE 101* 92  BUN 25* 23  CREATININE 1.47* 1.39*  CALCIUM 9.3 8.8*  AST 21  --   ALT 14  --   ALKPHOS 35*  --   BILITOT 0.7  --    ------------------------------------------------------------------------------------------------------------------ No results for input(s): CHOL, HDL, LDLCALC, TRIG, CHOLHDL, LDLDIRECT in the last 72 hours.  Lab Results  Component Value Date   HGBA1C 5.8 (H) 03/30/2017   ------------------------------------------------------------------------------------------------------------------ No results for input(s): TSH, T4TOTAL, T3FREE, THYROIDAB in the last 72 hours.  Invalid input(s): FREET3 ------------------------------------------------------------------------------------------------------------------ No results for input(s): VITAMINB12, FOLATE, FERRITIN, TIBC, IRON, RETICCTPCT in the last 72 hours.  Coagulation profile No results for input(s): INR, PROTIME in the last 168 hours.  No results for input(s): DDIMER in the last 72 hours.  Cardiac Enzymes No results for input(s): CKMB, TROPONINI, MYOGLOBIN in the last 168 hours.  Invalid input(s): CK ------------------------------------------------------------------------------------------------------------------  No results found for: BNP  Inpatient Medications  Scheduled Meds: . atorvastatin  20 mg Oral q1800  . carvedilol  25 mg Oral BID WC  .  chlorhexidine  15 mL Mouth Rinse BID  . DULoxetine  60 mg Oral QHS  . isosorbide mononitrate  60 mg Oral BID  . mouth rinse  15 mL Mouth Rinse q12n4p  . ranolazine  1,000 mg Oral BID   Continuous Infusions: . piperacillin-tazobactam (ZOSYN)  IV 3.375 g (11/10/17 0524)   PRN Meds:.acetaminophen **OR** acetaminophen, ALPRAZolam, morphine injection, ondansetron **OR** ondansetron (ZOFRAN) IV  Micro Results No results found for this or any previous visit (from the past 240 hour(s)).  Radiology Reports No results found.  Time Spent in minutes  25   Amaia Lavallie M.D on 11/10/2017 at 10:28 AM  Between 7am to 7pm - Pager - [phone]  After 7pm go to [email or web address] - password [facility]  [facility] -  Office  [phone]

## 2017-11-10 NOTE — Plan of Care (Signed)
  Problem: Clinical Measurements: Goal: Will remain free from infection Outcome: Progressing Goal: Diagnostic test results will improve Outcome: Progressing   

## 2017-11-10 NOTE — Progress Notes (Signed)
Patient ID: [name], male   DOB: 02/18/1946, 72 y.o.   MRN: [id number]   Acute Care Surgery Service Progress Note:    Chief Complaint/Subjective: Denies abd pain, "it's sore if you press down there"  No n/v Had BM Walking/spending time in chair  Objective: Vital signs in last 24 hours: Temp:  [97.8 F (36.6 C)-98.4 F (36.9 C)] 98.4 F (36.9 C) (08/02 0538) Pulse Rate:  [64-70] 70 (08/02 0538) Resp:  [16-20] 18 (08/02 0538) BP: (119-133)/(66-73) 133/66 (08/02 0538) SpO2:  [96 %-98 %] 97 % (08/02 0538) Weight:  [100 kg (220 lb 7.4 oz)] 100 kg (220 lb 7.4 oz) (08/02 0500) Last BM Date: 11/10/17  Intake/Output from previous day: No intake/output data recorded. Intake/Output this shift: Total I/O In: 600 [P.O.:600] Out: -   Lungs: cta, nonlabored  Cardiovascular: reg  Abd: soft, mild RLQ TTP; no rebound/guarding/peritonitis  Extremities: no edema, +SCDs  Neuro: alert, nonfocal  Lab Results: CBC  Recent Labs    11/08/17 1700 11/09/17 0442  WBC 12.4* 9.1  HGB 12.4* 11.3*  HCT 37.8* 34.1*  PLT 251 215   BMET Recent Labs    11/08/17 1700 11/09/17 0442  NA 138 140  K 4.2 4.0  CL 104 105  CO2 26 27  GLUCOSE 101* 92  BUN 25* 23  CREATININE 1.47* 1.39*  CALCIUM 9.3 8.8*   LFT Hepatic Function Latest Ref Rng & Units 11/08/2017 03/29/2017 10/24/2016  Total Protein 6.5 - 8.1 g/dL 6.9 6.4(L) 6.8  Albumin 3.5 - 5.0 g/dL 3.4(L) 3.2(L) 4.1  AST 15 - 41 U/L _0 ALT 0 - 44 U/L 14 16(L) 22  Alk Phosphatase 38 - 126 U/L 35(L) 37(L) 39  Total Bilirubin 0.3 - 1.2 mg/dL 0.7 0.7 0.3   PT/INR No results for input(s): LABPROT, INR in the last 72 hours. ABG No results for input(s): PHART, HCO3 in the last 72 hours.  Invalid input(s): PCO2, PO2  Studies/Results:  Anti-infectives: Anti-infectives (From admission, onward)   Start     Dose/Rate Route Frequency Ordered Stop   11/09/17 0200  piperacillin-tazobactam (ZOSYN) IVPB 3.375 g     3.375 g 12.5  mL/hr over 240 Minutes Intravenous Every 8 hours 11/08/17 1821     11/08/17 1830  piperacillin-tazobactam (ZOSYN) IVPB 3.375 g     3.375 g 100 mL/hr over 30 Minutes Intravenous  Once 11/08/17 1821 11/08/17 2031      Medications: Scheduled Meds: . atorvastatin  20 mg Oral q1800  . carvedilol  25 mg Oral BID WC  . chlorhexidine  15 mL Mouth Rinse BID  . DULoxetine  60 mg Oral QHS  . heparin injection (subcutaneous)  5,000 Units Subcutaneous Q8H  . isosorbide mononitrate  60 mg Oral BID  . mouth rinse  15 mL Mouth Rinse q12n4p  . ranolazine  1,000 mg Oral BID   Continuous Infusions: . piperacillin-tazobactam (ZOSYN)  IV 3.375 g (11/10/17 0524)   PRN Meds:.acetaminophen **OR** acetaminophen, ALPRAZolam, morphine injection, ondansetron **OR** ondansetron (ZOFRAN) IV  Assessment/Plan: Patient Active Problem List   Diagnosis Date Noted  . Acute appendicitis 11/08/2017  . Supplemental oxygen dependent 11/07/2017  . MCI (mild cognitive impairment) with memory loss 11/07/2017  . Sleep-related hypoxia 06/26/2017  . Chronic diastolic CHF (congestive heart failure) ([place]) 06/26/2017  . Stroke-like symptoms 03/30/2017  . CKD stage 3 due to type 2 diabetes mellitus ([place])   . Benign essential HTN   . Coronary artery disease involving native coronary artery  of native heart without angina pectoris   . TIA (transient ischemic attack) 03/29/2017  . 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 History of completed stroke 10/24/2016  . Coronary artery disease involving coronary bypass graft with unspecified angina pectoris 05/21/2014  . Sleep related hypoventilation/hypoxemia in other disease 01/15/2014  . Primary snoring 12/09/2013  . Hypersomnia with sleep apnea, unspecified 12/09/2013  . CAD (coronary artery disease) of artery bypass graft 12/09/2013   DM HTN HLD CAD CKD III PMH CVA in 2018 PMH MI >15 years ago on plavix  Uncomplicated acute appendicitis    -  afebrile, VSS, leukocytosis resolved  -min TTP -  cards - elevated risk -  No appendicolith on CT, abdominal exam relatively benign  -  Continue IV abx  -will continue with medical mgmt for now.   FEN: IVF, start clear liquid diet today  ID: Zosyn 7/31 >> VTE: SCD's,start subcu heparin  Foley: none    LOS: 2 days    Leighton Ruff. Redmond Pulling, MD, FACS General, Bariatric, & Minimally Invasive Surgery [phone] [facility]

## 2017-11-11 DIAGNOSIS — N183 Chronic kidney disease, stage 3 (moderate): Secondary | ICD-10-CM

## 2017-11-11 DIAGNOSIS — Z8673 Personal history of transient ischemic attack (TIA), and cerebral infarction without residual deficits: Secondary | ICD-10-CM

## 2017-11-11 DIAGNOSIS — I25810 Atherosclerosis of coronary artery bypass graft(s) without angina pectoris: Secondary | ICD-10-CM

## 2017-11-11 DIAGNOSIS — E1122 Type 2 diabetes mellitus with diabetic chronic kidney disease: Secondary | ICD-10-CM

## 2017-11-11 DIAGNOSIS — K3580 Unspecified acute appendicitis: Principal | ICD-10-CM

## 2017-11-11 DIAGNOSIS — I5032 Chronic diastolic (congestive) heart failure: Secondary | ICD-10-CM

## 2017-11-11 LAB — GLUCOSE, CAPILLARY: Glucose-Capillary: 89 mg/dL (ref 70–99)

## 2017-11-11 LAB — BASIC METABOLIC PANEL
Anion gap: 10 (ref 5–15)
BUN: 13 mg/dL (ref 8–23)
CO2: 25 mmol/L (ref 22–32)
Calcium: 9.2 mg/dL (ref 8.9–10.3)
Chloride: 104 mmol/L (ref 98–111)
Creatinine, Ser: 1.38 mg/dL — ABNORMAL HIGH (ref 0.61–1.24)
GFR calc Af Amer: 57 mL/min — ABNORMAL LOW (ref >60)
GFR calc non Af Amer: 49 mL/min — ABNORMAL LOW (ref >60)
Glucose, Bld: 95 mg/dL (ref 70–99)
Potassium: 4.1 mmol/L (ref 3.5–5.1)
Sodium: 139 mmol/L (ref 135–145)

## 2017-11-11 NOTE — Progress Notes (Signed)
Patient ID: [name], male   DOB: 08-07-1945, 72 y.o.   MRN: [id number]   Acute Care Surgery Service Progress Note:    Chief Complaint/Subjective: Feels better Less pain Tol clears Wife & him want to go home today  Objective: Vital signs in last 24 hours: Temp:  [97.5 F (36.4 C)-98.2 F (36.8 C)] 98.2 F (36.8 C) (08/03 0548) Pulse Rate:  [62-73] 73 (08/03 0548) Resp:  [16-20] 20 (08/03 0548) BP: (123-155)/(67-87) 155/87 (08/03 0548) SpO2:  [93 %-100 %] 93 % (08/03 0548) Last BM Date: 11/10/17  Intake/Output from previous day: 08/02 0701 - 08/03 0700 In: 1531.8 [P.O.:1320; IV Piggyback:211.8] Out: -  Intake/Output this shift: Total I/O In: 520 [P.O.:520] Out: -   Lungs: cta, nonlabored  Cardiovascular: reg  Abd: soft, nontender; no rebound/guarding/peritonitis  Extremities: no edema, +SCDs  Neuro: alert, nonfocal  Lab Results: CBC  Recent Labs    11/08/17 1700 11/09/17 0442  WBC 12.4* 9.1  HGB 12.4* 11.3*  HCT 37.8* 34.1*  PLT 251 215   BMET Recent Labs    11/09/17 0442 11/11/17 0554  NA 140 139  K 4.0 4.1  CL 105 104  CO2 27 25  GLUCOSE 92 95  BUN 23 13  CREATININE 1.39* 1.38*  CALCIUM 8.8* 9.2   LFT Hepatic Function Latest Ref Rng & Units 11/08/2017 03/29/2017 10/24/2016  Total Protein 6.5 - 8.1 g/dL 6.9 6.4(L) 6.8  Albumin 3.5 - 5.0 g/dL 3.4(L) 3.2(L) 4.1  AST 15 - 41 U/L _0 ALT 0 - 44 U/L 14 16(L) 22  Alk Phosphatase 38 - 126 U/L 35(L) 37(L) 39  Total Bilirubin 0.3 - 1.2 mg/dL 0.7 0.7 0.3   PT/INR No results for input(s): LABPROT, INR in the last 72 hours. ABG No results for input(s): PHART, HCO3 in the last 72 hours.  Invalid input(s): PCO2, PO2  Studies/Results:  Anti-infectives: Anti-infectives (From admission, onward)   Start     Dose/Rate Route Frequency Ordered Stop   11/09/17 0200  piperacillin-tazobactam (ZOSYN) IVPB 3.375 g     3.375 g 12.5 mL/hr over 240 Minutes Intravenous Every 8 hours 11/08/17 1821     11/08/17 1830  piperacillin-tazobactam (ZOSYN) IVPB 3.375 g     3.375 g 100 mL/hr over 30 Minutes Intravenous  Once 11/08/17 1821 11/08/17 2031      Medications: Scheduled Meds: . atorvastatin  20 mg Oral q1800  . carvedilol  25 mg Oral BID WC  . chlorhexidine  15 mL Mouth Rinse BID  . DULoxetine  60 mg Oral QHS  . heparin injection (subcutaneous)  5,000 Units Subcutaneous Q8H  . isosorbide mononitrate  60 mg Oral BID  . mouth rinse  15 mL Mouth Rinse q12n4p  . ranolazine  1,000 mg Oral BID   Continuous Infusions: . piperacillin-tazobactam (ZOSYN)  IV 3.375 g (11/11/17 0609)   PRN Meds:.acetaminophen **OR** acetaminophen, ALPRAZolam, morphine injection, ondansetron **OR** ondansetron (ZOFRAN) IV  Assessment/Plan: Patient Active Problem List   Diagnosis Date Noted  . Acute appendicitis 11/08/2017  . Supplemental oxygen dependent 11/07/2017  . MCI (mild cognitive impairment) with memory loss 11/07/2017  . Sleep-related hypoxia 06/26/2017  . Chronic diastolic CHF (congestive heart failure) ([facility]) 06/26/2017  . Stroke-like symptoms 03/30/2017  . CKD stage 3 due to type 2 diabetes mellitus ([facility])   . Benign essential HTN   . Coronary artery disease involving native coronary artery of native heart without angina pectoris   . TIA (transient ischemic attack) 03/29/2017  .  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 History of completed stroke 10/24/2016  . Coronary artery disease involving coronary bypass graft with unspecified angina pectoris 05/21/2014  . Sleep related hypoventilation/hypoxemia in other disease 01/15/2014  . Primary snoring 12/09/2013  . Hypersomnia with sleep apnea, unspecified 12/09/2013  . CAD (coronary artery disease) of artery bypass graft 12/09/2013   DM HTN HLD CAD CKD III PMH CVA in 2018 PMH MI >15 years ago on plavix  Uncomplicated acute appendicitis  -  No appendicolith on CT, abdominal exam relatively benign  -   Continue IV abx  -  Adv diet today.  will continue with medical mgmt for now.   FEN: IVF, start clear liquid diet today  ID: Zosyn 7/31 >>.  Most likely cont ABx at d/c for 7 days total (can switch to Augmentin at d/c) VTE: SCD's,start subcu heparin  Foley: none   D/C patient from hospital when patient meets criteria (anticipate in 1 day(s)):  Tolerating oral intake well Ambulating well Adequate pain control without IV medications Urinating  Having flatus Disposition planning in place  I updated the patient's status to the patient and spouse.  Recommendations were made.  Questions were answered.  Wife frustrated & wanted him to go home today.  I explained pt just started clears yeaterday & go slowly for appendicitis Tx'd nonop.   He expressed understanding & appreciation.     LOS: 3 days    Adin Hector, MD, FACS, MASCRS Gastrointestinal and Minimally Invasive Surgery    [address] [place], [place] [address] [phone] Main / Paging [phone] Fax

## 2017-11-11 NOTE — Progress Notes (Signed)
PROGRESS NOTE    [name]  ZOX:[id number] DOB: Jun 27, 1945 DOA: 11/08/2017 PCP: [name], MD    Brief Narrative:  72 year old male with history of coronary artery disease status post CABG in 26, history of CVA and left carotid endarterectomy, CKD stage III presented to the ED with 4 days of right lower quadrant abdominal pain with CT abdomen done as outpatient concerning for acute appendicitis. Surgery consulted, recommended medical management with IV antibiotics and advance diet as tolerated.     Assessment & Plan:   Principal Problem:   Acute appendicitis Active Problems:   CAD (coronary artery disease) of artery bypass graft   History of completed stroke   CKD stage 3 due to type 2 diabetes mellitus (HCC)   Chronic diastolic CHF (congestive heart failure) (HCC)   Acute appendicitis: Medical management with IV fluids, iV zosyn, and pain control.   Afebrile , leukocytosis resolved.   Hypertension:  Sub optimal.  Restarted home meds of imdur, coreg.   Stage 3 CKD: Creatinine at baseline.    Chronic diastolic heart failure: He appears to be compensated.    H/O CAD: No chest pain or sob. Restart plavix,on discharge.  Resume coreg, imdur, and lipitor.     DVT prophylaxis: heparin sq.   Code Status: full code.  Family Communication: wife at bedside.  Disposition Plan: pending clinical improvement, possible d.c in am.   Consultants:   Surgery.    Procedures: none.    Antimicrobials: zosyn   Subjective: No nausea, vomiting or abdominal pain. No chest pain or sob.   Objective: Vitals:   11/10/17 0538 11/10/17 1307 11/10/17 2028 11/11/17 0548  BP: 133/66 123/67 (!) 144/72 (!) 155/87  Pulse: 70 62 67 73  Resp: 18 16 20 20   Temp: 98.4 F (36.9 C) (!) 97.5 F (36.4 C) 98 F (36.7 C) 98.2 F (36.8 C)  TempSrc: Oral Oral Oral Oral  SpO2: 97% 100% 98% 93%  Weight:      Height:        Intake/Output Summary (Last 24 hours) at 11/11/2017  1437 Last data filed at 11/11/2017 1017 Gross per 24 hour  Intake 1451.75 ml  Output -  Net 1451.75 ml   Filed Weights   11/08/17 1642 11/10/17 0500  Weight: 99.1 kg (218 lb 8 oz) 100 kg (220 lb 7.4 oz)    Examination:  General exam: Appears calm and comfortable  Respiratory system: Clear to auscultation. Respiratory effort normal. Cardiovascular system: S1 & S2 heard, RRR. No JVD, No pedal edema. Gastrointestinal system: Abdomen is nondistended, soft and nontender. No organomegaly or masses felt. Normal bowel sounds heard. Central nervous system: Alert and oriented. No focal neurological deficits. Extremities: Symmetric 5 x 5 power. Skin: No rashes, lesions or ulcers Psychiatry:. Mood & affect appropriate.     Data Reviewed: I have personally reviewed following labs and imaging studies  CBC: Recent Labs  Lab 11/08/17 1700 11/09/17 0442  WBC 12.4* 9.1  NEUTROABS  --  5.7  HGB 12.4* 11.3*  HCT 37.8* 34.1*  MCV 94.5 94.5  PLT 251 215   Basic Metabolic Panel: Recent Labs  Lab 11/08/17 1700 11/09/17 0442 11/11/17 0554  NA 138 140 139  K 4.2 4.0 4.1  CL 104 105 104  CO2 26 27 25   GLUCOSE 101* 92 95  BUN 25* 23 13  CREATININE 1.47* 1.39* 1.38*  CALCIUM 9.3 8.8* 9.2   GFR: Estimated Creatinine Clearance: 54.7 mL/min (A) (by C-G formula based on SCr  of 1.38 mg/dL (H)). Liver Function Tests: Recent Labs  Lab 11/08/17 1700  AST 21  ALT 14  ALKPHOS 35*  BILITOT 0.7  PROT 6.9  ALBUMIN 3.4*   Recent Labs  Lab 11/08/17 1700  LIPASE 28   No results for input(s): AMMONIA in the last 168 hours. Coagulation Profile: No results for input(s): INR, PROTIME in the last 168 hours. Cardiac Enzymes: No results for input(s): CKTOTAL, CKMB, CKMBINDEX, TROPONINI in the last 168 hours. BNP (last 3 results) No results for input(s): PROBNP in the last 8760 hours. HbA1C: No results for input(s): HGBA1C in the last 72 hours. CBG: Recent Labs  Lab 11/09/17 0808  11/10/17 0825 11/10/17 0910 11/11/17 0806  GLUCAP 85 65* 114* 89   Lipid Profile: No results for input(s): CHOL, HDL, LDLCALC, TRIG, CHOLHDL, LDLDIRECT in the last 72 hours. Thyroid Function Tests: No results for input(s): TSH, T4TOTAL, FREET4, T3FREE, THYROIDAB in the last 72 hours. Anemia Panel: No results for input(s): VITAMINB12, FOLATE, FERRITIN, TIBC, IRON, RETICCTPCT in the last 72 hours. Sepsis Labs: No results for input(s): PROCALCITON, LATICACIDVEN in the last 168 hours.  No results found for this or any previous visit (from the past 240 hour(s)).       Radiology Studies: No results found.      Scheduled Meds: . atorvastatin  20 mg Oral q1800  . carvedilol  25 mg Oral BID WC  . chlorhexidine  15 mL Mouth Rinse BID  . DULoxetine  60 mg Oral QHS  . heparin injection (subcutaneous)  5,000 Units Subcutaneous Q8H  . isosorbide mononitrate  60 mg Oral BID  . mouth rinse  15 mL Mouth Rinse q12n4p  . ranolazine  1,000 mg Oral BID   Continuous Infusions: . piperacillin-tazobactam (ZOSYN)  IV 3.375 g (11/11/17 1404)     LOS: 3 days    Time spent: 35 minutes.     Kathlen ModyVijaya Felesia Stahlecker, MD [facility] Pager [phone]   If 7PM-7AM, please contact night-coverage [email or web address] Password [email or web address] 11/11/2017, 2:37 PM

## 2017-11-12 DIAGNOSIS — I10 Essential (primary) hypertension: Secondary | ICD-10-CM

## 2017-11-12 DIAGNOSIS — K589 Irritable bowel syndrome without diarrhea: Secondary | ICD-10-CM

## 2017-11-12 MED ORDER — METRONIDAZOLE 500 MG PO TABS: 500.0000 mg | ORAL_TABLET | Freq: Two times a day (BID) | ORAL | 1 refills | Status: DC

## 2017-11-12 MED ORDER — LINACLOTIDE 145 MCG PO CAPS
145.0000 ug | ORAL_CAPSULE | Freq: Every day | ORAL | Status: DC
  Filled 2017-11-12: qty 1

## 2017-11-12 MED ORDER — CIPROFLOXACIN HCL 500 MG PO TABS: 500.0000 mg | ORAL_TABLET | Freq: Two times a day (BID) | ORAL | 1 refills | Status: DC

## 2017-11-12 NOTE — Evaluation (Signed)
Physical Therapy Evaluation Patient Details Name: [name] MRN: [id number] DOB: 05-07-1945 Today's Date: 11/12/2017   History of Present Illness  [name] is an 72 y.o. male with hx of HTN, CKD III, CAD s/p CABG 33yr ago on Plavix (for CABG and "severe disease in his heart vessels) presented to ED today after being seen by his urologist. He had a CT renal stone study ordered which demonstrated findings consistent with acute uncomplicated appendicitis and he was told to report to ED.  Describes pain as sharp/crampy isolated to RLQ, mild.  Clinical Impression   Patient evaluated by Physical Therapy with no further acute PT needs identified. All education has been completed and the patient has no further questions. We discussed adjusting RW height for better posture, and possible Outpatient follow up for gait and posture (he did not seem overly interested);  See below for any follow-up Physical Therapy or equipment needs. PT is signing off. Thank you for this referral.     Follow Up Recommendations Outpatient PT;Other (comment)(We discussed; pt seemed uninterested)    Equipment Recommendations  None recommended by PT    Recommendations for Other Services       Precautions / Restrictions Precautions Precautions: None      Mobility  Bed Mobility               General bed mobility comments: Recieved pt in chair; reported no difficulty getting out of bed, and he anticipates it will be even easier to get out of his own bed at home  Transfers Overall transfer level: Needs assistance Equipment used: 4-wheeled walker Transfers: Sit to/from Stand Sit to Stand: Supervision;Modified independent (Device/Increase time)         General transfer comment: Supervision inititally for safety as he tends to stand with hands on his 4 wheeled RW and with his 4 wheeled RW unlocked, taking advantage of forward momentum for successful standing; this is clearly his  habit  Ambulation/Gait Ambulation/Gait assistance: Supervision;Modified independent (Device/Increase time) Gait Distance (Feet): 200 Feet(x2 with seated rest break) Assistive device: 4-wheeled walker Gait Pattern/deviations: Step-through pattern;Decreased step length - right;Decreased step length - left;Decreased stride length;Trunk flexed     General Gait Details: Noted bil knees and hips are slightly flexed throughout gait cycle; discussed possibility of adjusting arm height on 4 wheeled RW, and pt declined  [name] (Stroke Patients Only)       Balance Overall balance assessment: Mild deficits observed, not formally tested                                           Pertinent Vitals/Pain Pain Assessment: Faces Faces Pain Scale: Hurts little more Pain Location: bil knees with amb Pain Descriptors / Indicators: Aching Pain Intervention(s): Monitored during session    Home Living Family/patient expects to be discharged to:: Private residence Living Arrangements: Spouse/significant other Available Help at Discharge: Family Type of Home: Apartment Home Access: Level entry;Elevator     Home Layout: One level Home Equipment: WEnvironmental consultant- 4 wheels;Shower seat      Prior Function Level of Independence: Independent with assistive device(s)         Comments: 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     Hand Dominance  Extremity/Trunk Assessment   Upper Extremity Assessment Upper Extremity Assessment: Overall WFL for tasks assessed    Lower Extremity Assessment Lower Extremity Assessment: (Pt reports chronic bil knee pain from OA)       Communication   Communication: HOH  Cognition Arousal/Alertness: Awake/alert Behavior During Therapy: WFL for tasks assessed/performed Overall Cognitive Status: Within Functional Limits for tasks assessed                                         General Comments      Exercises     Assessment/Plan    PT Assessment All further PT needs can be met in the next venue of care  PT Problem List Decreased strength;Pain;Other (comment)(suboptimal posture)       PT Treatment Interventions      PT Goals (Current goals can be found in the Care Plan section)  Acute Rehab PT Goals Patient Stated Goal: REALLY wants to get home soon PT Goal Formulation: All assessment and education complete, DC therapy    Frequency     Barriers to discharge        Co-evaluation               AM-PAC PT "6 Clicks" Daily Activity  Outcome Measure Difficulty turning over in bed (including adjusting bedclothes, sheets and blankets)?: None Difficulty moving from lying on back to sitting on the side of the bed? : None Difficulty sitting down on and standing up from a chair with arms (e.g., wheelchair, bedside commode, etc,.)?: A Little Help needed moving to and from a bed to chair (including a wheelchair)?: None Help needed walking in hospital room?: None Help needed climbing 3-5 steps with a railing? : A Little 6 Click Score: 22    End of Session Equipment Utilized During Treatment: Gait belt Activity Tolerance: Patient tolerated treatment well Patient left: in chair;with call bell/phone within reach;Other (comment)(Dr. Karleen Hampshire having just arrived) Nurse Communication: Mobility status PT Visit Diagnosis: Other abnormalities of gait and mobility (R26.89)    Time: 4037-5436 PT Time Calculation (min) (ACUTE ONLY): 12 min   Charges:   PT Evaluation $PT Eval Low Complexity: [place], PT  Acute Rehabilitation Services Pager [phone] Office [phone]   [place] 11/12/2017, 1:23 PM

## 2017-11-12 NOTE — Progress Notes (Signed)
Pt ready for DC, wife at bedside.

## 2017-11-12 NOTE — Progress Notes (Addendum)
Patient ID: [name], male   DOB: 12/16/1945, 72 y.o.   MRN: [id number]   Acute Care Surgery Service Progress Note:    Chief Complaint/Subjective: Feels much better. Denies abdominal pain Tolerated soft diet. Wife & him want to go home today  Objective: Vital signs in last 24 hours: Temp:  [98.6 F (37 C)-99.3 F (37.4 C)] 98.6 F (37 C) (08/04 0436) Pulse Rate:  [66-91] 66 (08/04 0436) Resp:  [16-18] 18 (08/04 0436) BP: (117-143)/(47-78) 143/72 (08/04 0436) SpO2:  [94 %-99 %] 96 % (08/04 0436) Last BM Date: 11/11/17  Intake/Output from previous day: 08/03 0701 - 08/04 0700 In: 690 [P.O.:640; IV Piggyback:50] Out: -  Intake/Output this shift: No intake/output data recorded.  General: Pt awake/alert/oriented x4 in no major acute distress.  Smiling.  Shakes my hand. Eyes: PERRL, normal EOM. Sclera nonicteric Neuro: CN II-XII intact w/o focal sensory/motor deficits. Lymph: No head/neck/groin lymphadenopathy Psych:  No delerium/psychosis/paranoia HENT: Normocephalic, Mucus membranes moist.  No thrush.  Hard of hearing but corrected with hearing aids. Neck: Supple, No tracheal deviation Chest: No pain.  Good respiratory excursion. CV:  Pulses intact.  Regular rhythm MS: Normal AROM mjr joints.  No obvious deformity Abdomen: Obese.  Soft, Nondistended.  Nontender.  No incarcerated hernias. Ext:  SCDs BLE.  No significant edema.  No cyanosis Skin: No petechiae / purpura   Lab Results: CBC  No results for input(s): WBC, HGB, HCT, PLT in the last 72 hours. BMET Recent Labs    11/11/17 0554  NA 139  K 4.1  CL 104  CO2 25  GLUCOSE 95  BUN 13  CREATININE 1.38*  CALCIUM 9.2   LFT Hepatic Function Latest Ref Rng & Units 11/08/2017 03/29/2017 10/24/2016  Total Protein 6.5 - 8.1 g/dL 6.9 6.4(L) 6.8  Albumin 3.5 - 5.0 g/dL 3.4(L) 3.2(L) 4.1  AST 15 - 41 U/L 21 24 23  ALT 0 - 44 U/L 14 16(L) 22  Alk Phosphatase 38 - 126 U/L 35(L) 37(L) 39  Total Bilirubin 0.3 - 1.2  mg/dL 0.7 0.7 0.3   PT/INR No results for input(s): LABPROT, INR in the last 72 hours. ABG No results for input(s): PHART, HCO3 in the last 72 hours.  Invalid input(s): PCO2, PO2  Studies/Results:  Anti-infectives: Anti-infectives (From admission, onward)   Start     Dose/Rate Route Frequency Ordered Stop   11/09/17 0200  piperacillin-tazobactam (ZOSYN) IVPB 3.375 g     3.375 g 12.5 mL/hr over 240 Minutes Intravenous Every 8 hours 11/08/17 1821     11/08/17 1830  piperacillin-tazobactam (ZOSYN) IVPB 3.375 g     3.375 g 100 mL/hr over 30 Minutes Intravenous  Once 11/08/17 1821 11/08/17 2031      Medications: Scheduled Meds: . atorvastatin  20 mg Oral q1800  . carvedilol  25 mg Oral BID WC  . chlorhexidine  15 mL Mouth Rinse BID  . DULoxetine  60 mg Oral QHS  . heparin injection (subcutaneous)  5,000 Units Subcutaneous Q8H  . isosorbide mononitrate  60 mg Oral BID  . mouth rinse  15 mL Mouth Rinse q12n4p  . ranolazine  1,000 mg Oral BID   Continuous Infusions: . piperacillin-tazobactam (ZOSYN)  IV 3.375 g (11/12/17 0515)   PRN Meds:.acetaminophen **OR** acetaminophen, ALPRAZolam, morphine injection, ondansetron **OR** ondansetron (ZOFRAN) IV   Patient Active Problem List   Diagnosis Date Noted  . Acute appendicitis 11/08/2017  . Supplemental oxygen dependent 11/07/2017  . MCI (mild cognitive impairment) with memory   loss 11/07/2017  . Sleep-related hypoxia 06/26/2017  . Chronic diastolic CHF (congestive heart failure) ([facility]) 06/26/2017  . Stroke-like symptoms 03/30/2017  . CKD stage 3 due to type 2 diabetes mellitus ([facility])   . Benign essential HTN   . Coronary artery disease involving native coronary artery of native heart without angina pectoris   . TIA (transient ischemic attack) 03/29/2017  . 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 History of completed stroke 10/24/2016  . Coronary artery disease involving coronary bypass graft with  unspecified angina pectoris 05/21/2014  . Sleep related hypoventilation/hypoxemia in other disease 01/15/2014  . Primary snoring 12/09/2013  . Hypersomnia with sleep apnea, unspecified 12/09/2013  . CAD (coronary artery disease) of artery bypass graft 12/09/2013   DM HTN HLD CAD CKD III PMH CVA in 2018 PMH MI >15 years ago on plavix  Assessment/Plan:  Uncomplicated acute appendicitis  -  No appendicolith on CT, abdominal exam benign  -  IV abx  -  Coninue with medical mgmt for now since operative risks are increased with his other health issues.. The patient is stable.  There is no evidence of peritonitis, acute abdomen, nor shock.  There is no strong evidence of failure of improvement nor decline with current non-operative management.  There is no need for surgery at the present moment.    From surgery standpoint, okay to discharge home with oral Abx for 7 more days follow-up with surgery as needed.    If develops recurrent pain or symptoms, repeat CT scan to rule out abscess.  May require surgery for recurrent appendicitis, but trying to manage these like diverticulitis with IV antibiotics only.  Fortunately, he improved quickly with just antibiotics only.  No fecalith nor prior history, so hopefully he can resolve and not recur with just antibiotics.  I updated the patient's status to the patient and spouse.  Recommendations were made.  Questions were answered.  They expressed understanding & appreciation.     LOS: 4 days     C. , MD, FACS, MASCRS Gastrointestinal and Minimally Invasive Surgery    [address] [place], [place] [address] [phone] Main / Paging [phone] Fax   

## 2017-11-12 NOTE — Discharge Instructions (Signed)
Appendicitis The appendix is a tube that is shaped like a finger. It is connected to the large intestine. Appendicitis means that this tube is swollen (inflamed). Without treatment, the tube can tear (rupture). This can lead to a life-threatening infection. It can also cause you to have sores (abscesses). These sores hurt. What are the causes? This condition may be caused by something that blocks the appendix, such as:  A ball of poop (stool).  Lymph glands that are bigger than normal.  Sometimes, the cause is not known. What are the signs or symptoms? Symptoms of this condition include:  Pain around the belly button (navel). ? The pain moves toward the lower right belly (abdomen). ? The pain can get worse with time. ? The pain can get worse if you cough. ? The pain can get worse if you move suddenly.  Tenderness in the lower right belly.  Feeling sick to your stomach (nauseous).  Throwing up (vomiting).  Not feeling hungry (loss of appetite).  A fever.  Having a hard time pooping (constipation).  Watery poop (diarrhea).  Not feeling well.  How is this treated? Usually, this condition is treated by taking out the appendix (appendectomy). There are two ways that the appendix can be taken out:  Open surgery. In this surgery, the appendix is taken out through a large cut (incision). The cut is made in the lower right belly. This surgery may be picked if: ? You have scars from another surgery. ? You have a bleeding condition. ? You are pregnant and will be having your baby soon. ? You have a condition that does not allow the other type of surgery.  Laparoscopic surgery. In this surgery, the appendix is taken out through small cuts. Often, this surgery: ? Causes less pain. ? Causes fewer problems. ? Is easier to heal from.  If your appendix tears and a sore forms:  A drain may be put into the sore. The drain will be used to get rid of fluid.  You may get an antibiotic  medicine through an IV tube.  Your appendix may or may not need to be taken out.  This information is not intended to replace advice given to you by your health care provider. Make sure you discuss any questions you have with your health care provider. Document Released: 06/20/2011 Document Revised: 09/03/2015 Document Reviewed: 08/13/2014 Elsevier Interactive Patient Education  2018 [employer]   GETTING TO GOOD BOWEL HEALTH.  ######################################################################  EAT Gradually transition to a high fiber diet with a fiber supplement over the next few weeks after discharge.  Start with a pureed / full liquid diet (see below)  WALK Walk an hour a day.  Control your pain to do that.    HAVE A BOWEL MOVEMENT DAILY Keep your bowels regular to avoid problems.  OK to try a laxative to override constipation.  OK to use an antidairrheal to slow down diarrhea.  Call if not better after 2 tries  CALL IF YOU HAVE PROBLEMS/CONCERNS Call if you are still struggling despite following these instructions. Call if you have concerns not answered by these instructions  ######################################################################   Irregular bowel habits such as constipation and diarrhea can lead to many problems over time.  Having one soft bowel movement a day is the most important way to prevent further problems.  The anorectal canal is designed to handle stretching and feces to safely manage our ability to get rid of solid waste (feces, poop, stool) out of  our body.  BUT, hard constipated stools can act like ripping concrete bricks and diarrhea can be a burning fire to this very sensitive area of our body, causing inflamed hemorrhoids, anal fissures, increasing risk is perirectal abscesses, abdominal pain/bloating, an making irritable bowel worse.      The goal: ONE SOFT BOWEL MOVEMENT A DAY!  To have soft, regular bowel movements:   Drink plenty of  fluids, consider 4-6 tall glasses of water a day.    Take plenty of fiber.  Fiber is the undigested part of plant food that passes into the colon, acting s natures broom to encourage bowel motility and movement.  Fiber can absorb and hold large amounts of water. This results in a larger, bulkier stool, which is soft and easier to pass. Work gradually over several weeks up to 6 servings a day of fiber (25g a day even more if needed) in the form of: o Vegetables -- Root (potatoes, carrots, turnips), leafy green (lettuce, salad greens, celery, spinach), or cooked high residue (cabbage, broccoli, etc) o Fruit -- Fresh (unpeeled skin & pulp), Dried (prunes, apricots, cherries, etc ),  or stewed ( applesauce)  o Whole grain breads, pasta, etc (whole wheat)  o Bran cereals   Bulking Agents -- This type of water-retaining fiber generally is easily obtained each day by one of the following:  o Psyllium bran -- The psyllium plant is remarkable because its ground seeds can retain so much water. This product is available as Metamucil, Konsyl, Effersyllium, Per Diem Fiber, or the less expensive generic preparation in drug and health food stores. Although labeled a laxative, it really is not a laxative.  o Methylcellulose -- This is another fiber derived from wood which also retains water. It is available as Citrucel. o Polyethylene Glycol - and artificial fiber commonly called Miralax or Glycolax.  It is helpful for people with gassy or bloated feelings with regular fiber o Flax Seed - a less gassy fiber than psyllium  No reading or other relaxing activity while on the toilet. If bowel movements take longer than 5 minutes, you are too constipated  AVOID CONSTIPATION.  High fiber and water intake usually takes care of this.  Sometimes a laxative is needed to stimulate more frequent bowel movements, but   Laxatives are not a good long-term solution as it can wear the colon out.  They can help jump-start  bowels if constipated, but should be relied on constantly without discussing with your doctor o Osmotics (Milk of Magnesia, Fleets phosphosoda, Magnesium citrate, MiraLax, GoLytely) are safer than  o Stimulants (Senokot, Castor Oil, Dulcolax, Ex Lax)    o Avoid taking laxatives for more than 7 days in a row.   IF SEVERELY CONSTIPATED, try a Bowel Retraining Program: o Do not use laxatives.  o Eat a diet high in roughage, such as bran cereals and leafy vegetables.  o Drink six (6) ounces of prune or apricot juice each morning.  o Eat two (2) large servings of stewed fruit each day.  o Take one (1) heaping tablespoon of a psyllium-based bulking agent twice a day. Use sugar-free sweetener when possible to avoid excessive calories.  o Eat a normal breakfast.  o Set aside 15 minutes after breakfast to sit on the toilet, but do not strain to have a bowel movement.  o If you do not have a bowel movement by the third day, use an enema and repeat the above steps.   Controlling diarrhea o Switch  to liquids and simpler foods for a few days to avoid stressing your intestines further. o Avoid dairy products (especially milk & ice cream) for a short time.  The intestines often can lose the ability to digest lactose when stressed. o Avoid foods that cause gassiness or bloating.  Typical foods include beans and other legumes, cabbage, broccoli, and dairy foods.  Every person has some sensitivity to other foods, so listen to our body and avoid those foods that trigger problems for you. o Adding fiber (Citrucel, Metamucil, psyllium, Miralax) gradually can help thicken stools by absorbing excess fluid and retrain the intestines to act more normally.  Slowly increase the dose over a few weeks.  Too much fiber too soon can backfire and cause cramping & bloating. o Probiotics (such as active yogurt, Align, etc) may help repopulate the intestines and colon with normal bacteria and calm down a sensitive digestive tract.   Most studies show it to be of mild help, though, and such products can be costly. o Medicines: - Bismuth subsalicylate (ex. Kayopectate, Pepto Bismol) every 30 minutes for up to 6 doses can help control diarrhea.  Avoid if pregnant. - Loperamide (Immodium) can slow down diarrhea.  Start with two tablets (4mg  total) first and then try one tablet every 6 hours.  Avoid if you are having fevers or severe pain.  If you are not better or start feeling worse, stop all medicines and call your doctor for advice o Call your doctor if you are getting worse or not better.  Sometimes further testing (cultures, endoscopy, X-ray studies, bloodwork, etc) may be needed to help diagnose and treat the cause of the diarrhea.  TROUBLESHOOTING IRREGULAR BOWELS 1) Avoid extremes of bowel movements (no bad constipation/diarrhea) 2) Miralax 17gm mixed in 8oz. water or juice-daily. May use BID as needed.  3) Gas-x,Phazyme, etc. as needed for gas & bloating.  4) Soft,bland diet. No spicy,greasy,fried foods.  5) Prilosec over-the-counter as needed  6) May hold gluten/wheat products from diet to see if symptoms improve.  7)  May try probiotics (Align, Activa, etc) to help calm the bowels down 7) If symptoms become worse call back immediately.

## 2017-11-14 NOTE — Discharge Summary (Signed)
Physician Discharge Summary  [name] BJY:[id number] DOB: 11/23/1945 DOA: 11/08/2017  PCP: Adrian PrinceSouth, [name], MD  Admit date: 11/08/2017 Discharge date: 11/12/2017  Admitted From: Home.  Disposition: Home.   Recommendations for Outpatient Follow-up:  1. Follow up with PCP in 1-2 weeks 2. Please obtain BMP/CBC in one week Please follow up with surgery as needed if symptoms reoccur.   Discharge Condition: guarded.  CODE STATUS:full code.  Diet recommendation: Heart Healthy   Brief/Interim Summary:  72 year old male with history of coronary artery disease status post CABG in 4896, history of CVA and left carotid endarterectomy, CKD stage III presented to the ED with 4 days of right lower quadrant abdominal pain with CT abdomen done as outpatient concerning for acute appendicitis. Surgery consulted, recommended medical management with IV antibiotics and advance diet as tolerated.     Discharge Diagnoses:  Principal Problem:   Acute appendicitis Active Problems:   Hypersomnia with sleep apnea   CAD (coronary artery disease) of artery bypass graft   History of completed stroke   Amnestic MCI (mild cognitive impairment with memory loss)   CKD stage 3 due to type 2 diabetes mellitus (HCC)   Benign essential HTN   Chronic diastolic CHF (congestive heart failure) (HCC)   Supplemental oxygen dependent   MCI (mild cognitive impairment) with memory loss   IBS (irritable bowel syndrome)  Acute appendicitis: Medical management with IV fluids, iV zosyn, and pain control.  As his symptoms resolved within 24 hours, surgery recommended to continue with 7 days of antibiotics and follow up as needed.  Afebrile , leukocytosis resolved.   Hypertension:  Well controlled.  Restarted home meds of imdur, coreg.   Stage 3 CKD: Creatinine at baseline.    Chronic diastolic heart failure: He appears to be compensated.    H/O CAD: No chest pain or sob. Restart plavix,on discharge.   Resume coreg, imdur, and lipitor.       Discharge Instructions  Discharge Instructions    Diet - low sodium heart healthy   Complete by:  As directed    Discharge instructions   Complete by:  As directed    Follow up with PCP in one week.  Follow up with surgery as needed.     Allergies as of 11/12/2017      Reactions   Penicillins Other (See Comments)   From childhood: Has patient had a PCN reaction causing immediate rash, facial/tongue/throat swelling, SOB or lightheadedness with hypotension: Unk Has patient had a PCN reaction causing severe rash involving mucus membranes or skin necrosis: Unk Has patient had a PCN reaction that required hospitalization: Unk Has patient had a PCN reaction occurring within the last 10 years: No If all of the above answers are "NO", then may proceed with Cephalosporin use.      Medication List    TAKE these medications   ALPRAZolam 0.5 MG tablet Commonly known as:  XANAX Take 1 tablet (0.5 mg total) at bedtime as needed by mouth. What changed:    when to take this  reasons to take this   aspirin EC 81 MG tablet Take 81 mg by mouth at bedtime.   atorvastatin 20 MG tablet Commonly known as:  LIPITOR Take 20 mg by mouth at bedtime.   carvedilol 25 MG tablet Commonly known as:  COREG Take 25 mg by mouth 2 (two) times daily with a meal.   ciprofloxacin 500 MG tablet Commonly known as:  CIPRO Take 1 tablet (500 mg total) by  mouth 2 (two) times daily.   clopidogrel 75 MG tablet Commonly known as:  PLAVIX Take 75 mg by mouth every morning.   DULoxetine 60 MG capsule Commonly known as:  CYMBALTA Take 60 mg by mouth at bedtime.   fenofibrate 160 MG tablet Take 160 mg by mouth daily.   isosorbide mononitrate 30 MG 24 hr tablet Commonly known as:  IMDUR Take 60 mg by mouth 2 (two) times daily.   LINZESS 145 MCG Caps capsule Generic drug:  linaclotide Take 145 mcg daily by mouth.   losartan 25 MG tablet Commonly known  as:  COZAAR Take 0.5 tablets (12.5 mg total) by mouth daily. What changed:  how much to take   metroNIDAZOLE 500 MG tablet Commonly known as:  FLAGYL Take 1 tablet (500 mg total) by mouth 2 (two) times daily.   MULTIVITAMIN PO Take 1 tablet by mouth daily.   nitroGLYCERIN 0.4 MG SL tablet Commonly known as:  NITROSTAT Place 1 tablet (0.4 mg total) under the tongue every 5 (five) minutes as needed for chest pain.   RANEXA 1000 MG SR tablet Generic drug:  ranolazine Take 1,000 mg by mouth 2 (two) times daily.      Follow-up Information    [name], MD. Schedule an appointment as soon as possible for a visit in 1 week(s).   Specialty:  Endocrinology Contact information: [address] [place] [address] [phone]        Chilton Si, MD .   Specialty:  Cardiology Contact information: [address] [place] [address] [phone]        [name], MD Follow up.   Specialty:  General Surgery Why:  as needed.  Contact information: [address] [place] [address] [phone]          Allergies  Allergen Reactions  . Penicillins Other (See Comments)    From childhood: Has patient had a PCN reaction causing immediate rash, facial/tongue/throat swelling, SOB or lightheadedness with hypotension: Unk Has patient had a PCN reaction causing severe rash involving mucus membranes or skin necrosis: Unk Has patient had a PCN reaction that required hospitalization: Unk Has patient had a PCN reaction occurring within the last 10 years: No If all of the above answers are "NO", then may proceed with Cephalosporin use.     Consultations:  Surgery.    Procedures/Studies:  No results found.    Subjective: No chest pain or sob.   Discharge Exam: Vitals:   11/11/17 2214 11/12/17 0436  BP: (!) 117/47 (!) 143/72  Pulse: 91 66  Resp: 18 18  Temp: 99.3 F (37.4 C) 98.6 F (37 C)  SpO2: 99% 96%   Vitals:    11/11/17 0548 11/11/17 2209 11/11/17 2214 11/12/17 0436  BP: (!) 155/87 131/78 (!) 117/47 (!) 143/72  Pulse: 73 68 91 66  Resp: 20 16 18 18   Temp: 98.2 F (36.8 C) 98.6 F (37 C) 99.3 F (37.4 C) 98.6 F (37 C)  TempSrc: Oral Oral Oral Oral  SpO2: 93% 94% 99% 96%  Weight:      Height:        General: Pt is alert, awake, not in acute distress Cardiovascular: RRR, S1/S2 +, no rubs, no gallops Respiratory: CTA bilaterally, no wheezing, no rhonchi Abdominal: Soft, NT, ND, bowel sounds + Extremities: no edema, no cyanosis    The results of significant diagnostics from this hospitalization (including imaging, microbiology, ancillary and laboratory) are listed below for  reference.     Microbiology: No results found for this or any previous visit (from the past 240 hour(s)).   Labs: BNP (last 3 results) No results for input(s): BNP in the last 8760 hours. Basic Metabolic Panel: Recent Labs  Lab 11/08/17 1700 11/09/17 0442 11/11/17 0554  NA 138 140 139  K 4.2 4.0 4.1  CL 104 105 104  CO2 26 27 25   GLUCOSE 101* 92 95  BUN 25* 23 13  CREATININE 1.47* 1.39* 1.38*  CALCIUM 9.3 8.8* 9.2   Liver Function Tests: Recent Labs  Lab 11/08/17 1700  AST 21  ALT 14  ALKPHOS 35*  BILITOT 0.7  PROT 6.9  ALBUMIN 3.4*   Recent Labs  Lab 11/08/17 1700  LIPASE 28   No results for input(s): AMMONIA in the last 168 hours. CBC: Recent Labs  Lab 11/08/17 1700 11/09/17 0442  WBC 12.4* 9.1  NEUTROABS  --  5.7  HGB 12.4* 11.3*  HCT 37.8* 34.1*  MCV 94.5 94.5  PLT 251 215   Cardiac Enzymes: No results for input(s): CKTOTAL, CKMB, CKMBINDEX, TROPONINI in the last 168 hours. BNP: Invalid input(s): POCBNP CBG: Recent Labs  Lab 11/09/17 0808 11/10/17 0825 11/10/17 0910 11/11/17 0806  GLUCAP 85 65* 114* 89   D-Dimer No results for input(s): DDIMER in the last 72 hours. Hgb A1c No results for input(s): HGBA1C in the last 72 hours. Lipid Profile No results for  input(s): CHOL, HDL, LDLCALC, TRIG, CHOLHDL, LDLDIRECT in the last 72 hours. Thyroid function studies No results for input(s): TSH, T4TOTAL, T3FREE, THYROIDAB in the last 72 hours.  Invalid input(s): FREET3 Anemia work up No results for input(s): VITAMINB12, FOLATE, FERRITIN, TIBC, IRON, RETICCTPCT in the last 72 hours. Urinalysis    Component Value Date/Time   COLORURINE AMBER (A) 11/08/2017 2052   APPEARANCEUR CLEAR 11/08/2017 2052   LABSPEC 1.019 11/08/2017 2052   PHURINE 5.0 11/08/2017 2052   GLUCOSEU NEGATIVE 11/08/2017 2052   HGBUR NEGATIVE 11/08/2017 2052   BILIRUBINUR NEGATIVE 11/08/2017 2052   KETONESUR NEGATIVE 11/08/2017 2052   PROTEINUR NEGATIVE 11/08/2017 2052   NITRITE NEGATIVE 11/08/2017 2052   LEUKOCYTESUR NEGATIVE 11/08/2017 2052   Sepsis Labs Invalid input(s): PROCALCITONIN,  WBC,  LACTICIDVEN Microbiology No results found for this or any previous visit (from the past 240 hour(s)).   Time coordinating discharge: 35 minutes  SIGNED:   Kathlen Mody, MD  [facility] 11/14/2017, 8:35 AM Pager   If 7PM-7AM, please contact night-coverage [email or web address] Password [email or web address]

## 2018-04-30 DIAGNOSIS — I739 Peripheral vascular disease, unspecified: Secondary | ICD-10-CM | POA: Diagnosis present

## 2018-04-30 NOTE — H&P (Signed)
OFFICE VISIT NOTES COPIED TO EPIC FOR DOCUMENTATION  . History of Present Illness [name]; 04/05/2018 5:01 PM) Patient words: 4-6 week fu with EKG;last OV 02/27/2018.  The patient is a 73 year old male who presents for a Follow-up for Leg claudication.  Additional reasons for visit:  Follow-up for Coronary artery disease is described as the following: [name] is a pleasan Caucasian male with CAD S/P CABG in 2006, mild to moderate LV systolic dysfunction, chronic angina pectoris and left carotid endarterectomy, mild right ICA stenosis and history of possible TIA in August 2018, found to have moderate intracranial disease and PAD with moderate decrease in bilateral ABI, now on dual antiplatelet therapy. Past medical history significant for hypertension, had controlled diabetes, hyperlipidemia, chronic leg edema.  He presents to the office for one-month follow-up of leg edema and also angina pectoris. Continues to have anginal symptoms in spite of being on maximal medical therapy. He would like to proceed with coronary angiography. He does have stage 3 CKD and aware of contrast exposure. Wife present.   Problem List/Past Medical George Page, [name]; 04/05/2018 5:02 PM) Hyperlipemia (E78.5)  Anxiety (F41.9)  Arthritis (M19.90)  H/O myocardial infarction, greater than 8 weeks (I25.2)  had quad bypass and h/o stent Event Monitor  S/P CABG (coronary artery bypass graft) (Z95.1) [1996]: CABG 1996: Coronary angiogram 01/01/13 LIMA to LAD is patent. SVG to D1 occluded. Retrograde filling of a large D1. SVG to OM1 is widely patent. The saphenous vein graft has diffuse graft disease (No change from 2009, 2010 angio) Appendicitis (K37)  Arteriosclerosis of coronary artery bypass graft (I25.810)  Benign essential hypertension (I10)  Diet-controlled diabetes mellitus (E11.9)  Mixed hyperlipidemia due to type 2 diabetes mellitus (E11.69)  Shortness of breath (R06.02)   Angina pectoris (I20.9)  Bilateral carotid bruits (R09.89)  Carotid artery duplex 05/03/2017: Stenosis in the right internal carotid artery (16-49%). Antegrade right vertebral artery flow. Antegrade left vertebral artery flow. Follow up in one year is appropriate if clinically indicated. Compared to the study done on 12/24/2015, minimal progression on the right ICA stenosis. Laboratory examination (Z01.89)  Labs 03/30/2017: HB 5.8%, HB 11.4/HCT 32.9, platelets 335, normal indicis, BUN 19, creatinine 1.29, EGFR 53 mL, potassium 3.9, total cholesterol 150, triglycerides 208, HDL 34, LDL 74. Labs 11/24/2015: Magnesium 1.7, HB 14.2/HCT 41.8, nitrates 221. HbA1c 5.47. Total cholesterol 137, triglycerides 100, HDL 44, LDL 86. Serum glucose 87 mg, BUN 19, serum creatinine 1.0, CMP normal. Labs 11/13/2014: Total cholesterol 183, triglycerides 169, HDL 42, LDL 107, LDL particle # 1431, LP-IR score 64, vitamin D 47.6. HbA1c 6.0%, serum glucose 118, creatinine 1.04, potassium 5.1, CMP otherwise normal, CBC normal. Aortic stenosis, mild (I35.0)  Echocardiogram 12/06/2016: Left ventricle cavity is normal in size. Moderate concentric hypertrophy of the left ventricle. Normal global wall motion. Doppler evidence of grade I (impaired) diastolic dysfunction. Calculated EF 57%. Left atrial cavity is moderately dilated at 4.5 cm. Trace aortic regurgitation. Moderate calcification of the aortic valve annulus. Mild aortic valve leaflet calcification. Mildly restricted aortic valve leaflets. Mild aortic valve stenosis. Aortic valve peak pressure gradient of 30 and mean gradient of 17.5 mmHg, calculated aortic valve area 1.45 cm. Mild (Grade I) mitral regurgitation. Trace tricuspid regurgitation. Unable to estimate PA pressure due to absence/minimal TR signal. Compared to report from Echocardiogram 05/31/2012, no significant change. Grade 2 diastolic dysfunction. Claudication, class III (I73.9)  Lower extremity arterial duplex  02/14/2017: Monophasic waveform throught out the right lower extremity  suggests inflow (Common iliac artery) stenosis or occlusion. Monophasic waveform in the proximal left SFA and below suggests near occlusion of the left femoral artery. This exam reveals moderately decreased perfusion of the right lower extremity with RABI 0.75, noted at the post tibial artery level (ABI), moderately decreased perfusion of the left lower extremity with LABI 0.50, noted at the dorsalis pedis and post tibial artery level (ABI). No prior studies to compare. Abnormal study consider further evaluation. History of left-sided carotid endarterectomy (U88.280)  Carotid artery duplex 05/03/2017: Stenosis in the right internal carotid artery (16-49%). Antegrade right vertebral artery flow. Antegrade left vertebral artery flow. Follow up in one year is appropriate if clinically indicated. Compared to the study done on 12/24/2015, minimal progression on the right ICA stenosis. Bilateral leg edema (R60.0)  Atherosclerosis of native coronary artery of native heart with angina pectoris (I25.119) [01/01/2013]: EKG 02/27/2018: Sinus rhythm 71 bpm. Normal axis. Normal conduction. Old inferior infarct.  Coronary angiogram 01/01/2013: History of CABG in 2006. Dilated left ventricle, akinesis of the posterobasal segment. EF 40-45%, mild mitral regurgitation. Right dominant circulation, RCA occluded in the proximal circumflex and. SVG to RCA occluded. Diffusely diseased LM, LAD. Cx occluded in its proximal segment. LIMA to LAD is patent. SVG to D1 occluded. Retrograde filling of a large D1. SVG to OM1 is widely patent. The saphenous vein graft has diffuse graft disease (No change from 2009, 2010 angio)  Lexiscan myoview stress test 12/30/2016: 1. Pharmacologic stress testing was performed with intravenous administration of .4 mg of Lexiscan over a 10-15 seconds infusion. Stress symptoms included dyspnea. 2. The resting electrocardiogram  demonstrated normal sinus rhythm, IVCD and no resting arrhythmias. Old inferior infarct seen. Stress EKG is non diagnostic for ischemia as it is a pharmacologic stress. 3. SPECT images and gated SPECT imaging reveal old inferior infarct with no appreciable per infarct ischemia. The left ventricular ejection fraction was calculated at 39% 4. This is an intermediate risk study. No significant change from 02/13/2013. 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 Obstructive sleep apnea, REM-related, to my to suspect, use Cymbalta for REM suppression, continue home oxygen for nocturnal hypoxemia. No obstructive or central sleep apnea H/O arterial ischemic stroke (Z86.73) [11/28/2016]: Event Monitor 30 dayss 01/02/17: NSR. No A. Fibrillation or heart block. MR angiogram of the neck 03/29/2017: No significant carotid stenosis. Chronic ischemic microangiopathy Previously noted oderate narrowing of the terminal right ICA cavernous portion as well as terminal right vertebral artery on 11/28/16 not noted.  Allergies [name]; May 04, 2018 12:13 PM) Penicillin G Potassium *PENICILLINS*  as a child not sure of reaction  Family History [name]; 05/04/2018 12:13 PM) Mother  Deceased. at age 44 from old age, mild MI in her 18's Father  Deceased. at age 14 from heart disease Siblings  4, 1 living sister, no known heart issues  Social History [name]; 2018/05/04 12:13 PM) Current tobacco use  Former smoker. quit in 1985 Non Drinker/No Alcohol Use  Marital status  Married. Number of Children  0. Living Situation  Lives with spouse.  Past Surgical History [name]; May 04, 2018 12:13 PM) Coronary Artery Bypass, Four [1996]: Endarterectomy [2015]: Left Carotid fistula [2000]: wrist [2001]: Left. 49m kidney stone [2011]:  Medication History ([name];  12020-01-241:17 PM) Nitrostat (0.'4MG'$  Tab Sublingual, 1 Tab Sublingua Tab Sublingua Sublingual every 5 minutes as needed for chest pain., Taken starting 03/27/2018) Active. Furosemide ('40MG'$  Tablet, 1 (one) Tablet  Oral three days, Taken starting 03/21/2018) Active. Atorvastatin Calcium ('20MG'$  Tablet, 1 (one) Tablet Oral every evening after dinner, Taken starting 03/20/2018) Active. ALPRAZolam (0.'5MG'$  Tablet, 1 Oral three times daily as needed) Active. Carvedilol ('25MG'$  Tablet, 1 Oral two times daily) Active. Clopidogrel Bisulfate ('75MG'$  Tablet, 1 Oral daily) Active. Fenofibrate ('160MG'$  Tablet, 1 Oral daily) Active. Isosorbide Dinitrate ('30MG'$  Tablet, 2 Oral two times daily) Active. Losartan Potassium ('25MG'$  Tablet, 1 Oral daily) Active. Ranexa ('1000MG'$  Tablet ER 12HR, 1 Oral two times daily) Active. Aspirin ('81MG'$  Tablet, 1 Oral daily) Active. Multivitamins (1 Oral daily) Active. DULoxetine HCl ('60MG'$  Capsule DR Part, 1 Oral daily) Active. Linzess (145MCG Capsule, 1 Oral as needed) Active. Medications Reconciled (verbally with pt; no list or medication present)  Diagnostic Studies History [name]; 04-08-18 12:14 PM) Carotid Doppler  05/03/2017: Stenosis in the right internal carotid artery (16-49%). Antegrade right vertebral artery flow. Antegrade left vertebral artery flow. Follow up in one year is appropriate if clinically indicated. Compared to the study done on 12/24/2015, minimal progression on the right ICA stenosis. Echocardiogram [12/06/2016]: Left ventricle cavity is normal in size. Moderate concentric hypertrophy of the left ventricle. Inferior and inferolateral moderate hypokinesis to akinesis. Doppler evidence of grade I (impaired) diastolic dysfunction. Visual LVEF 40-45%. Left atrial cavity is moderately dilated at 4.5 cm. Trace aortic regurgitation. Moderate calcification of the aortic valve annulus. Mild aortic valve leaflet calcification. Mildly restricted  aortic valve leaflets. Mild aortic valve stenosis. Aortic valve peak pressure gradient of 30 and mean gradient of 17.5 mmHg, calculated aortic valve area 1.45 cm. Mild (Grade I) mitral regurgitation. Trace tricuspid regurgitation. Unable to estimate PA pressure due to absence/minimal TR signal. Compared to report from Echocardiogram 05/31/2012, no significant change. Grade 2 diastolic dysfunction. Event Monitor [01/31/2017]: Event Monitor 30 dayss 01/02/17: NSR. No A. Fibrillation or heart block. Coronary Angiogram [01/01/2013]: Coronary angiogram 01/01/2013: History of CABG in 2006. Dilated left ventricle, akinesis of the posterobasal segment. EF 40-45%, mild mitral regurgitation. Right dominant circulation, RCA occluded in the proximal circumflex and. SVG to RCA occluded. Diffusely diseased LM, LAD. Cx occluded in its proximal segment. LIMA to LAD is patent. SVG to D1 occluded. Retrograde filling of a large D1. SVG to OM1 is widely patent. The saphenous vein graft has diffuse graft disease (No change from 2009, 2010 angio) Nuclear stress test  Lexiscan myoview stress test 12/30/2016: 1. Pharmacologic stress testing was performed with intravenous administration of .4 mg of Lexiscan over a 10-15 seconds infusion. Stress symptoms included dyspnea. 2. The resting electrocardiogram demonstrated normal sinus rhythm, IVCD and no resting arrhythmias. Old inferior infarct seen. Stress EKG is non diagnostic for ischemia as it is a pharmacologic stress. 3. SPECT images and gated SPECT imaging reveal old inferior infarct with no appreciable per infarct ischemia. The left ventricular ejection fraction was calculated at 39% 4. This is an intermediate risk study.    Review of Systems George Page, [name]; 2018-04-08 5:2 PM) General Present- Fatigue. Not Present- Fever and Night Sweats. Skin Not Present- Itching and Rash. HEENT Not Present- Headache. Respiratory Present- Decreased Exercise Tolerance and  Snoring. Not Present- Wakes up from Sleep Wheezing or Short of Breath and Wheezing. Cardiovascular Present- Chest Pain (3-4 anginal episodes per week easily relieved with rest or s/l NTG), Claudications (bilateral calf) and Edema. Not Present- Fainting, Orthopnea and Swelling of Extremities. Gastrointestinal Not Present- Abdominal Pain, Constipation, Diarrhea, Nausea and Vomiting. Musculoskeletal Present- Joint Pain (DJD, bilateral hip and knee). Not Present- Joint Swelling. Neurological Present-  Decreased Memory. Not Present- Headaches. Hematology Not Present- Blood Clots, Easy Bruising and Nose Bleed. All other systems negative  Vitals [name]; 04/05/2018 12:38 PM) 04/05/2018 12:18 PM Weight: 217.25 lb Height: 69in Body Surface Area: 2.14 m Body Mass Index: 32.08 kg/m  Pulse: 65 (Regular)  P.OX: 97% (Room air) BP: 113/54 (Sitting, Right Arm, Standard)  Physical Exam George Page, [name]; 04/05/2018 5:2 PM) General Mental Status-Alert. General Appearance-Cooperative, Appears stated age, Not in acute distress. Orientation-Oriented X3. Build & Nutrition-Well built and Mildly obese.  Head and Neck Thyroid Gland Characteristics - no palpable nodules, no palpable enlargement.  Chest and Lung Exam Chest and lung exam reveals -quiet, even and easy respiratory effort with no use of accessory muscles and on auscultation, normal breath sounds, no adventitious sounds.  Cardiovascular Inspection Jugular vein - Right - No Distention. Auscultation Heart Sounds - S1 WNL, S2 WNL and No gallop present. Murmurs & Other Heart Sounds: Murmur - Location - Aortic Area. Timing - Early systolic. Grade - II/VI. Radiation - Right carotid.  Abdomen Inspection Hernias - Ventral hernia - Reducible. Palpation/Percussion Normal exam - Non Tender and No hepatosplenomegaly. Auscultation Normal exam - Bowel sounds normal.  Peripheral Vascular Lower Extremity Inspection -  Left - No Pigmentation, No Varicose veins. Right - No Pigmentation, No Varicose veins. Palpation - Edema - Bilateral - 2+ Pitting edema. Femoral pulse - Bilateral - Feeble(with prominent bruit). Popliteal pulse - Bilateral - 0+. Dorsalis pedis pulse - Bilateral - Absent. Posterior tibial pulse - Bilateral - Absent. Carotid arteries - Bilateral-Soft Bruit. Abdomen-No prominent abdominal aortic pulsation, No epigastric bruit.  Neurologic Motor-Grossly intact without any focal deficits.  Musculoskeletal Global Assessment Left Lower Extremity - normal range of motion without pain. Right Lower Extremity - normal range of motion without pain.    Assessment & Plan [name]; 04/05/2018 5:04 PM) Atherosclerosis of native coronary artery of native heart with angina pectoris (I25.119) Story: EKG 04/05/2018: Normal sinus rhythm at the rate of 71 bpm, IVCD, borderline criteria for LVH. Inferior infarct old, posterior infarct old. Normal QT interval. No evidence of ischemia. Single PVC. No significant change from EKG 02/27/2018.  Coronary angiogram 01/01/2013: History of CABG in 2006. Dilated left ventricle, akinesis of the posterobasal segment. EF 40-45%, mild mitral regurgitation. Right dominant circulation, RCA occluded in the proximal circumflex and. SVG to RCA occluded. Diffusely diseased LM, LAD. Cx occluded in its proximal segment. LIMA to LAD is patent. SVG to D1 occluded. Retrograde filling of a large D1. SVG to OM1 is widely patent. The saphenous vein graft has diffuse graft disease (No change from 2009, 2010 angio)  Lexiscan myoview stress test 12/30/2016: 1. Pharmacologic stress testing was performed with intravenous administration of .4 mg of Lexiscan over a 10-15 seconds infusion. Stress symptoms included dyspnea. 2. The resting electrocardiogram demonstrated normal sinus rhythm, IVCD and no resting arrhythmias. Old inferior infarct seen. Stress EKG is non diagnostic for  ischemia as it is a pharmacologic stress. 3. SPECT images and gated SPECT imaging reveal old inferior infarct with no appreciable peri-infarct ischemia. The left ventricular ejection fraction was calculated at 39% 4. This is an intermediate risk study. No significant change from 02/13/2013. Current Plans Complete electrocardiogram (93000) Aortic stenosis, mild (I35.0) Story: Echocardiogram 12/06/2016: Left ventricle cavity is normal in size. Moderate concentric hypertrophy of the left ventricle. Normal global wall motion. Doppler evidence of grade I (impaired) diastolic dysfunction. Calculated EF 57%. Left atrial cavity is moderately dilated at 4.5 cm. Trace  aortic regurgitation. Moderate calcification of the aortic valve annulus. Mild aortic valve leaflet calcification. Mildly restricted aortic valve leaflets. Mild aortic valve stenosis. Aortic valve peak pressure gradient of 30 and mean gradient of 17.5 mmHg, calculated aortic valve area 1.45 cm. Mild (Grade I) mitral regurgitation. Trace tricuspid regurgitation. Unable to estimate PA pressure due to absence/minimal TR signal. Compared to report from Echocardiogram 05/31/2012, no significant change. Grade 2 diastolic dysfunction. Claudication, class III (I73.9) Story: Lower extremity arterial duplex 02/14/2017: Monophasic waveform throught out the right lower extremity suggests inflow (Common iliac artery) stenosis or occlusion. Monophasic waveform in the proximal left SFA and below suggests near occlusion of the left femoral artery. This exam reveals moderately decreased perfusion of the right lower extremity with RABI 0.75, noted at the post tibial artery level (ABI), moderately decreased perfusion of the left lower extremity with LABI 0.50, noted at the dorsalis pedis and post tibial artery level (ABI). No prior studies to compare. Abnormal study consider further evaluation. Bilateral leg edema (R60.0) Laboratory examination (Z01.89) Story:  Labs 03/30/2017: HB 5.8%, HB 11.4/HCT 32.9, platelets 335, normal indicis, BUN 19, creatinine 1.29, EGFR 53 mL, potassium 3.9, total cholesterol 150, triglycerides 208, HDL 34, LDL 74.  Labs 11/24/2015: Magnesium 1.7, HB 14.2/HCT 41.8, nitrates 221. HbA1c 5.47. Total cholesterol 137, triglycerides 100, HDL 44, LDL 86. Serum glucose 87 mg, BUN 19, serum creatinine 1.0, CMP normal.  Labs 11/13/2014: Total cholesterol 183, triglycerides 169, HDL 42, LDL 107, LDL particle # 1431, LP-IR score 64, vitamin D 47.6. HbA1c 6.0%, serum glucose 118, creatinine 1.04, potassium 5.1, CMP otherwise normal, CBC normal.  Note:-  Recommendations:  [name] is a pleasan Caucasian male with CAD S/P CABG in 2006, mild to moderate LV systolic dysfunction, chronic angina pectoris and left carotid endarterectomy, mild right ICA stenosis and history of possible TIA in August 2018, found to have moderate intracranial disease and PAD with moderate decrease in bilateral ABI, now on dual antiplatelet therapy. Past medical history significant for hypertension, had controlled diabetes, hyperlipidemia, chronic leg edema.   He presents to the office for one-month follow-up of leg edema and also angina pectoris. Continues to have anginal symptoms in spite of being on maximal medical therapy. He would like to proceed with coronary angiography. He does have stage 3 CKD and aware of contrast exposure. Wife present.  He is also limited his activity significantly and is using a walker due to leg weakness and also arthritis. I'd like to also perform limited peripheral arteriogram to evaluate his peripheral anatomy, he has markedly abnormal lower extremity arterial duplex in the past.  Symptoms or dyspnea on exertion are stable. No clinical evidence of heart failure. His leg edema is fairly chronic and he has been using furosemide on a p.r.n. basis. I'll see him back after the catheterization and make further  recommendations.  I'm also concerned about the long-term health and potential cardio vascular competition that can arise in the near future both in him and his wife for extremely ill with multiple medical comorbidities, we also discussed regarding consideration for moving into an assisted living facility.  CC: Dr. Roque Cash, [name] ; York Pellant, DO Addendum Note([name]; 04/30/2018 11:09 PM) 04/19/2017: Creatinine 1.47, EGFR 46/54, potassium 4.6, BMP otherwise normal. CBC normal.  Signed by George Page, [name] (04/05/2018 5:05 PM)

## 2018-05-01 ENCOUNTER — Other Ambulatory Visit: Payer: Self-pay

## 2018-05-01 ENCOUNTER — Encounter (HOSPITAL_COMMUNITY)
Admission: RE | Disposition: A | Discharge status: Home / Self Care | Payer: Self-pay | Source: Home / Self Care | Attending: Cardiology

## 2018-05-01 ENCOUNTER — Ambulatory Visit (HOSPITAL_COMMUNITY)
Admission: RE | Admit: 2018-05-01 | Discharge: 2018-05-01 | Disposition: A | Discharge status: Home / Self Care | Payer: Medicare Other | Attending: Cardiology | Admitting: Cardiology

## 2018-05-01 DIAGNOSIS — Z951 Presence of aortocoronary bypass graft: Secondary | ICD-10-CM | POA: Insufficient documentation

## 2018-05-01 DIAGNOSIS — R0609 Other forms of dyspnea: Secondary | ICD-10-CM | POA: Insufficient documentation

## 2018-05-01 DIAGNOSIS — I70212 Atherosclerosis of native arteries of extremities with intermittent claudication, left leg: Secondary | ICD-10-CM | POA: Insufficient documentation

## 2018-05-01 DIAGNOSIS — I2584 Coronary atherosclerosis due to calcified coronary lesion: Secondary | ICD-10-CM | POA: Diagnosis not present

## 2018-05-01 DIAGNOSIS — Z7982 Long term (current) use of aspirin: Secondary | ICD-10-CM | POA: Diagnosis not present

## 2018-05-01 DIAGNOSIS — E669 Obesity, unspecified: Secondary | ICD-10-CM | POA: Insufficient documentation

## 2018-05-01 DIAGNOSIS — I5020 Unspecified systolic (congestive) heart failure: Secondary | ICD-10-CM | POA: Insufficient documentation

## 2018-05-01 DIAGNOSIS — E1122 Type 2 diabetes mellitus with diabetic chronic kidney disease: Secondary | ICD-10-CM | POA: Diagnosis not present

## 2018-05-01 DIAGNOSIS — I25119 Atherosclerotic heart disease of native coronary artery with unspecified angina pectoris: Secondary | ICD-10-CM | POA: Diagnosis not present

## 2018-05-01 DIAGNOSIS — E785 Hyperlipidemia, unspecified: Secondary | ICD-10-CM | POA: Diagnosis not present

## 2018-05-01 DIAGNOSIS — I739 Peripheral vascular disease, unspecified: Secondary | ICD-10-CM | POA: Diagnosis present

## 2018-05-01 DIAGNOSIS — M1990 Unspecified osteoarthritis, unspecified site: Secondary | ICD-10-CM | POA: Diagnosis not present

## 2018-05-01 DIAGNOSIS — Z79899 Other long term (current) drug therapy: Secondary | ICD-10-CM | POA: Diagnosis not present

## 2018-05-01 DIAGNOSIS — Z8249 Family history of ischemic heart disease and other diseases of the circulatory system: Secondary | ICD-10-CM | POA: Diagnosis not present

## 2018-05-01 DIAGNOSIS — Z87891 Personal history of nicotine dependence: Secondary | ICD-10-CM | POA: Diagnosis not present

## 2018-05-01 DIAGNOSIS — I252 Old myocardial infarction: Secondary | ICD-10-CM | POA: Insufficient documentation

## 2018-05-01 DIAGNOSIS — N183 Chronic kidney disease, stage 3 (moderate): Secondary | ICD-10-CM | POA: Diagnosis not present

## 2018-05-01 DIAGNOSIS — Z880 Allergy status to penicillin: Secondary | ICD-10-CM | POA: Diagnosis not present

## 2018-05-01 DIAGNOSIS — I130 Hypertensive heart and chronic kidney disease with heart failure and stage 1 through stage 4 chronic kidney disease, or unspecified chronic kidney disease: Secondary | ICD-10-CM | POA: Insufficient documentation

## 2018-05-01 DIAGNOSIS — Z6832 Body mass index (BMI) 32.0-32.9, adult: Secondary | ICD-10-CM | POA: Insufficient documentation

## 2018-05-01 DIAGNOSIS — I25810 Atherosclerosis of coronary artery bypass graft(s) without angina pectoris: Secondary | ICD-10-CM | POA: Diagnosis present

## 2018-05-01 HISTORY — PX: PERIPHERAL VASCULAR ATHERECTOMY: CATH118256

## 2018-05-01 HISTORY — PX: LOWER EXTREMITY ANGIOGRAPHY: CATH118251

## 2018-05-01 HISTORY — PX: CORONARY/GRAFT ANGIOGRAPHY: CATH118237

## 2018-05-01 HISTORY — PX: PERIPHERAL VASCULAR BALLOON ANGIOPLASTY: CATH118281

## 2018-05-01 LAB — POCT ACTIVATED CLOTTING TIME
Activated Clotting Time: 224 seconds
Activated Clotting Time: 230 seconds
Activated Clotting Time: 263 seconds

## 2018-05-01 LAB — GLUCOSE, CAPILLARY
Glucose-Capillary: 84 mg/dL (ref 70–99)
Glucose-Capillary: 110 mg/dL — ABNORMAL HIGH (ref 70–99)

## 2018-05-01 SURGERY — LOWER EXTREMITY ANGIOGRAPHY
Anesthesia: LOCAL

## 2018-05-01 MED ORDER — FENTANYL CITRATE (PF) 100 MCG/2ML IJ SOLN
INTRAMUSCULAR | Status: DC | PRN
  Administered 2018-05-01 (×4): 25 ug via INTRAVENOUS

## 2018-05-01 MED ORDER — HEPARIN SODIUM (PORCINE) 1000 UNIT/ML IJ SOLN
INTRAMUSCULAR | Status: AC
  Filled 2018-05-01: qty 1

## 2018-05-01 MED ORDER — HEPARIN SODIUM (PORCINE) 1000 UNIT/ML IJ SOLN
INTRAMUSCULAR | Status: DC | PRN
  Administered 2018-05-01: 8000 [IU] via INTRAVENOUS
  Administered 2018-05-01: 4000 [IU] via INTRAVENOUS
  Administered 2018-05-01: 2000 [IU] via INTRAVENOUS

## 2018-05-01 MED ORDER — ASPIRIN 81 MG PO CHEW
CHEWABLE_TABLET | ORAL | Status: AC
  Administered 2018-05-01: 81 mg via ORAL
  Filled 2018-05-01: qty 1

## 2018-05-01 MED ORDER — HEPARIN (PORCINE) IN NACL 1000-0.9 UT/500ML-% IV SOLN
INTRAVENOUS | Status: AC
  Filled 2018-05-01: qty 1000

## 2018-05-01 MED ORDER — LIDOCAINE HCL (PF) 1 % IJ SOLN
INTRAMUSCULAR | Status: DC | PRN
  Administered 2018-05-01: 18 mL

## 2018-05-01 MED ORDER — ONDANSETRON HCL 4 MG/2ML IJ SOLN: 4.0000 mg | Freq: Four times a day (QID) | INTRAMUSCULAR | Status: DC | PRN

## 2018-05-01 MED ORDER — IODIXANOL 320 MG/ML IV SOLN
INTRAVENOUS | Status: DC | PRN
  Administered 2018-05-01: 100 mL via INTRAVENOUS

## 2018-05-01 MED ORDER — SODIUM CHLORIDE 0.9 % IV BOLUS
500.0000 mL | Freq: Once | INTRAVENOUS | Status: AC
  Administered 2018-05-01: 06:00:00 via INTRAVENOUS

## 2018-05-01 MED ORDER — ASPIRIN 81 MG PO CHEW
81.0000 mg | CHEWABLE_TABLET | Freq: Once | ORAL | Status: AC
  Administered 2018-05-01: 81 mg via ORAL

## 2018-05-01 MED ORDER — HEPARIN (PORCINE) IN NACL 1000-0.9 UT/500ML-% IV SOLN
INTRAVENOUS | Status: DC | PRN
  Administered 2018-05-01 (×2): 500 mL

## 2018-05-01 MED ORDER — SODIUM CHLORIDE 0.9% FLUSH: 3.0000 mL | Freq: Two times a day (BID) | INTRAVENOUS | Status: DC

## 2018-05-01 MED ORDER — ACETAMINOPHEN 325 MG PO TABS: 650.0000 mg | ORAL_TABLET | ORAL | Status: DC | PRN

## 2018-05-01 MED ORDER — SODIUM CHLORIDE 0.9 % IV SOLN: 250.0000 mL | INTRAVENOUS | Status: DC | PRN

## 2018-05-01 MED ORDER — SODIUM CHLORIDE 0.9 % IV SOLN: INTRAVENOUS | Status: DC

## 2018-05-01 MED ORDER — NITROGLYCERIN 1 MG/10 ML FOR IR/CATH LAB
INTRA_ARTERIAL | Status: AC
  Filled 2018-05-01: qty 10

## 2018-05-01 MED ORDER — LIDOCAINE HCL (PF) 1 % IJ SOLN
INTRAMUSCULAR | Status: AC
  Filled 2018-05-01: qty 30

## 2018-05-01 MED ORDER — FENTANYL CITRATE (PF) 100 MCG/2ML IJ SOLN
INTRAMUSCULAR | Status: AC
  Filled 2018-05-01: qty 2

## 2018-05-01 MED ORDER — SODIUM CHLORIDE 0.9% FLUSH: 3.0000 mL | INTRAVENOUS | Status: DC | PRN

## 2018-05-01 MED ORDER — NITROGLYCERIN 1 MG/10 ML FOR IR/CATH LAB
INTRA_ARTERIAL | Status: DC | PRN
  Administered 2018-05-01 (×2): 100 ug

## 2018-05-01 MED ORDER — MIDAZOLAM HCL 2 MG/2ML IJ SOLN
INTRAMUSCULAR | Status: DC | PRN
  Administered 2018-05-01: 2 mg via INTRAVENOUS

## 2018-05-01 MED ORDER — ONDANSETRON HCL 4 MG/2ML IJ SOLN
INTRAMUSCULAR | Status: AC
  Filled 2018-05-01: qty 2

## 2018-05-01 MED ORDER — SODIUM CHLORIDE 0.9 % IV SOLN: INTRAVENOUS | Status: AC

## 2018-05-01 MED ORDER — HYDRALAZINE HCL 20 MG/ML IJ SOLN: 5.0000 mg | INTRAMUSCULAR | Status: DC | PRN

## 2018-05-01 MED ORDER — MIDAZOLAM HCL 2 MG/2ML IJ SOLN
INTRAMUSCULAR | Status: AC
  Filled 2018-05-01: qty 2

## 2018-05-01 MED ORDER — LABETALOL HCL 5 MG/ML IV SOLN: 10.0000 mg | INTRAVENOUS | Status: DC | PRN

## 2018-05-01 SURGICAL SUPPLY — 34 items
BALLN ADMIRAL INPACT 5X250 (BALLOONS) ×5
BALLOON ADMIRAL INPACT 5X250 (BALLOONS) IMPLANT
CATH ANGIO 5F PIGTAIL 100CM (CATHETERS) ×1 IMPLANT
CATH CROSS OVER TEMPO 5F (CATHETERS) ×1 IMPLANT
CATH HAWKONE LS STANDARD TIP (CATHETERS) ×5
CATH HAWKONE LS STD TIP (CATHETERS) IMPLANT
CATH INFINITI 5 FR AR2 MOD (CATHETERS) ×1 IMPLANT
CATH INFINITI 5FR JL4 (CATHETERS) ×1 IMPLANT
CATH INFINITI 5FR MPB2 (CATHETERS) ×1 IMPLANT
CATH INFINITI JR4 5F (CATHETERS) ×1 IMPLANT
CATH SOFT-VU 4F 65 STRAIGHT (CATHETERS) IMPLANT
CATH SOFT-VU ST 4F 90CM (CATHETERS) ×1 IMPLANT
CATH SOFT-VU STRAIGHT 4F 65CM (CATHETERS) ×5
CATH STRAIGHT 5FR 65CM (CATHETERS) ×1 IMPLANT
CLOSURE MYNX CONTROL 6F/7F (Vascular Products) ×1 IMPLANT
FILTER CO2 0.2 MICRON (VASCULAR PRODUCTS) ×1 IMPLANT
GUIDEWIRE ANGLED .035X150CM (WIRE) ×1 IMPLANT
KIT ENCORE 26 ADVANTAGE (KITS) ×1 IMPLANT
KIT MICROPUNCTURE NIT STIFF (SHEATH) ×1 IMPLANT
KIT PV (KITS) ×5 IMPLANT
RESERVOIR CO2 (VASCULAR PRODUCTS) ×1 IMPLANT
SET FLUSH CO2 (MISCELLANEOUS) ×1 IMPLANT
SHEATH FLEXOR ANSEL 1 7F 45CM (SHEATH) ×1 IMPLANT
SHEATH PINNACLE 5F 10CM (SHEATH) ×1 IMPLANT
SHEATH PINNACLE 7F 10CM (SHEATH) ×1 IMPLANT
SHEATH PROBE COVER 6X72 (BAG) ×1 IMPLANT
SYR MEDRAD MARK 7 150ML (SYRINGE) ×5 IMPLANT
TAPE VIPERTRACK RADIOPAQ (MISCELLANEOUS) IMPLANT
TAPE VIPERTRACK RADIOPAQUE (MISCELLANEOUS) ×5
TRANSDUCER W/STOPCOCK (MISCELLANEOUS) ×5 IMPLANT
TRAY PV CATH (CUSTOM PROCEDURE TRAY) ×5 IMPLANT
TUBING CIL FLEX 10 FLL-RA (TUBING) ×5 IMPLANT
WIRE HI TORQ VERSACORE J 260CM (WIRE) ×1 IMPLANT
WIRE SPARTACORE .014X300CM (WIRE) ×2 IMPLANT

## 2018-05-01 NOTE — Interval H&P Note (Signed)
History and Physical Interval Note:  05/01/2018 7:32 AM  [name]  has presented today for surgery, with the diagnosis of pvd/chest pain  The various methods of treatment have been discussed with the patient and family. After consideration of risks, benefits and other options for treatment, the patient has consented to  Procedure(s): LEFT HEART CATH AND CORS/GRAFTS ANGIOGRAPHY (N/A) LOWER EXTREMITY ANGIOGRAPHY (N/A) as a surgical intervention . Also possible intervention has been discussed.  The patient's history has been reviewed, patient examined, no change in status, stable for surgery.  I have reviewed the patient's chart and labs.  Questions were answered to the patient's satisfaction.     Yates Decamp

## 2018-05-01 NOTE — Discharge Instructions (Signed)
Drink plenty of fluids over next 48 hours  Femoral Site Care This sheet gives you information about how to care for yourself after your procedure. Your health care provider may also give you more specific instructions. If you have problems or questions, contact your health care provider. What can I expect after the procedure? After the procedure, it is common to have:  Bruising that usually fades within 1-2 weeks.  Tenderness at the site. Follow these instructions at home: Wound care  Follow instructions from your health care provider about how to take care of your insertion site. Make sure you: ? Wash your hands with soap and water before you change your bandage (dressing). If soap and water are not available, use hand sanitizer. ? Change your dressing as told by your health care provider. ? Leave stitches (sutures), skin glue, or adhesive strips in place. These skin closures may need to stay in place for 2 weeks or longer. If adhesive strip edges start to loosen and curl up, you may trim the loose edges. Do not remove adhesive strips completely unless your health care provider tells you to do that.  Do not take baths, swim, or use a hot tub until your health care provider approves.  You may shower 24-48 hours after the procedure or as told by your health care provider. ? Gently wash the site with plain soap and water. ? Pat the area dry with a clean towel. ? Do not rub the site. This may cause bleeding.  Do not apply powder or lotion to the site. Keep the site clean and dry.  Check your femoral site every day for signs of infection. Check for: ? Redness, swelling, or pain. ? Fluid or blood. ? Warmth. ? Pus or a bad smell. Activity  For the first 2-3 days after your procedure, or as long as directed: ? Avoid climbing stairs as much as possible. ? Do not squat.  Do not lift anything that is heavier than 10 lb (4.5 kg), or the limit that you are told, until your health care  provider says that it is safe.  Rest as directed. ? Avoid sitting for a long time without moving. Get up to take short walks every 1-2 hours.  Do not drive for 24 hours if you were given a medicine to help you relax (sedative). General instructions  Take over-the-counter and prescription medicines only as told by your health care provider.  Keep all follow-up visits as told by your health care provider. This is important. Contact a health care provider if you have:  A fever or chills.  You have redness, swelling, or pain around your insertion site. Get help right away if:  The catheter insertion area swells very fast.  You pass out.  You suddenly start to sweat or your skin gets clammy.  The catheter insertion area is bleeding, and the bleeding does not stop when you hold steady pressure on the area.  The area near or just beyond the catheter insertion site becomes pale, cool, tingly, or numb. These symptoms may represent a serious problem that is an emergency. Do not wait to see if the symptoms will go away. Get medical help right away. Call your local emergency services (911 in the [place] Do not drive yourself to the hospital. Summary  After the procedure, it is common to have bruising that usually fades within 1-2 weeks.  Check your femoral site every day for signs of infection.  Do not lift anything that  is heavier than 10 lb (4.5 kg), or the limit that you are told, until your health care provider says that it is safe. This information is not intended to replace advice given to you by your health care provider. Make sure you discuss any questions you have with your health care provider. Document Released: 11/29/2013 Document Revised: 04/10/2017 Document Reviewed: 04/10/2017 Elsevier Interactive Patient Education  2019 [employer].

## 2018-05-02 ENCOUNTER — Encounter (HOSPITAL_COMMUNITY): Payer: Self-pay | Admitting: Cardiology

## 2018-05-02 MED FILL — Ondansetron HCl Inj 4 MG/2ML (2 MG/ML): INTRAMUSCULAR | Qty: 2 | Status: AC

## 2018-05-13 ENCOUNTER — Other Ambulatory Visit: Payer: Self-pay | Admitting: Cardiology

## 2018-05-13 DIAGNOSIS — I739 Peripheral vascular disease, unspecified: Secondary | ICD-10-CM

## 2018-05-14 ENCOUNTER — Other Ambulatory Visit: Payer: Self-pay | Admitting: Cardiology

## 2018-05-21 ENCOUNTER — Ambulatory Visit (INDEPENDENT_AMBULATORY_CARE_PROVIDER_SITE_OTHER): Payer: Medicare Other | Admitting: Orthopaedic Surgery

## 2018-05-22 ENCOUNTER — Encounter (INDEPENDENT_AMBULATORY_CARE_PROVIDER_SITE_OTHER): Payer: Self-pay | Admitting: Orthopaedic Surgery

## 2018-05-22 ENCOUNTER — Ambulatory Visit (INDEPENDENT_AMBULATORY_CARE_PROVIDER_SITE_OTHER): Payer: Self-pay

## 2018-05-22 ENCOUNTER — Ambulatory Visit (INDEPENDENT_AMBULATORY_CARE_PROVIDER_SITE_OTHER): Payer: Medicare Other | Admitting: Orthopaedic Surgery

## 2018-05-22 DIAGNOSIS — M25561 Pain in right knee: Secondary | ICD-10-CM

## 2018-05-22 DIAGNOSIS — M1711 Unilateral primary osteoarthritis, right knee: Secondary | ICD-10-CM | POA: Diagnosis not present

## 2018-05-22 DIAGNOSIS — M25562 Pain in left knee: Secondary | ICD-10-CM

## 2018-05-22 DIAGNOSIS — M1712 Unilateral primary osteoarthritis, left knee: Secondary | ICD-10-CM | POA: Diagnosis not present

## 2018-05-22 DIAGNOSIS — G8929 Other chronic pain: Secondary | ICD-10-CM

## 2018-05-22 NOTE — Progress Notes (Signed)
Office Visit Note   Patient: [name]           Date of Birth: 12/01/1945           MRN: [id number] Visit Date: 05/22/2018              Requested by: Adrian PrinceSouth, [name], MD [address] [place], [place] [id number] PCP: Adrian PrinceSouth, [name], MD   Assessment & Plan: Visit Diagnoses:  1. Chronic pain of left knee   2. Chronic pain of right knee   3. Unilateral primary osteoarthritis, left knee   4. Unilateral primary osteoarthritis, right knee     Plan: At this point I agree that a knee replacement and at least his left knee in the near future would be helpful.  I would only replace the most painful knee first and consider a knee replacement on his other knee once he is over the surgery on the first knee.  I showed a knee model to him and went over in detail what his arthritis is shown in his x-rays.  We talked about knee replacement surgery using a knee model with a knee replacement.  We discussed his operative and nonoperative treatment options.  We discussed what surgery involves in terms of the risk and benefits.  We talked about his intraoperative and postoperative course.  He does wish to consider at least starting with a left knee replacement in the near future.  However he would like to have our surgery scheduler's card today and talk to his wife further about this.  He is been referred to me already by his cardiologist Dr. Jacinto HalimGanji to consider the surgery.  He understands that I will have him stop Plavix a week before surgery.  All question concerns were answered and addressed.  Again he has our surgery scheduler's card.  Follow-Up Instructions: Return if symptoms worsen or fail to improve, for 2 weeks post-op.   Orders:  Orders Placed This Encounter  Procedures  . XR KNEE 3 VIEW LEFT  . XR KNEE 3 VIEW RIGHT   No orders of the defined types were placed in this encounter.     Procedures: No procedures performed   Clinical Data: No additional findings.   Subjective: Chief  Complaint  Patient presents with  . Right Knee - Pain  . Left Knee - Pain  The patient is someone I am seeing for the first time for bilateral knee pain and known severe osteoarthritis.  He is actually sent by his cardiologist Dr. Jacinto HalimGanji to evaluate and treat this arthritis.  He is actually had multiple injections of steroids and hyaluronic acid in these knees before.  His pain is daily and at this point is 10 out of 10.  His bilateral knee pain is detrimentally affect his mobility, his quality of life and his activities daily living.  He is on Plavix as well.  At this point he wants to consider knee replacement surgery.  His left knee hurts much worse than the right knee both knees have severe arthritis.  He ambulates using a rolling walker.  He is work on activity modification as well.  HPI  Review of Systems He currently denies any headache, chest pain, shortness of breath, fever, chills, nausea, vomiting  Objective: Vital Signs: There were no vitals taken for this visit.  Physical Exam He is alert and oriented x3 and in no acute distress Ortho Exam Examination of both knees show no effusion.  Both knees show significant varus malalignment.  There is significant patellofemoral arthritis and grinding in both knees.  Both knees have medial and lateral joint line tenderness.  Neither knees varus deformity is correctable. Specialty Comments:  No specialty comments available.  Imaging: Xr Knee 3 View Left  Result Date: 05/22/2018 3 views of the left knee show severe end-stage arthritis of left knee.  There is complete loss of the medial joint space.  There are peritracheal osteophytes in all 3 compartments and severe patellofemoral disease.  There is essentially no medial space remaining.  Xr Knee 3 View Right  Result Date: 05/22/2018 3 views of the right knee show severe end-stage arthritis with complete loss of the majority of the joint space.  There is essentially no medial space left and  there is severe arthritic changes in all 3 compartments.    PMFS History: Patient Active Problem List   Diagnosis Date Noted  . Unilateral primary osteoarthritis, left knee 05/22/2018  . Unilateral primary osteoarthritis, right knee 05/22/2018  . Claudication in peripheral vascular disease (HCC) 04/30/2018  . IBS (irritable bowel syndrome) 11/12/2017  . Supplemental oxygen dependent 11/07/2017  . MCI (mild cognitive impairment) with memory loss 11/07/2017  . Sleep-related hypoxia 06/26/2017  . Chronic diastolic CHF (congestive heart failure) (HCC) 06/26/2017  . Stroke-like symptoms 03/30/2017  . CKD stage 3 due to type 2 diabetes mellitus (HCC)   . Benign essential HTN   . TIA (transient ischemic attack) 03/29/2017  . 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 History of completed stroke 10/24/2016  . Coronary artery disease involving coronary bypass graft with unspecified angina pectoris 05/21/2014  . Sleep related hypoventilation/hypoxemia in other disease 01/15/2014  . Primary snoring 12/09/2013  . Hypersomnia with sleep apnea 12/09/2013  . CAD (coronary artery disease) of artery bypass graft 12/09/2013   Past Medical History:  Diagnosis Date  . Anxiety   . CAD (coronary artery disease) of artery bypass graft 12/09/2013  . Chronic lower back pain   . Coronary artery disease   . Dementia (HCC)    "step dementia; from mini strokes in 2018" (11/08/2017)  . Depression   . Diabetes mellitus    "totally diet controlled" (11/08/2017)  . Hearing loss    wears bilateral hearing aids  . High cholesterol   . History of kidney stones   . Hypertension   . MI (myocardial infarction) (HCC) [phone]   (several)  . On home oxygen therapy    "2L when he sleeps at night" (11/08/2017)  . Osteoarthritis of both knees    "both knees need replaced" (11/08/2017)  . Sleep apnea    "mild; uses oxygen" (11/08/2017)  . Stroke [facility]) 2018   "several silent ones; a  little memory loss; step dementia" (11/08/2017)    Family History  Problem Relation Age of Onset  . Heart disease Mother   . Liver cancer Other   . Parkinson's disease Other   . Heart disease Brother   . Parkinsonism Sister   . Cancer Sister     Past Surgical History:  Procedure Laterality Date  . 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 CARDIAC CATHETERIZATION     "many"  . CAROTID ENDARTERECTOMY Left   . CORONARY ARTERY BYPASS GRAFT  1996   5 vessels  . CORONARY/GRAFT ANGIOGRAPHY N/A 05/01/2018   Procedure: CORONARY/GRAFT ANGIOGRAPHY;  Surgeon: Yates Decamp, MD;  Location: [facility] INVASIVE CV LAB;  Service: Cardiovascular;  Laterality: N/A;  . FRACTURE SURGERY    . KIDNEY STONE SURGERY  ~ 2014   "laparoscopic, robotic OR"  . LOWER EXTREMITY ANGIOGRAPHY Bilateral 05/01/2018   Procedure: LOWER EXTREMITY ANGIOGRAPHY;  Surgeon: Yates DecampGanji, Jay, MD;  Location: [facility] INVASIVE CV LAB;  Service: Cardiovascular;  Laterality: Bilateral;  . PERIPHERAL VASCULAR ATHERECTOMY Left 05/01/2018   Procedure: PERIPHERAL VASCULAR ATHERECTOMY;  Surgeon: Yates DecampGanji, Jay, MD;  [facility];  Service: Cardiovascular;  Laterality: Left;  SFA  . PERIPHERAL VASCULAR BALLOON ANGIOPLASTY  05/01/2018   Procedure: PERIPHERAL VASCULAR BALLOON ANGIOPLASTY;  Surgeon: Yates DecampGanji, Jay, MD;  Location: [facility] INVASIVE CV LAB;  Service: Cardiovascular;;  . WRIST FRACTURE SURGERY Left 2001  . WRIST SURGERY     Social History   Occupational History  . Not on file  Tobacco Use  . Smoking status: Former Smoker    Packs/day: 2.00    Years: 20.00    Pack years: 40.00    Types: Cigarettes    Last attempt to quit: 10/20/1983    Years since quitting: 34.6  . Smokeless tobacco: Never Used  Substance and Sexual Activity  . Alcohol use: Not Currently  . Drug use: Yes    Types: Marijuana    Comment: 11/08/2017 "couple times/month"  . Sexual activity: Not Currently

## 2018-05-31 ENCOUNTER — Emergency Department (HOSPITAL_COMMUNITY): Payer: Medicare Other

## 2018-05-31 ENCOUNTER — Other Ambulatory Visit: Payer: Self-pay

## 2018-05-31 ENCOUNTER — Inpatient Hospital Stay (HOSPITAL_COMMUNITY)
Admission: EM | Admit: 2018-05-31 | Discharge: 2018-06-04 | DRG: 342 | Disposition: A | Discharge status: Home / Self Care | Payer: Medicare Other | Attending: General Surgery | Admitting: General Surgery

## 2018-05-31 ENCOUNTER — Encounter (HOSPITAL_COMMUNITY): Payer: Self-pay | Admitting: *Deleted

## 2018-05-31 DIAGNOSIS — I2510 Atherosclerotic heart disease of native coronary artery without angina pectoris: Secondary | ICD-10-CM | POA: Diagnosis present

## 2018-05-31 DIAGNOSIS — Z820 Family history of epilepsy and other diseases of the nervous system: Secondary | ICD-10-CM | POA: Diagnosis not present

## 2018-05-31 DIAGNOSIS — Z974 Presence of external hearing-aid: Secondary | ICD-10-CM

## 2018-05-31 DIAGNOSIS — Z87891 Personal history of nicotine dependence: Secondary | ICD-10-CM | POA: Diagnosis not present

## 2018-05-31 DIAGNOSIS — N183 Chronic kidney disease, stage 3 (moderate): Secondary | ICD-10-CM | POA: Diagnosis present

## 2018-05-31 DIAGNOSIS — I252 Old myocardial infarction: Secondary | ICD-10-CM

## 2018-05-31 DIAGNOSIS — F419 Anxiety disorder, unspecified: Secondary | ICD-10-CM | POA: Diagnosis present

## 2018-05-31 DIAGNOSIS — Z8249 Family history of ischemic heart disease and other diseases of the circulatory system: Secondary | ICD-10-CM

## 2018-05-31 DIAGNOSIS — G4730 Sleep apnea, unspecified: Secondary | ICD-10-CM | POA: Diagnosis present

## 2018-05-31 DIAGNOSIS — Z7902 Long term (current) use of antithrombotics/antiplatelets: Secondary | ICD-10-CM | POA: Diagnosis not present

## 2018-05-31 DIAGNOSIS — Z800 Family history of malignant neoplasm of digestive organs: Secondary | ICD-10-CM

## 2018-05-31 DIAGNOSIS — Z7189 Other specified counseling: Secondary | ICD-10-CM | POA: Diagnosis not present

## 2018-05-31 DIAGNOSIS — Z955 Presence of coronary angioplasty implant and graft: Secondary | ICD-10-CM

## 2018-05-31 DIAGNOSIS — E7800 Pure hypercholesterolemia, unspecified: Secondary | ICD-10-CM | POA: Diagnosis present

## 2018-05-31 DIAGNOSIS — E1122 Type 2 diabetes mellitus with diabetic chronic kidney disease: Secondary | ICD-10-CM | POA: Diagnosis present

## 2018-05-31 DIAGNOSIS — I739 Peripheral vascular disease, unspecified: Secondary | ICD-10-CM | POA: Diagnosis not present

## 2018-05-31 DIAGNOSIS — Z9981 Dependence on supplemental oxygen: Secondary | ICD-10-CM

## 2018-05-31 DIAGNOSIS — E785 Hyperlipidemia, unspecified: Secondary | ICD-10-CM | POA: Diagnosis present

## 2018-05-31 DIAGNOSIS — I5032 Chronic diastolic (congestive) heart failure: Secondary | ICD-10-CM | POA: Diagnosis present

## 2018-05-31 DIAGNOSIS — I69311 Memory deficit following cerebral infarction: Secondary | ICD-10-CM | POA: Diagnosis not present

## 2018-05-31 DIAGNOSIS — I130 Hypertensive heart and chronic kidney disease with heart failure and stage 1 through stage 4 chronic kidney disease, or unspecified chronic kidney disease: Secondary | ICD-10-CM | POA: Diagnosis present

## 2018-05-31 DIAGNOSIS — Z7982 Long term (current) use of aspirin: Secondary | ICD-10-CM

## 2018-05-31 DIAGNOSIS — M170 Bilateral primary osteoarthritis of knee: Secondary | ICD-10-CM | POA: Diagnosis present

## 2018-05-31 DIAGNOSIS — K3580 Unspecified acute appendicitis: Principal | ICD-10-CM | POA: Diagnosis present

## 2018-05-31 DIAGNOSIS — I25810 Atherosclerosis of coronary artery bypass graft(s) without angina pectoris: Secondary | ICD-10-CM | POA: Diagnosis not present

## 2018-05-31 DIAGNOSIS — Z79899 Other long term (current) drug therapy: Secondary | ICD-10-CM | POA: Diagnosis not present

## 2018-05-31 DIAGNOSIS — E1151 Type 2 diabetes mellitus with diabetic peripheral angiopathy without gangrene: Secondary | ICD-10-CM | POA: Diagnosis present

## 2018-05-31 DIAGNOSIS — I350 Nonrheumatic aortic (valve) stenosis: Secondary | ICD-10-CM | POA: Diagnosis not present

## 2018-05-31 DIAGNOSIS — Z880 Allergy status to penicillin: Secondary | ICD-10-CM

## 2018-05-31 LAB — COMPREHENSIVE METABOLIC PANEL
ALT: 21 U/L (ref 0–44)
AST: 26 U/L (ref 15–41)
Albumin: 4 g/dL (ref 3.5–5.0)
Alkaline Phosphatase: 31 U/L — ABNORMAL LOW (ref 38–126)
Anion gap: 8 (ref 5–15)
BUN: 34 mg/dL — ABNORMAL HIGH (ref 8–23)
CO2: 27 mmol/L (ref 22–32)
Calcium: 9.2 mg/dL (ref 8.9–10.3)
Chloride: 102 mmol/L (ref 98–111)
Creatinine, Ser: 1.48 mg/dL — ABNORMAL HIGH (ref 0.61–1.24)
GFR calc Af Amer: 53 mL/min — ABNORMAL LOW (ref >60)
GFR calc non Af Amer: 46 mL/min — ABNORMAL LOW (ref >60)
Glucose, Bld: 123 mg/dL — ABNORMAL HIGH (ref 70–99)
Potassium: 4.1 mmol/L (ref 3.5–5.1)
Sodium: 137 mmol/L (ref 135–145)
Total Bilirubin: 0.7 mg/dL (ref 0.3–1.2)
Total Protein: 7 g/dL (ref 6.5–8.1)

## 2018-05-31 LAB — CBC
HCT: 36.6 % — ABNORMAL LOW (ref 39.0–52.0)
Hemoglobin: 12.2 g/dL — ABNORMAL LOW (ref 13.0–17.0)
MCH: 31.7 pg (ref 26.0–34.0)
MCHC: 33.3 g/dL (ref 30.0–36.0)
MCV: 95.1 fL (ref 80.0–100.0)
Platelets: 177 10*3/uL (ref 150–400)
RBC: 3.85 MIL/uL — ABNORMAL LOW (ref 4.22–5.81)
RDW: 14.1 % (ref 11.5–15.5)
WBC: 8.2 10*3/uL (ref 4.0–10.5)
nRBC: 0 % (ref 0.0–0.2)

## 2018-05-31 LAB — URINALYSIS, ROUTINE W REFLEX MICROSCOPIC
Bacteria, UA: NONE SEEN
Bilirubin Urine: NEGATIVE
Glucose, UA: NEGATIVE mg/dL
Hgb urine dipstick: NEGATIVE
Ketones, ur: 5 mg/dL — AB
Nitrite: NEGATIVE
Protein, ur: NEGATIVE mg/dL
Specific Gravity, Urine: 1.031 — ABNORMAL HIGH (ref 1.005–1.030)
pH: 5 (ref 5.0–8.0)

## 2018-05-31 LAB — LIPASE, BLOOD: Lipase: 38 U/L (ref 11–51)

## 2018-05-31 MED ORDER — ACETAMINOPHEN 325 MG PO TABS: 650.0000 mg | ORAL_TABLET | Freq: Four times a day (QID) | ORAL | Status: DC | PRN

## 2018-05-31 MED ORDER — ONDANSETRON HCL 4 MG/2ML IJ SOLN: 4.0000 mg | Freq: Four times a day (QID) | INTRAMUSCULAR | Status: DC | PRN

## 2018-05-31 MED ORDER — ISOSORBIDE MONONITRATE ER 60 MG PO TB24
60.0000 mg | ORAL_TABLET | Freq: Two times a day (BID) | ORAL | Status: DC
  Administered 2018-06-01 – 2018-06-04 (×7): 60 mg via ORAL
  Filled 2018-05-31 (×6): qty 1

## 2018-05-31 MED ORDER — IOPAMIDOL (ISOVUE-300) INJECTION 61%
100.0000 mL | Freq: Once | INTRAVENOUS | Status: AC | PRN
  Administered 2018-05-31: 100 mL via INTRAVENOUS

## 2018-05-31 MED ORDER — METRONIDAZOLE IN NACL 5-0.79 MG/ML-% IV SOLN
500.0000 mg | Freq: Once | INTRAVENOUS | Status: AC
  Administered 2018-05-31: 500 mg via INTRAVENOUS
  Filled 2018-05-31: qty 100

## 2018-05-31 MED ORDER — ONDANSETRON 4 MG PO TBDP: 4.0000 mg | ORAL_TABLET | Freq: Four times a day (QID) | ORAL | Status: DC | PRN

## 2018-05-31 MED ORDER — KCL IN DEXTROSE-NACL 20-5-0.45 MEQ/L-%-% IV SOLN
INTRAVENOUS | Status: DC
  Administered 2018-06-01 – 2018-06-03 (×3): via INTRAVENOUS
  Filled 2018-05-31 (×4): qty 1000

## 2018-05-31 MED ORDER — ALPRAZOLAM 0.5 MG PO TABS
0.5000 mg | ORAL_TABLET | Freq: Every day | ORAL | Status: DC
  Administered 2018-05-31 – 2018-06-03 (×4): 0.5 mg via ORAL
  Filled 2018-05-31 (×4): qty 1

## 2018-05-31 MED ORDER — METRONIDAZOLE IN NACL 5-0.79 MG/ML-% IV SOLN
500.0000 mg | Freq: Three times a day (TID) | INTRAVENOUS | Status: DC
  Administered 2018-06-01 – 2018-06-04 (×10): 500 mg via INTRAVENOUS
  Filled 2018-05-31 (×10): qty 100

## 2018-05-31 MED ORDER — DULOXETINE HCL 60 MG PO CPEP
60.0000 mg | ORAL_CAPSULE | Freq: Every day | ORAL | Status: DC
  Administered 2018-05-31 – 2018-06-03 (×4): 60 mg via ORAL
  Filled 2018-05-31 (×2): qty 2
  Filled 2018-05-31: qty 1
  Filled 2018-05-31: qty 2

## 2018-05-31 MED ORDER — CARVEDILOL 25 MG PO TABS
25.0000 mg | ORAL_TABLET | Freq: Two times a day (BID) | ORAL | Status: DC
  Administered 2018-06-01 – 2018-06-04 (×7): 25 mg via ORAL
  Filled 2018-05-31 (×6): qty 1

## 2018-05-31 MED ORDER — SODIUM CHLORIDE 0.9 % IV SOLN
2.0000 g | INTRAVENOUS | Status: DC
  Administered 2018-06-01 – 2018-06-03 (×3): 2 g via INTRAVENOUS
  Filled 2018-05-31 (×3): qty 2

## 2018-05-31 MED ORDER — NITROGLYCERIN 0.4 MG SL SUBL
0.4000 mg | SUBLINGUAL_TABLET | SUBLINGUAL | Status: DC | PRN
  Administered 2018-06-04: 0.4 mg via SUBLINGUAL
  Filled 2018-05-31: qty 1

## 2018-05-31 MED ORDER — SODIUM CHLORIDE 0.9 % IV SOLN
INTRAVENOUS | Status: DC
  Administered 2018-05-31 (×2): via INTRAVENOUS

## 2018-05-31 MED ORDER — OXYCODONE HCL 5 MG PO TABS: 5.0000 mg | ORAL_TABLET | ORAL | Status: DC | PRN

## 2018-05-31 MED ORDER — SODIUM CHLORIDE 0.9 % IV SOLN
2.0000 g | Freq: Once | INTRAVENOUS | Status: AC
  Administered 2018-05-31: 2 g via INTRAVENOUS
  Filled 2018-05-31: qty 20

## 2018-05-31 MED ORDER — LOSARTAN POTASSIUM 25 MG PO TABS
25.0000 mg | ORAL_TABLET | Freq: Every day | ORAL | Status: DC
  Administered 2018-06-01 – 2018-06-04 (×3): 25 mg via ORAL
  Filled 2018-05-31 (×3): qty 1

## 2018-05-31 MED ORDER — TRAMADOL HCL 50 MG PO TABS: 50.0000 mg | ORAL_TABLET | Freq: Four times a day (QID) | ORAL | Status: DC | PRN

## 2018-05-31 MED ORDER — SODIUM CHLORIDE 0.9% FLUSH: 3.0000 mL | Freq: Once | INTRAVENOUS | Status: DC

## 2018-05-31 MED ORDER — ALPRAZOLAM 0.5 MG PO TABS: 0.5000 mg | ORAL_TABLET | Freq: Every day | ORAL | Status: DC | PRN

## 2018-05-31 MED ORDER — ACETAMINOPHEN 650 MG RE SUPP: 650.0000 mg | Freq: Four times a day (QID) | RECTAL | Status: DC | PRN

## 2018-05-31 MED ORDER — RANOLAZINE ER 500 MG PO TB12
1000.0000 mg | ORAL_TABLET | Freq: Two times a day (BID) | ORAL | Status: DC
  Administered 2018-05-31 – 2018-06-04 (×7): 1000 mg via ORAL
  Filled 2018-05-31 (×8): qty 2

## 2018-05-31 NOTE — ED Provider Notes (Signed)
[facility]-EMERGENCY DEPT Provider Note   CSN: [id number] Arrival date & time: 05/31/18  1723    History   Chief Complaint Chief Complaint  Patient presents with  . Abdominal Pain    HPI [name] is a 73 y.o. male.     HPI   73 year old male with abdominal pain.  Right lower quadrant.  Began to have some mild pain late last night.  Progressively worsened throughout the day today.  No appetite tight.  He has a past history of appendicitis.  This past July he was admitted and treated medically.  At that time there was hesitancy to pursue surgery given his significant cardiac history.  He reports that he recovered well.  Denies any fevers.  No acute urinary complaints.  No vomiting or diarrhea.  Feels mildly nauseated.  Past Medical History:  Diagnosis Date  . Anxiety   . CAD (coronary artery disease) of artery bypass graft 12/09/2013  . Chronic lower back pain   . Coronary artery disease   . Dementia (HCC)    "step dementia; from mini strokes in 2018" (11/08/2017)  . Depression   . Diabetes mellitus    "totally diet controlled" (11/08/2017)  . Hearing loss    wears bilateral hearing aids  . High cholesterol   . History of kidney stones   . Hypertension   . MI (myocardial infarction) (HCC) [phone]   (several)  . On home oxygen therapy    "2L when he sleeps at night" (11/08/2017)  . Osteoarthritis of both knees    "both knees need replaced" (11/08/2017)  . Sleep apnea    "mild; uses oxygen" (11/08/2017)  . Stroke [facility]) 2018   "several silent ones; a little memory loss; step dementia" (11/08/2017)    Patient Active Problem List   Diagnosis Date Noted  . Unilateral primary osteoarthritis, left knee 05/22/2018  . Unilateral primary osteoarthritis, right knee 05/22/2018  . Claudication in peripheral vascular disease (HCC) 04/30/2018  . IBS (irritable bowel syndrome) 11/12/2017  . Supplemental oxygen dependent 11/07/2017  . MCI (mild cognitive  impairment) with memory loss 11/07/2017  . Sleep-related hypoxia 06/26/2017  . Chronic diastolic CHF (congestive heart failure) (HCC) 06/26/2017  . Stroke-like symptoms 03/30/2017  . CKD stage 3 due to type 2 diabetes mellitus (HCC)   . Benign essential HTN   . TIA (transient ischemic attack) 03/29/2017  . 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 History of completed stroke 10/24/2016  . Coronary artery disease involving coronary bypass graft with unspecified angina pectoris 05/21/2014  . Sleep related hypoventilation/hypoxemia in other disease 01/15/2014  . Primary snoring 12/09/2013  . Hypersomnia with sleep apnea 12/09/2013  . CAD (coronary artery disease) of artery bypass graft 12/09/2013    Past Surgical History:  Procedure Laterality Date  . 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 CARDIAC CATHETERIZATION     "many"  . CAROTID ENDARTERECTOMY Left   . CORONARY ARTERY BYPASS GRAFT  1996   5 vessels  . CORONARY/GRAFT ANGIOGRAPHY N/A 05/01/2018   Procedure: CORONARY/GRAFT ANGIOGRAPHY;  Surgeon: Yates Decamp, MD;  Location: [facility] INVASIVE CV LAB;  Service: Cardiovascular;  Laterality: N/A;  . FRACTURE SURGERY    . KIDNEY STONE SURGERY  ~ 2014   "laparoscopic, robotic OR"  . LOWER EXTREMITY ANGIOGRAPHY Bilateral 05/01/2018   Procedure: LOWER EXTREMITY ANGIOGRAPHY;  Surgeon: Yates Decamp, MD;  Location: [facility] INVASIVE  CV LAB;  Service: Cardiovascular;  Laterality: Bilateral;  . PERIPHERAL VASCULAR ATHERECTOMY Left 05/01/2018   Procedure: PERIPHERAL VASCULAR ATHERECTOMY;  Surgeon: Yates Decamp, MD;  Location: [facility] INVASIVE CV LAB;  Service: Cardiovascular;  Laterality: Left;  SFA  . PERIPHERAL VASCULAR BALLOON ANGIOPLASTY  05/01/2018   Procedure: PERIPHERAL VASCULAR BALLOON ANGIOPLASTY;  Surgeon: Yates Decamp, MD;  Location: [facility] INVASIVE CV LAB;  Service: Cardiovascular;;  . WRIST FRACTURE SURGERY Left 2001  .  WRIST SURGERY          Home Medications    Prior to Admission medications   Medication Sig Start Date End Date Taking? Authorizing Provider  ALPRAZolam Prudy Feeler) 0.5 MG tablet Take 1 tablet (0.5 mg total) at bedtime as needed by mouth. Patient taking differently: Take 0.5 mg by mouth See admin instructions. Take 0.5 mg at bedtime, may take an additional 0.5 mg dose as needed for anxiety 02/24/17  Yes Dohmeier, Porfirio Mylar, MD  aspirin EC 81 MG tablet Take 81 mg by mouth at bedtime.   Yes [provider]  atorvastatin (LIPITOR) 20 MG tablet Take 20 mg by mouth at bedtime.   Yes [provider]  carvedilol (COREG) 25 MG tablet Take 25 mg by mouth 2 (two) times daily with a meal.   Yes [provider]  clopidogrel (PLAVIX) 75 MG tablet Take 75 mg by mouth every morning.    Yes [provider]  DULoxetine (CYMBALTA) 60 MG capsule Take 60 mg by mouth at bedtime.   Yes [provider]  fenofibrate 160 MG tablet Take 160 mg by mouth daily.   Yes [provider]  isosorbide mononitrate (IMDUR) 30 MG 24 hr tablet Take 60 mg by mouth 2 (two) times daily.   Yes [provider]  LINZESS 145 MCG CAPS capsule Take 145 mcg by mouth daily as needed (constipation).  01/09/17  Yes [provider]  losartan (COZAAR) 25 MG tablet Take 0.5 tablets (12.5 mg total) by mouth daily. Patient taking differently: Take 25 mg by mouth daily.  04/01/17  Yes Vassie Loll, MD  Multiple Vitamins-Minerals (MULTIVITAMIN PO) Take 1 tablet by mouth at bedtime.    Yes [provider]  nitroGLYCERIN (NITROSTAT) 0.4 MG SL tablet Place 1 tablet (0.4 mg total) under the tongue every 5 (five) minutes as needed for chest pain. 06/26/13  Yes Runell Gess, MD  ranolazine (RANEXA) 1000 MG SR tablet Take 1,000 mg by mouth 2 (two) times daily.    Yes [provider]    Family History Family History  Problem Relation Age of Onset  . Heart disease  Mother   . Liver cancer Other   . Parkinson's disease Other   . Heart disease Brother   . Parkinsonism Sister   . Cancer Sister     Social History Social History   Tobacco Use  . Smoking status: Former Smoker    Packs/day: 2.00    Years: 20.00    Pack years: 40.00    Types: Cigarettes    Last attempt to quit: 10/20/1983    Years since quitting: 34.6  . Smokeless tobacco: Never Used  Substance Use Topics  . Alcohol use: Not Currently  . Drug use: Yes    Types: Marijuana    Comment: 11/08/2017 "couple times/month"     Allergies   Penicillins   Review of Systems Review of Systems   All systems reviewed and negative, other than as noted in HPI.    Physical Exam Updated Vital  Signs BP 136/61 (BP Location: Right Arm)   Pulse 70   Temp 97.7 F (36.5 C) (Oral)   Resp 16   Ht 5\' 9"  (1.753 m)   Wt 99.8 kg   SpO2 100%   BMI 32.49 kg/m   Physical Exam Vitals signs and nursing note reviewed.  Constitutional:      General: He is not in acute distress.    Appearance: He is well-developed.  HENT:     Head: Normocephalic and atraumatic.  Eyes:     General:        Right eye: No discharge.        Left eye: No discharge.     Conjunctiva/sclera: Conjunctivae normal.  Neck:     Musculoskeletal: Neck supple.  Cardiovascular:     Rate and Rhythm: Normal rate and regular rhythm.     Heart sounds: Normal heart sounds. No murmur. No friction rub. No gallop.   Pulmonary:     Effort: Pulmonary effort is normal. No respiratory distress.     Breath sounds: Normal breath sounds.  Abdominal:     General: There is no distension.     Palpations: Abdomen is soft.     Tenderness: There is abdominal tenderness. There is no guarding or rebound.     Comments: Tenderness in right upper right lower quadrants and suprapubically.  Focally worse in right lower quadrant.  No rebound or guarding.  Musculoskeletal:        General: No tenderness.  Skin:    General: Skin is warm and dry.    Neurological:     Mental Status: He is alert.  Psychiatric:        Behavior: Behavior normal.        Thought Content: Thought content normal.      ED Treatments / Results  Labs (all labs ordered are listed, but only abnormal results are displayed) Labs Reviewed  COMPREHENSIVE METABOLIC PANEL - Abnormal; Notable for the following components:      Result Value   Glucose, Bld 123 (*)    BUN 34 (*)    Creatinine, Ser 1.48 (*)    Alkaline Phosphatase 31 (*)    GFR calc non Af Amer 46 (*)    GFR calc Af Amer 53 (*)    All other components within normal limits  CBC - Abnormal; Notable for the following components:   RBC 3.85 (*)    Hemoglobin 12.2 (*)    HCT 36.6 (*)    All other components within normal limits  URINALYSIS, ROUTINE W REFLEX MICROSCOPIC - Abnormal; Notable for the following components:   Specific Gravity, Urine 1.031 (*)    Ketones, ur 5 (*)    Leukocytes,Ua TRACE (*)    All other components within normal limits  LIPASE, BLOOD    EKG None  Radiology Ct Abdomen Pelvis W Contrast  Result Date: 05/31/2018 CLINICAL DATA:  73 year old male with right lower quadrant abdominal pain for 1 day. EXAM: CT ABDOMEN AND PELVIS WITH CONTRAST TECHNIQUE: Multidetector CT imaging of the abdomen and pelvis was performed using the standard protocol following bolus administration of intravenous contrast. CONTRAST:  ISOVUE-300 IOPAMIDOL (ISOVUE-300) INJECTION 61% COMPARISON:  CT Abdomen and Pelvis 11/08/2017 and earlier. FINDINGS: Lower chest: Prior sternotomy. Evidence of CABG. Mild cardiomegaly. No pericardial effusion. Negative lung bases. Hepatobiliary: Negative liver and gallbladder. Pancreas: Negative. Spleen: Negative. Adrenals/Urinary Tract: Normal adrenal glands. Mild asymmetric right renal atrophy but preserved bilateral renal enhancement and contrast excretion. No hydronephrosis  or hydroureter. Unremarkable urinary bladder. Stomach/Bowel: Negative rectum. Mildly  redundant sigmoid colon with moderate diverticulosis. No active inflammation. Lesser diverticula in the descending colon, no active inflammation. Redundant but otherwise negative transverse colon. The cecum is on a lax mesentery in the anterior abdomen the appendix is dilated with wall thickening, hyperenhancement, and regional fat stranding. There is trace dependent fluid. The terminal ileum is negative. Appendix: Location: Posterolateral to the cecum on series 2, image 48. Diameter: 16-18 millimeters. Appendicolith: None identified. Mucosal hyper-enhancement: Present Extraluminal gas: Negative. Periappendiceal collection: Negative. No dilated small bowel. Decompressed stomach. Small duodenum diverticulum in the midline on series 2, image 34. No free air. Vascular/Lymphatic: Aortoiliac calcified atherosclerosis. Major arterial structures are patent. Portal venous system is patent. Reproductive: Negative. Other: No pelvic free fluid. Musculoskeletal: Prior sternotomy. Advanced lumbar disc degeneration. No acute osseous abnormality identified. IMPRESSION: 1. Positive for Acute Appendicitis. Dilated appendix with regional inflammation and trace reactive appearing fluid. Negative for perforation or abscess. 2. Mild right renal atrophy. Electronically Signed   By: Odessa FlemingH  Hall [name]   On: 05/31/2018 19:52    Procedures Procedures (including critical care time)  Medications Ordered in ED Medications  sodium chloride flush (NS) 0.9 % injection 3 mL (has no administration in time range)  cefTRIAXone (ROCEPHIN) 2 g in sodium chloride 0.9 % 100 mL IVPB (has no administration in time range)    And  metroNIDAZOLE (FLAGYL) IVPB 500 mg (has no administration in time range)  iopamidol (ISOVUE-300) 61 % injection 100 mL (100 mLs Intravenous Contrast Given 05/31/18 1923)     Initial Impression / Assessment and Plan / ED Course  I have reviewed the triage vital signs and the nursing notes.  Pertinent labs & imaging  results that were available during my care of the patient were reviewed by me and considered in my medical decision making (see chart for details).  73 year old male with abdominal pain.  CT significant for acute appendicitis.  He had the same this last July which responded to antibiotics.  He is interested in pursuing surgery if possible.  General surgery was consulted.  Antibiotics ordered. He declined pain/nausea medication as this time.   Final Clinical Impressions(s) / ED Diagnoses   Final diagnoses:  Acute appendicitis, unspecified acute appendicitis type    ED Discharge Orders    None       Raeford RazorKohut, Trinadee Verhagen, MD 05/31/18 2116

## 2018-05-31 NOTE — ED Notes (Signed)
Pt has returned from CT scan.

## 2018-05-31 NOTE — ED Notes (Signed)
Bed: WA03 Expected date:  Expected time:  Means of arrival:  Comments: Hold for triage 1 

## 2018-05-31 NOTE — ED Triage Notes (Signed)
Pt reports RLQ pain which started earlier today.  Denies n/v/d but reports decreased appetite.  He reports being admitted July of last year for acute appendicitis, no surgical intervention performed.  He was placed on abx therapy.  He is attributing the pain with it.  He is A&O x 4, in NAD.

## 2018-05-31 NOTE — H&P (Signed)
[name] is an 73 y.o. male.    General Surgery [facility]  Chief Complaint: recurrent acute appendicitis  HPI: Patient is a 73 year old male who presents to the emergency department with 24-hour history of right lower quadrant abdominal pain.  Evaluation includes a CBC which has a normal white blood cell count.  Patient is afebrile.  Vital signs are stable.  Clinical exam shows tenderness in the right lower quadrant.  CT scan of the abdomen demonstrates findings consistent with acute appendicitis.  General surgery is called for evaluation and management.  Patient was admitted in August 2019 with similar clinical scenario.  Patient was diagnosed with acute appendicitis.  However, the patient does have a significant cardiac history and was felt to be high risk for surgical intervention.  Patient was also on Plavix.  Patient was managed medically with antibiotics and improved rapidly.  Patient became asymptomatic upon discharge.  He has had no recurrent symptoms until yesterday.  Only prior abdominal surgery was a laparoscopic procedure for removal of kidney stone.  Patient has a significant cardiac history.  He is followed by Dr. Jeanella Cara.  He did have a recent cardiac catheterization approximately 1 month ago.  Patient is currently on Plavix - last dose 2/19.  Patient did take two ASA tablets at 3 PM today per wife.  Patient is accompanied by his wife in the emergency department.  Patient states that he would like to undergo surgery for appendectomy during this hospitalization.  Past Medical History:  Diagnosis Date  . Anxiety   . CAD (coronary artery disease) of artery bypass graft 12/09/2013  . Chronic lower back pain   . Coronary artery disease   . Dementia (HCC)    "step dementia; from mini strokes in 2018" (11/08/2017)  . Depression   . Diabetes mellitus    "totally diet controlled" (11/08/2017)  . Hearing loss    wears bilateral hearing aids  . High  cholesterol   . History of kidney stones   . Hypertension   . MI (myocardial infarction) (HCC) [phone]   (several)  . On home oxygen therapy    "2L when he sleeps at night" (11/08/2017)  . Osteoarthritis of both knees    "both knees need replaced" (11/08/2017)  . Sleep apnea    "mild; uses oxygen" (11/08/2017)  . Stroke [facility]) 2018   "several silent ones; a little memory loss; step dementia" (11/08/2017)    Past Surgical History:  Procedure Laterality Date  . 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 CARDIAC CATHETERIZATION     "many"  . CAROTID ENDARTERECTOMY Left   . CORONARY ARTERY BYPASS GRAFT  1996   5 vessels  . CORONARY/GRAFT ANGIOGRAPHY N/A 05/01/2018   Procedure: CORONARY/GRAFT ANGIOGRAPHY;  Surgeon: Yates Decamp, MD;  Location: [facility] INVASIVE CV LAB;  Service: Cardiovascular;  Laterality: N/A;  . FRACTURE SURGERY    . KIDNEY STONE SURGERY  ~ 2014   "laparoscopic, robotic OR"  . LOWER EXTREMITY ANGIOGRAPHY Bilateral 05/01/2018   Procedure: LOWER EXTREMITY ANGIOGRAPHY;  Surgeon: Yates Decamp, MD;  Location: [facility] INVASIVE CV LAB;  Service: Cardiovascular;  Laterality: Bilateral;  . PERIPHERAL VASCULAR ATHERECTOMY Left 05/01/2018   Procedure: PERIPHERAL VASCULAR ATHERECTOMY;  Surgeon: Yates Decamp, MD;  Location: [facility] INVASIVE CV LAB;  Service: Cardiovascular;  Laterality: Left;  SFA  . PERIPHERAL VASCULAR BALLOON ANGIOPLASTY  05/01/2018   Procedure: PERIPHERAL VASCULAR BALLOON ANGIOPLASTY;  Surgeon: Yates DecampGanji, Jay, MD;  [facility];  Service: Cardiovascular;;  . WRIST FRACTURE SURGERY Left 2001  . WRIST SURGERY      Family History  Problem Relation Age of Onset  . Heart disease Mother   . Liver cancer Other   . Parkinson's disease Other   . Heart disease Brother   . Parkinsonism Sister   . Cancer Sister    Social History:  reports that he quit smoking about 34 years ago. His smoking use included cigarettes. He  has a 40.00 pack-year smoking history. He has never used smokeless tobacco. He reports previous alcohol use. He reports current drug use. Drug: Marijuana.  Allergies:  Allergies  Allergen Reactions  . Penicillins Other (See Comments)    DID THE REACTION INVOLVE: Swelling of the face/tongue/throat, SOB, or low BP? Unknown Sudden or severe rash/hives, skin peeling, or the inside of the mouth or nose? Unknown Did it require medical treatment? Unknown When did it last happen?70+ years ago If all above answers are "NO", may proceed with cephalosporin use.     (Not in a hospital admission)   Results for orders placed or performed during the hospital encounter of 05/31/18 (from the past 48 hour(s))  Lipase, blood     Status: None   Collection Time: 05/31/18  6:17 PM  Result Value Ref Range   Lipase 38 11 - 51 U/L    Comment: Performed at [facility], [address] [place], [place] [address]  Comprehensive metabolic panel     Status: Abnormal   Collection Time: 05/31/18  6:17 PM  Result Value Ref Range   Sodium 137 135 - 145 mmol/L   Potassium 4.1 3.5 - 5.1 mmol/L   Chloride 102 98 - 111 mmol/L   CO2 27 22 - 32 mmol/L   Glucose, Bld 123 (H) 70 - 99 mg/dL   BUN 34 (H) 8 - 23 mg/dL   Creatinine, Ser 8.291.48 (H) 0.61 - 1.24 mg/dL   Calcium 9.2 8.9 - 56.210.3 mg/dL   Total Protein 7.0 6.5 - 8.1 g/dL   Albumin 4.0 3.5 - 5.0 g/dL   AST 26 15 - 41 U/L   ALT 21 0 - 44 U/L   Alkaline Phosphatase 31 (L) 38 - 126 U/L   Total Bilirubin 0.7 0.3 - 1.2 mg/dL   GFR calc non Af Amer 46 (L) >60 mL/min   GFR calc Af Amer 53 (L) >60 mL/min   Anion gap 8 5 - 15    Comment: Performed at [facility], [address] [place], [place] [address]  CBC     Status: Abnormal   Collection Time: 05/31/18  6:17 PM  Result Value Ref Range   WBC 8.2 4.0 - 10.5 K/uL   RBC 3.85 (L) 4.22 - 5.81 MIL/uL   Hemoglobin 12.2 (L) 13.0 - 17.0 g/dL   HCT 57.836.6 (L) 46.939.0 - 62.952.0 %    MCV 95.1 80.0 - 100.0 fL   MCH 31.7 26.0 - 34.0 pg   MCHC 33.3 30.0 - 36.0 g/dL   RDW 52.814.1 41.311.5 - 24.415.5 %   Platelets 177 150 - 400 K/uL   nRBC 0.0 0.0 - 0.2 %    Comment: Performed at [facility], [address] [place], [place] [address]  Urinalysis, Routine w reflex microscopic     Status: Abnormal   Collection Time: 05/31/18  8:03 PM  Result Value Ref Range   Color, Urine  YELLOW YELLOW   APPearance CLEAR CLEAR   Specific Gravity, Urine 1.031 (H) 1.005 - 1.030   pH 5.0 5.0 - 8.0   Glucose, UA NEGATIVE NEGATIVE mg/dL   Hgb urine dipstick NEGATIVE NEGATIVE   Bilirubin Urine NEGATIVE NEGATIVE   Ketones, ur 5 (A) NEGATIVE mg/dL   Protein, ur NEGATIVE NEGATIVE mg/dL   Nitrite NEGATIVE NEGATIVE   Leukocytes,Ua TRACE (A) NEGATIVE   RBC / HPF 0-5 0 - 5 RBC/hpf   WBC, UA 6-10 0 - 5 WBC/hpf   Bacteria, UA NONE SEEN NONE SEEN   Mucus PRESENT    Hyaline Casts, UA PRESENT     Comment: Performed at [facility], [address] [place], [place] [address]   Ct Abdomen Pelvis W Contrast  Result Date: 05/31/2018 CLINICAL DATA:  73 year old male with right lower quadrant abdominal pain for 1 day. EXAM: CT ABDOMEN AND PELVIS WITH CONTRAST TECHNIQUE: Multidetector CT imaging of the abdomen and pelvis was performed using the standard protocol following bolus administration of intravenous contrast. CONTRAST:  100mL ISOVUE-300 IOPAMIDOL (ISOVUE-300) INJECTION 61% COMPARISON:  CT Abdomen and Pelvis 11/08/2017 and earlier. FINDINGS: Lower chest: Prior sternotomy. Evidence of CABG. Mild cardiomegaly. No pericardial effusion. Negative lung bases. Hepatobiliary: Negative liver and gallbladder. Pancreas: Negative. Spleen: Negative. Adrenals/Urinary Tract: Normal adrenal glands. Mild asymmetric right renal atrophy but preserved bilateral renal enhancement and contrast excretion. No hydronephrosis or hydroureter. Unremarkable urinary bladder. Stomach/Bowel: Negative rectum.  Mildly redundant sigmoid colon with moderate diverticulosis. No active inflammation. Lesser diverticula in the descending colon, no active inflammation. Redundant but otherwise negative transverse colon. The cecum is on a lax mesentery in the anterior abdomen the appendix is dilated with wall thickening, hyperenhancement, and regional fat stranding. There is trace dependent fluid. The terminal ileum is negative. Appendix: Location: Posterolateral to the cecum on series 2, image 48. Diameter: 16-18 millimeters. Appendicolith: None identified. Mucosal hyper-enhancement: Present Extraluminal gas: Negative. Periappendiceal collection: Negative. No dilated small bowel. Decompressed stomach. Small duodenum diverticulum in the midline on series 2, image 34. No free air. Vascular/Lymphatic: Aortoiliac calcified atherosclerosis. Major arterial structures are patent. Portal venous system is patent. Reproductive: Negative. Other: No pelvic free fluid. Musculoskeletal: Prior sternotomy. Advanced lumbar disc degeneration. No acute osseous abnormality identified. IMPRESSION: 1. Positive for Acute Appendicitis. Dilated appendix with regional inflammation and trace reactive appearing fluid. Negative for perforation or abscess. 2. Mild right renal atrophy. Electronically Signed   By: Odessa FlemingH  Hall [name]   On: 05/31/2018 19:52    Review of Systems  Constitutional: Negative for chills, diaphoresis and fever.  HENT: Negative.   Eyes: Negative.   Respiratory: Negative.   Cardiovascular: Negative.   Gastrointestinal: Positive for abdominal pain (RLQ). Negative for constipation, diarrhea, nausea and vomiting.  Genitourinary: Negative.   Musculoskeletal: Negative.   Skin: Negative.   Neurological: Negative.   Endo/Heme/Allergies: Negative.   Psychiatric/Behavioral: Negative.     Blood pressure (!) 145/67, pulse 66, temperature 97.7 F (36.5 C), temperature source Oral, resp. rate 18, height 5\' 9"  (1.753 m), weight 99.8 kg, SpO2  100 %. Physical Exam  Constitutional: He is oriented to person, place, and time. He appears well-developed and well-nourished. No distress.  HENT:  Head: Normocephalic and atraumatic.  Right Ear: External ear normal.  Left Ear: External ear normal.  Mouth/Throat: No oropharyngeal exudate.  Eyes: Pupils are equal, round, and reactive to light. Conjunctivae are normal. No scleral icterus.  Neck: Normal range of motion. Neck supple. No tracheal deviation present. No thyromegaly  present.  Cardiovascular: Normal rate, regular rhythm and normal heart sounds.  No murmur heard. Respiratory: Effort normal and breath sounds normal. No respiratory distress. He has no wheezes. He has no rales.  GI: Soft. He exhibits no distension and no mass. There is abdominal tenderness (RLQ). There is guarding (RLQ).  Musculoskeletal: Normal range of motion.        General: No deformity or edema.  Neurological: He is alert and oriented to person, place, and time.  Skin: Skin is warm and dry. He is not diaphoretic.  Psychiatric: He has a normal mood and affect. His behavior is normal.     Assessment/Plan Acute appendicitis, recurrent  Admit to surgical service  Allow clear liquid diet tonight  Hold Plavix, ASA  IV abx's initiated in ER - Rocephin & Flagyl - continue  Will need cardiology consultation in AM 2/21 - Dr. Yates Decamp.  Will likely require appendectomy during this admission when off anticoagulants and cleared by cardiology.  Darnell Level, MD [facility] Office: [phone]    Darnell Level, MD 05/31/2018, 9:28 PM

## 2018-05-31 NOTE — ED Notes (Signed)
Pt made aware urine specimen is needed.  

## 2018-05-31 NOTE — ED Notes (Signed)
ED TO INPATIENT HANDOFF REPORT  Name/Age/Gender [name] 73 y.o. male  Code Status    Code Status Orders  (From admission, onward)         Start     Ordered   05/31/18 2140  Full code  Continuous     05/31/18 2142        Code Status History    Date Active Date Inactive Code Status Order ID Comments User Context   05/01/2018 1020 05/01/2018 1815 Full Code [id number]  Elder Negus, MD Inpatient   11/08/2017 1934 11/12/2017 1650 Full Code [id number]  Briscoe Deutscher, MD ED   03/29/2017 2205 03/30/2017 1619 Full Code [id number]  Clydie Braun, MD ED      Home/SNF/Other Home  Chief Complaint side pain   Level of Care/Admitting Diagnosis ED Disposition    ED Disposition Condition Comment   Admit  Hospital Area: [facility] [[id number]]  Level of Care: Med-Surg [16]  Diagnosis: Acute appendicitis [623762]  Admitting Physician: CCS, MD [[id number]]  Attending Physician: CCS, MD [[id number]]  Estimated length of stay: 3 - 4 days  Certification:: I certify this patient will need inpatient services for at least 2 midnights  Bed request comments: 5 west  PT Class (Do Not Modify): Inpatient [101]  PT Acc Code (Do Not Modify): Private [1]       Medical History Past Medical History:  Diagnosis Date  . Anxiety   . CAD (coronary artery disease) of artery bypass graft 12/09/2013  . Chronic lower back pain   . Coronary artery disease   . Dementia (HCC)    "step dementia; from mini strokes in 2018" (11/08/2017)  . Depression   . Diabetes mellitus    "totally diet controlled" (11/08/2017)  . Hearing loss    wears bilateral hearing aids  . High cholesterol   . History of kidney stones   . Hypertension   . MI (myocardial infarction) (HCC) [phone]   (several)  . On home oxygen therapy    "2L when he sleeps at night" (11/08/2017)  . Osteoarthritis of both knees    "both knees need replaced" (11/08/2017)  . Sleep apnea    "mild; uses oxygen" (11/08/2017)  .  Stroke [facility]) 2018   "several silent ones; a little memory loss; step dementia" (11/08/2017)    Allergies Allergies  Allergen Reactions  . Penicillins Other (See Comments)    DID THE REACTION INVOLVE: Swelling of the face/tongue/throat, SOB, or low BP? Unknown Sudden or severe rash/hives, skin peeling, or the inside of the mouth or nose? Unknown Did it require medical treatment? Unknown When did it last happen?70+ years ago If all above answers are "NO", may proceed with cephalosporin use.     IV Location/Drains/Wounds Patient Lines/Drains/Airways Status   Active Line/Drains/Airways    Name:   Placement date:   Placement time:   Site:   Days:   Peripheral IV 05/31/18 Left;Upper Arm   05/31/18    1812    Arm   less than 1          Labs/Imaging Results for orders placed or performed during the hospital encounter of 05/31/18 (from the past 48 hour(s))  Lipase, blood     Status: None   Collection Time: 05/31/18  6:17 PM  Result Value Ref Range   Lipase 38 11 - 51 U/L    Comment: Performed at [facility], [address] [place], [place] [address]  Comprehensive metabolic  panel     Status: Abnormal   Collection Time: 05/31/18  6:17 PM  Result Value Ref Range   Sodium 137 135 - 145 mmol/L   Potassium 4.1 3.5 - 5.1 mmol/L   Chloride 102 98 - 111 mmol/L   CO2 27 22 - 32 mmol/L   Glucose, Bld 123 (H) 70 - 99 mg/dL   BUN 34 (H) 8 - 23 mg/dL   Creatinine, Ser 2.131.48 (H) 0.61 - 1.24 mg/dL   Calcium 9.2 8.9 - 08.610.3 mg/dL   Total Protein 7.0 6.5 - 8.1 g/dL   Albumin 4.0 3.5 - 5.0 g/dL   AST 26 15 - 41 U/L   ALT 21 0 - 44 U/L   Alkaline Phosphatase 31 (L) 38 - 126 U/L   Total Bilirubin 0.7 0.3 - 1.2 mg/dL   GFR calc non Af Amer 46 (L) >60 mL/min   GFR calc Af Amer 53 (L) >60 mL/min   Anion gap 8 5 - 15    Comment: Performed at [facility], [address] [place], [place] [address]  CBC     Status: Abnormal   Collection Time: 05/31/18   6:17 PM  Result Value Ref Range   WBC 8.2 4.0 - 10.5 K/uL   RBC 3.85 (L) 4.22 - 5.81 MIL/uL   Hemoglobin 12.2 (L) 13.0 - 17.0 g/dL   HCT 96.236.6 (L) 95.239.0 - 84.152.0 %   MCV 95.1 80.0 - 100.0 fL   MCH 31.7 26.0 - 34.0 pg   MCHC 33.3 30.0 - 36.0 g/dL   RDW 32.414.1 40.111.5 - 02.715.5 %   Platelets 177 150 - 400 K/uL   nRBC 0.0 0.0 - 0.2 %    Comment: Performed at [facility], [address] [place], [place] [address]  Urinalysis, Routine w reflex microscopic     Status: Abnormal   Collection Time: 05/31/18  8:03 PM  Result Value Ref Range   Color, Urine YELLOW YELLOW   APPearance CLEAR CLEAR   Specific Gravity, Urine 1.031 (H) 1.005 - 1.030   pH 5.0 5.0 - 8.0   Glucose, UA NEGATIVE NEGATIVE mg/dL   Hgb urine dipstick NEGATIVE NEGATIVE   Bilirubin Urine NEGATIVE NEGATIVE   Ketones, ur 5 (A) NEGATIVE mg/dL   Protein, ur NEGATIVE NEGATIVE mg/dL   Nitrite NEGATIVE NEGATIVE   Leukocytes,Ua TRACE (A) NEGATIVE   RBC / HPF 0-5 0 - 5 RBC/hpf   WBC, UA 6-10 0 - 5 WBC/hpf   Bacteria, UA NONE SEEN NONE SEEN   Mucus PRESENT    Hyaline Casts, UA PRESENT     Comment: Performed at [facility], [address] [place], [place] [address]   Ct Abdomen Pelvis W Contrast  Result Date: 05/31/2018 CLINICAL DATA:  [age] year old male with right lower quadrant abdominal pain for 1 day. EXAM: CT ABDOMEN AND PELVIS WITH CONTRAST TECHNIQUE: Multidetector CT imaging of the abdomen and pelvis was performed using the standard protocol following bolus administration of intravenous contrast. CONTRAST:  100mL ISOVUE-300 IOPAMIDOL (ISOVUE-300) INJECTION 61% COMPARISON:  CT Abdomen and Pelvis 11/08/2017 and earlier. FINDINGS: Lower chest: Prior sternotomy. Evidence of CABG. Mild cardiomegaly. No pericardial effusion. Negative lung bases. Hepatobiliary: Negative liver and gallbladder. Pancreas: Negative. Spleen: Negative. Adrenals/Urinary Tract: Normal adrenal glands. Mild asymmetric right renal  atrophy but preserved bilateral renal enhancement and contrast excretion. No hydronephrosis or hydroureter. Unremarkable urinary bladder. Stomach/Bowel: Negative rectum. Mildly redundant sigmoid colon with moderate diverticulosis. No active inflammation. Lesser diverticula  in the descending colon, no active inflammation. Redundant but otherwise negative transverse colon. The cecum is on a lax mesentery in the anterior abdomen the appendix is dilated with wall thickening, hyperenhancement, and regional fat stranding. There is trace dependent fluid. The terminal ileum is negative. Appendix: Location: Posterolateral to the cecum on series 2, image 48. Diameter: 16-18 millimeters. Appendicolith: None identified. Mucosal hyper-enhancement: Present Extraluminal gas: Negative. Periappendiceal collection: Negative. No dilated small bowel. Decompressed stomach. Small duodenum diverticulum in the midline on series 2, image 34. No free air. Vascular/Lymphatic: Aortoiliac calcified atherosclerosis. Major arterial structures are patent. Portal venous system is patent. Reproductive: Negative. Other: No pelvic free fluid. Musculoskeletal: Prior sternotomy. Advanced lumbar disc degeneration. No acute osseous abnormality identified. IMPRESSION: 1. Positive for Acute Appendicitis. Dilated appendix with regional inflammation and trace reactive appearing fluid. Negative for perforation or abscess. 2. Mild right renal atrophy. Electronically Signed   By: Odessa Fleming [name]   On: 05/31/2018 19:52   None  Pending Labs Unresulted Labs (From admission, onward)   None      Vitals/Pain Today's Vitals   05/31/18 1732 05/31/18 1736 05/31/18 1813 05/31/18 2049  BP: 136/61   (!) 145/67  Pulse: 70   66  Resp: 16   18  Temp: 97.7 F (36.5 C)     TempSrc: Oral     SpO2: 100%   100%  Weight: 99.8 kg     Height: 5\' 9"  (1.753 m)     PainSc:  4  4      Isolation Precautions No active isolations  Medications Medications  sodium  chloride flush (NS) 0.9 % injection 3 mL (has no administration in time range)  0.9 %  sodium chloride infusion ( Intravenous New Bag/Given 05/31/18 2131)  dextrose 5 % and 0.45 % NaCl with KCl 20 mEq/L infusion (has no administration in time range)  cefTRIAXone (ROCEPHIN) 2 g in sodium chloride 0.9 % 100 mL IVPB (has no administration in time range)    And  metroNIDAZOLE (FLAGYL) IVPB 500 mg (has no administration in time range)  acetaminophen (TYLENOL) tablet 650 mg (has no administration in time range)    Or  acetaminophen (TYLENOL) suppository 650 mg (has no administration in time range)  oxyCODONE (Oxy IR/ROXICODONE) immediate release tablet 5-10 mg (has no administration in time range)  ondansetron (ZOFRAN-ODT) disintegrating tablet 4 mg (has no administration in time range)    Or  ondansetron (ZOFRAN) injection 4 mg (has no administration in time range)  ALPRAZolam (XANAX) tablet 0.5 mg (has no administration in time range)  carvedilol (COREG) tablet 25 mg (has no administration in time range)  DULoxetine (CYMBALTA) DR capsule 60 mg (has no administration in time range)  isosorbide mononitrate (IMDUR) 24 hr tablet 60 mg (has no administration in time range)  losartan (COZAAR) tablet 25 mg (has no administration in time range)  nitroGLYCERIN (NITROSTAT) SL tablet 0.4 mg (has no administration in time range)  ranolazine (RANEXA) 12 hr tablet 1,000 mg (has no administration in time range)  ALPRAZolam (XANAX) tablet 0.5 mg (has no administration in time range)  iopamidol (ISOVUE-300) 61 % injection 100 mL (100 mLs Intravenous Contrast Given 05/31/18 1923)  cefTRIAXone (ROCEPHIN) 2 g in sodium chloride 0.9 % 100 mL IVPB (2 g Intravenous New Bag/Given 05/31/18 2053)    And  metroNIDAZOLE (FLAGYL) IVPB 500 mg (500 mg Intravenous New Bag/Given 05/31/18 2053)    Mobility walks with person assist

## 2018-06-01 ENCOUNTER — Telehealth: Payer: Self-pay | Admitting: Cardiology

## 2018-06-01 DIAGNOSIS — I25810 Atherosclerosis of coronary artery bypass graft(s) without angina pectoris: Secondary | ICD-10-CM

## 2018-06-01 DIAGNOSIS — K3580 Unspecified acute appendicitis: Principal | ICD-10-CM

## 2018-06-01 DIAGNOSIS — Z7189 Other specified counseling: Secondary | ICD-10-CM

## 2018-06-01 DIAGNOSIS — I350 Nonrheumatic aortic (valve) stenosis: Secondary | ICD-10-CM

## 2018-06-01 DIAGNOSIS — I739 Peripheral vascular disease, unspecified: Secondary | ICD-10-CM

## 2018-06-01 LAB — GLUCOSE, CAPILLARY
Glucose-Capillary: 155 mg/dL — ABNORMAL HIGH (ref 70–99)
Glucose-Capillary: 111 mg/dL — ABNORMAL HIGH (ref 70–99)
Glucose-Capillary: 120 mg/dL — ABNORMAL HIGH (ref 70–99)

## 2018-06-01 MED ORDER — INSULIN ASPART 100 UNIT/ML ~~LOC~~ SOLN: 0.0000 [IU] | Freq: Three times a day (TID) | SUBCUTANEOUS | Status: DC

## 2018-06-01 MED ORDER — INSULIN ASPART 100 UNIT/ML ~~LOC~~ SOLN: 0.0000 [IU] | Freq: Every day | SUBCUTANEOUS | Status: DC

## 2018-06-01 MED ORDER — ASPIRIN EC 81 MG PO TBEC
81.0000 mg | DELAYED_RELEASE_TABLET | Freq: Every day | ORAL | Status: DC
  Administered 2018-06-01 – 2018-06-03 (×3): 81 mg via ORAL
  Filled 2018-06-01 (×3): qty 1

## 2018-06-01 NOTE — Consult Note (Signed)
CARDIOLOGY CONSULT NOTE  Patient ID: [name] MRN: [id number] DOB/AGE: September 10, 1945 73 y.o.  Admit date: 05/31/2018 Referring Physician  [facility] Primary Physician:  [name], MD Reason for Consultation  Pre-op risk stratification  HPI:   73 y/o Caucasian male with CAD s/p CABG 2006 (LIMA-LAD, SVG-OM1, occluded SVG-PDA, SVG-D1), PAD s/p left carotid endarterectomy, s/p recent complex atherectomy and successful directional atherectomy with HawkOne LS and InPact Admiral DCB 5.0 X 250 mm balloon in 04/2018, mild aortic stenosis, h/o TIA.  Patient is now admitted with recurrent acute appendicitis. Appendectomy is being considered. Patient had similar presentation in 11/2017. He was medically treated at that time. Since then, he has had cardiac catheterization, details below. No critical lesions amenable to revascularization were identified. Patient is on medical therapy for CAD and PAD. He has mild aortic stenosis on last echocardiogram in 11/2016.   Past Medical History:  Diagnosis Date  . Anxiety   . CAD (coronary artery disease) of artery bypass graft 12/09/2013  . Chronic lower back pain   . Coronary artery disease   . Dementia (HCC)    "step dementia; from mini strokes in 2018" (11/08/2017)  . Depression   . Diabetes mellitus    "totally diet controlled" (11/08/2017)  . Hearing loss    wears bilateral hearing aids  . High cholesterol   . History of kidney stones   . Hypertension   . MI (myocardial infarction) (HCC) [phone]   (several)  . On home oxygen therapy    "2L when he sleeps at night" (11/08/2017)  . Osteoarthritis of both knees    "both knees need replaced" (11/08/2017)  . Sleep apnea    "mild; uses oxygen" (11/08/2017)  . Stroke [facility]) 2018   "several silent ones; a little memory loss; step dementia" (11/08/2017)     Past Surgical History:  Procedure Laterality Date  . 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 CARDIAC CATHETERIZATION     "many"  . CAROTID ENDARTERECTOMY Left   . CORONARY ARTERY BYPASS GRAFT  1996   5 vessels  . CORONARY/GRAFT ANGIOGRAPHY N/A 05/01/2018   Procedure: CORONARY/GRAFT ANGIOGRAPHY;  Surgeon: Yates Decamp, MD;  Location: [facility] INVASIVE CV LAB;  Service: Cardiovascular;  Laterality: N/A;  . FRACTURE SURGERY    . KIDNEY STONE SURGERY  ~ 2014   "laparoscopic, robotic OR"  . LOWER EXTREMITY ANGIOGRAPHY Bilateral 05/01/2018   Procedure: LOWER EXTREMITY ANGIOGRAPHY;  Surgeon: Yates Decamp, MD;  Location: [facility] INVASIVE CV LAB;  Service: Cardiovascular;  Laterality: Bilateral;  . PERIPHERAL VASCULAR ATHERECTOMY Left 05/01/2018   Procedure: PERIPHERAL VASCULAR ATHERECTOMY;  Surgeon: Yates Decamp, MD;  Location: [facility] INVASIVE CV LAB;  Service: Cardiovascular;  Laterality: Left;  SFA  . PERIPHERAL VASCULAR BALLOON ANGIOPLASTY  05/01/2018   Procedure: PERIPHERAL VASCULAR BALLOON ANGIOPLASTY;  Surgeon: Yates Decamp, MD;  Location: [facility] INVASIVE CV LAB;  Service: Cardiovascular;;  . WRIST FRACTURE SURGERY Left 2001  . WRIST SURGERY       Family History  Problem Relation Age of Onset  . Heart disease Mother   . Liver cancer Other   . Parkinson's disease Other   . Heart disease Brother   . Parkinsonism Sister   . Cancer Sister      Social History: Social History   Socioeconomic History  . Marital status: Married    Spouse name: [name]  . Number of children: 0  . Years of  education: Grad. scho  . Highest education level: Not on file  Occupational History  . Not on file  Social Needs  . Financial resource strain: Not on file  . Food insecurity:    Worry: Not on file    Inability: Not on file  . Transportation needs:    Medical: Not on file    Non-medical: Not on file  Tobacco Use  . Smoking status: Former Smoker    Packs/day: 2.00    Years: 20.00    Pack years: 40.00    Types: Cigarettes    Last attempt to quit: 10/20/1983    Years since quitting: 34.6   . Smokeless tobacco: Never Used  Substance and Sexual Activity  . Alcohol use: Not Currently  . Drug use: Yes    Types: Marijuana    Comment: 11/08/2017 "couple times/month"  . Sexual activity: Not Currently  Lifestyle  . Physical activity:    Days per week: Not on file    Minutes per session: Not on file  . Stress: Not on file  Relationships  . Social connections:    Talks on phone: Not on file    Gets together: Not on file    Attends religious service: Not on file    Active member of club or organization: Not on file    Attends meetings of clubs or organizations: Not on file    Relationship status: Not on file  . Intimate partner violence:    Fear of current or ex partner: Not on file    Emotionally abused: Not on file    Physically abused: Not on file    Forced sexual activity: Not on file  Other Topics Concern  . Not on file  Social History Narrative   Patient is married Medical illustrator([name]) and lives at home with his wife.   Patient is retired.   Patient has a Naval architectGraduate school education.   Patient is right-handed.   Patient drinks 3-4 cups of coffee and three cups of soda/tea per day.     Medications Prior to Admission  Medication Sig Dispense Refill Last Dose  . ALPRAZolam (XANAX) 0.5 MG tablet Take 1 tablet (0.5 mg total) at bedtime as needed by mouth. (Patient taking differently: Take 0.5 mg by mouth See admin instructions. Take 0.5 mg at bedtime, may take an additional 0.5 mg dose as needed for anxiety) 45 tablet 0 05/30/2018 at Unknown time  . aspirin EC 81 MG tablet Take 81 mg by mouth at bedtime.   05/31/2018 at 3:00pm  . atorvastatin (LIPITOR) 20 MG tablet Take 20 mg by mouth at bedtime.   05/31/2018 at 00:00  . carvedilol (COREG) 25 MG tablet Take 25 mg by mouth 2 (two) times daily with a meal.   05/31/2018 at 00:00  . clopidogrel (PLAVIX) 75 MG tablet Take 75 mg by mouth every morning.    05/30/2018 at 12:00pm  . DULoxetine (CYMBALTA) 60 MG capsule Take 60 mg by mouth at  bedtime.   05/31/2018 at 00:00  . fenofibrate 160 MG tablet Take 160 mg by mouth daily.   05/31/2018 at 12:00pm  . isosorbide mononitrate (IMDUR) 30 MG 24 hr tablet Take 60 mg by mouth 2 (two) times daily.   05/31/2018 at 12:00am  . LINZESS 145 MCG CAPS capsule Take 145 mcg by mouth daily as needed (constipation).   12 05/30/2018 at Unknown time  . losartan (COZAAR) 25 MG tablet Take 0.5 tablets (12.5 mg total) by mouth daily. (Patient taking differently:  Take 25 mg by mouth daily. )   05/30/2018 at 12:00pm  . Multiple Vitamins-Minerals (MULTIVITAMIN PO) Take 1 tablet by mouth at bedtime.    05/31/2018 at 00:00  . nitroGLYCERIN (NITROSTAT) 0.4 MG SL tablet Place 1 tablet (0.4 mg total) under the tongue every 5 (five) minutes as needed for chest pain. 25 tablet 2 05/30/2018 at Unknown time  . ranolazine (RANEXA) 1000 MG SR tablet Take 1,000 mg by mouth 2 (two) times daily.    05/31/2018 at 00:00    Review of Systems  Constitution: Negative for decreased appetite, malaise/fatigue, weight gain and weight loss.  HENT: Negative for congestion.   Eyes: Negative for visual disturbance.  Cardiovascular: Negative for chest pain, claudication, dyspnea on exertion, leg swelling, palpitations and syncope.  Respiratory: Negative for shortness of breath.   Endocrine: Negative for cold intolerance.  Hematologic/Lymphatic: Does not bruise/bleed easily.  Skin: Negative for itching and rash.  Musculoskeletal: Positive for joint pain. Negative for myalgias.  Gastrointestinal: Positive for abdominal pain. Negative for nausea and vomiting.  Genitourinary: Negative for dysuria.  Neurological: Negative for dizziness and weakness.  Psychiatric/Behavioral: The patient is not nervous/anxious.   All other systems reviewed and are negative.     Physical Exam: Physical Exam  Constitutional: He is oriented to person, place, and time. He appears well-developed and well-nourished. No distress.  HENT:  Head: Normocephalic  and atraumatic.  Eyes: Pupils are equal, round, and reactive to light. Conjunctivae are normal.  Neck: No JVD present.  Cardiovascular: Normal rate and regular rhythm.  Murmur (III/VI crescendo murmur RUSB) heard. Pulses:      Carotid pulses are on the right side with bruit and on the left side with bruit.      Dorsalis pedis pulses are 1+ on the right side and 1+ on the left side.  Pulmonary/Chest: Effort normal and breath sounds normal. He has no wheezes. He has no rales.  Abdominal: Soft. Bowel sounds are normal. There is no rebound.  Musculoskeletal:        General: No edema.  Lymphadenopathy:    He has no cervical adenopathy.  Neurological: He is alert and oriented to person, place, and time. No cranial nerve deficit.  Skin: Skin is warm and dry.  Psychiatric: He has a normal mood and affect.  Nursing note and vitals reviewed.    Labs:   Lab Results  Component Value Date   WBC 8.2 05/31/2018   HGB 12.2 (L) 05/31/2018   HCT 36.6 (L) 05/31/2018   MCV 95.1 05/31/2018   PLT 177 05/31/2018    Recent Labs  Lab 05/31/18 1817  NA 137  K 4.1  CL 102  CO2 27  BUN 34*  CREATININE 1.48*  CALCIUM 9.2  PROT 7.0  BILITOT 0.7  ALKPHOS 31*  ALT 21  AST 26  GLUCOSE 123*    Lipid Panel     Component Value Date/Time   CHOL 150 03/30/2017 0414   TRIG 208 (H) 03/30/2017 0414   HDL 34 (L) 03/30/2017 0414   CHOLHDL 4.4 03/30/2017 0414   VLDL 42 (H) 03/30/2017 0414   LDLCALC 74 03/30/2017 0414    BNP (last 3 results) No results for input(s): BNP in the last 8760 hours.  HEMOGLOBIN A1C Lab Results  Component Value Date   HGBA1C 5.8 (H) 03/30/2017   MPG 119.76 03/30/2017    Cardiac Panel (last 3 results) No results for input(s): CKTOTAL, CKMB, TROPONINI, RELINDX in the last 8760 hours.  No results  found for: CKTOTAL, CKMB, CKMBINDEX, TROPONINI   TSH No results for input(s): TSH in the last 8760 hours.    Radiology: Ct Abdomen Pelvis W Contrast  Result Date:  05/31/2018 CLINICAL DATA:  73 year old male with right lower quadrant abdominal pain for 1 day. EXAM: CT ABDOMEN AND PELVIS WITH CONTRAST TECHNIQUE: Multidetector CT imaging of the abdomen and pelvis was performed using the standard protocol following bolus administration of intravenous contrast. CONTRAST:  ISOVUE-300 IOPAMIDOL (ISOVUE-300) INJECTION 61% COMPARISON:  CT Abdomen and Pelvis 11/08/2017 and earlier. FINDINGS: Lower chest: Prior sternotomy. Evidence of CABG. Mild cardiomegaly. No pericardial effusion. Negative lung bases. Hepatobiliary: Negative liver and gallbladder. Pancreas: Negative. Spleen: Negative. Adrenals/Urinary Tract: Normal adrenal glands. Mild asymmetric right renal atrophy but preserved bilateral renal enhancement and contrast excretion. No hydronephrosis or hydroureter. Unremarkable urinary bladder. Stomach/Bowel: Negative rectum. Mildly redundant sigmoid colon with moderate diverticulosis. No active inflammation. Lesser diverticula in the descending colon, no active inflammation. Redundant but otherwise negative transverse colon. The cecum is on a lax mesentery in the anterior abdomen the appendix is dilated with wall thickening, hyperenhancement, and regional fat stranding. There is trace dependent fluid. The terminal ileum is negative. Appendix: Location: Posterolateral to the cecum on series 2, image 48. Diameter: 16-18 millimeters. Appendicolith: None identified. Mucosal hyper-enhancement: Present Extraluminal gas: Negative. Periappendiceal collection: Negative. No dilated small bowel. Decompressed stomach. Small duodenum diverticulum in the midline on series 2, image 34. No free air. Vascular/Lymphatic: Aortoiliac calcified atherosclerosis. Major arterial structures are patent. Portal venous system is patent. Reproductive: Negative. Other: No pelvic free fluid. Musculoskeletal: Prior sternotomy. Advanced lumbar disc degeneration. No acute osseous abnormality identified.  IMPRESSION: 1. Positive for Acute Appendicitis. Dilated appendix with regional inflammation and trace reactive appearing fluid. Negative for perforation or abscess. 2. Mild right renal atrophy. Electronically Signed   By: Odessa Fleming [name]   On: 05/31/2018 19:52    Scheduled Meds: . ALPRAZolam  0.5 mg Oral QHS  . carvedilol  25 mg Oral BID WC  . DULoxetine  60 mg Oral QHS  . isosorbide mononitrate  60 mg Oral BID WC  . losartan  25 mg Oral Daily  . ranolazine  1,000 mg Oral BID  . sodium chloride flush  3 mL Intravenous Once   Continuous Infusions: . sodium chloride 100 mL/hr at 06/01/18 0200  . cefTRIAXone (ROCEPHIN)  IV     And  . metronidazole 500 mg (06/01/18 0549)  . dextrose 5 % and 0.45 % NaCl with KCl 20 mEq/L 50 mL/hr at 06/01/18 0600   PRN Meds:.acetaminophen **OR** acetaminophen, ALPRAZolam, nitroGLYCERIN, ondansetron **OR** ondansetron (ZOFRAN) IV, oxyCODONE, traMADol  CARDIAC STUDIES:  EKG: Pending  Echocardiogram 11/2016: Echocardiogram 12/06/2016: Left ventricle cavity is normal in size. Moderate concentric hypertrophy of the left ventricle.  Inferior and inferolateral moderate hypokinesis to akinesis. Doppler evidence of grade I (impaired) diastolic dysfunction.  Visual LVEF 40-45%.   Left atrial cavity is moderately dilated at 4.5 cm. Trace aortic regurgitation. Moderate calcification of the aortic valve annulus. Mild aortic valve leaflet calcification. Mildly restricted aortic valve leaflets. Mild aortic valve stenosis. Aortic valve peak pressure gradient of  30 and mean gradient of  17.5 mmHg, calculated aortic valve area 1.45 cm. Mild (Grade I) mitral regurgitation. Trace tricuspid regurgitation. Unable to estimate PA pressure due to absence/minimal TR signal. Compared to report from Echocardiogram 05/31/2012, no significant change.  Grade 2 diastolic dysfunction.  PV and coronary/bypass angiogram 05/01/2018: LM: Patent LAD: Ostially occluded. Distal 90%, and  severe  prox -mid 95% disease LIMA-LAD: Patent LCx: Ostially occluded SVG-OM2: Patent with ectasia and moderate disease, insertion site size mismatch and does not appear to be stenosis. RCA: Not engaged. Known occluded SVG-RCA: Not engaged. Known occluded SVG-D1: Not engaged. Known occluded  Lt SFA: three calcified 95% lesions Successful directional atherectomy with HawkOne LS and InPact Admiral DCB 5.0 X 250 mm balloon. 0% residual stenosis Distal SFA pseudoaneurysm, and mid Lt Poppliteal tandem 60% lesions-->Medical management Lt PTA occluded  Rt ostial SFA 70% stenosis Rt distal SFA and mid Popliteal 90-95% stenoses.  Right PT occluded.  Staged revascularization subject to patients symptoms on another day. Outpatient ABI in 2-3 weeks.   Assessment & Recommendations:  73 y/o Caucasian male with CAD s/p CABG 2006 (LIMA-LAD, SVG-OM1, occluded SVG-PDA, SVG-D1), PAD s/p left carotid endarterectomy, s/p recent complex atherectomy and successful directional atherectomy with HawkOne LS and InPact Admiral DCB 5.0 X 250 mm balloon in 04/2018, mild aortic stenosis, h/o TIA. Patient is admitted with recurrent acute appendicitis.  Pre-op risk stratification: Known CAD and PAD.  No critical lesions amenable to revascularization were identified on recent coronary and bypass graft angiogram. He underwent LE revascularization in 04/2018 with drug coated balloon. Ideally, I would prefer at least 3-6 months of dual antiplatelet therapy. However, it is reasonable to stop plavix in order to proceed with appendectomy. Continue aspirin, if possible. Resume plavix after the surgery, when deemed safe. Continue baseline antianginal therapy. Recommend statin.   He has mild aortic stenosis on echocardiogram in 11/2016. I do not think this has progressed to critical, based on physical exam. Nonetheless, recommend echocardiogram and EKG (ordered).   Elder Negus, MD 06/01/2018, 11:10 AM [facility] Pager: [phone] Office: [phone] If no answer Cell [phone]

## 2018-06-01 NOTE — Progress Notes (Addendum)
[facility] Progress Note     Subjective: CC: abdominal pain Patient reports pain is slightly decreased from last night. Patient denies nausea. Tolerating CLD.   Objective: Vital signs in last 24 hours: Temp:  [97.7 F (36.5 C)-99.8 F (37.7 C)] 99.8 F (37.7 C) (02/21 0533) Pulse Rate:  [66-74] 74 (02/21 0533) Resp:  [16-18] 18 (02/21 0533) BP: (136-145)/(61-67) 141/66 (02/21 0533) SpO2:  [96 %-100 %] 96 % (02/21 0533) Weight:  [99.8 kg] 99.8 kg (02/20 1732) Last BM Date: 05/30/18  Intake/Output from previous day: 02/20 0701 - 02/21 0700 In: 1179.3 [P.O.:240; I.V.:639.3; IV Piggyback:300] Out: 400 [Urine:400] Intake/Output this shift: No intake/output data recorded.  PE: Gen:  Alert, NAD Card:  Regular rate and rhythm Pulm:  Normal effort, clear to auscultation bilaterally Abd: Soft, focalized TTP in RLQ, no rebound tenderness or guarding, non-distended, +BS Skin: warm and dry, no rashes  Psych: A&Ox3   Lab Results:  Recent Labs    05/31/18 1817  WBC 8.2  HGB 12.2*  HCT 36.6*  PLT 177   BMET Recent Labs    05/31/18 1817  NA 137  K 4.1  CL 102  CO2 27  GLUCOSE 123*  BUN 34*  CREATININE 1.48*  CALCIUM 9.2   PT/INR No results for input(s): LABPROT, INR in the last 72 hours. CMP     Component Value Date/Time   NA 137 05/31/2018 1817   NA 140 10/24/2016 1149   K 4.1 05/31/2018 1817   CL 102 05/31/2018 1817   CO2 27 05/31/2018 1817   GLUCOSE 123 (H) 05/31/2018 1817   BUN 34 (H) 05/31/2018 1817   BUN 22 10/24/2016 1149   CREATININE 1.48 (H) 05/31/2018 1817   CALCIUM 9.2 05/31/2018 1817   PROT 7.0 05/31/2018 1817   PROT 6.8 10/24/2016 1149   ALBUMIN 4.0 05/31/2018 1817   ALBUMIN 4.1 10/24/2016 1149   AST 26 05/31/2018 1817   ALT 21 05/31/2018 1817   ALKPHOS 31 (L) 05/31/2018 1817   BILITOT 0.7 05/31/2018 1817   BILITOT 0.3 10/24/2016 1149   GFRNONAA 46 (L) 05/31/2018 1817   GFRAA 53 (L) 05/31/2018 1817   Lipase     Component  Value Date/Time   LIPASE 38 05/31/2018 1817       Studies/Results: Ct Abdomen Pelvis W Contrast  Result Date: 05/31/2018 CLINICAL DATA:  73 year old male with right lower quadrant abdominal pain for 1 day. EXAM: CT ABDOMEN AND PELVIS WITH CONTRAST TECHNIQUE: Multidetector CT imaging of the abdomen and pelvis was performed using the standard protocol following bolus administration of intravenous contrast. CONTRAST:  ISOVUE-300 IOPAMIDOL (ISOVUE-300) INJECTION 61% COMPARISON:  CT Abdomen and Pelvis 11/08/2017 and earlier. FINDINGS: Lower chest: Prior sternotomy. Evidence of CABG. Mild cardiomegaly. No pericardial effusion. Negative lung bases. Hepatobiliary: Negative liver and gallbladder. Pancreas: Negative. Spleen: Negative. Adrenals/Urinary Tract: Normal adrenal glands. Mild asymmetric right renal atrophy but preserved bilateral renal enhancement and contrast excretion. No hydronephrosis or hydroureter. Unremarkable urinary bladder. Stomach/Bowel: Negative rectum. Mildly redundant sigmoid colon with moderate diverticulosis. No active inflammation. Lesser diverticula in the descending colon, no active inflammation. Redundant but otherwise negative transverse colon. The cecum is on a lax mesentery in the anterior abdomen the appendix is dilated with wall thickening, hyperenhancement, and regional fat stranding. There is trace dependent fluid. The terminal ileum is negative. Appendix: Location: Posterolateral to the cecum on series 2, image 48. Diameter: 16-18 millimeters. Appendicolith: None identified. Mucosal hyper-enhancement: Present Extraluminal gas: Negative. Periappendiceal collection: Negative. No  dilated small bowel. Decompressed stomach. Small duodenum diverticulum in the midline on series 2, image 34. No free air. Vascular/Lymphatic: Aortoiliac calcified atherosclerosis. Major arterial structures are patent. Portal venous system is patent. Reproductive: Negative. Other: No pelvic free  fluid. Musculoskeletal: Prior sternotomy. Advanced lumbar disc degeneration. No acute osseous abnormality identified. IMPRESSION: 1. Positive for Acute Appendicitis. Dilated appendix with regional inflammation and trace reactive appearing fluid. Negative for perforation or abscess. 2. Mild right renal atrophy. Electronically Signed   By: Odessa Fleming [name]   On: 05/31/2018 19:52    Anti-infectives: Anti-infectives (From admission, onward)   Start     Dose/Rate Route Frequency Ordered Stop   06/01/18 2000  cefTRIAXone (ROCEPHIN) 2 g in sodium chloride 0.9 % 100 mL IVPB     2 g 200 mL/hr over 30 Minutes Intravenous Every 24 hours 05/31/18 2142     06/01/18 0600  metroNIDAZOLE (FLAGYL) IVPB 500 mg     500 mg 100 mL/hr over 60 Minutes Intravenous Every 8 hours 05/31/18 2142     05/31/18 2015  cefTRIAXone (ROCEPHIN) 2 g in sodium chloride 0.9 % 100 mL IVPB     2 g 200 mL/hr over 30 Minutes Intravenous  Once 05/31/18 2006 05/31/18 2204   05/31/18 2015  metroNIDAZOLE (FLAGYL) IVPB 500 mg     500 mg 100 mL/hr over 60 Minutes Intravenous  Once 05/31/18 2006 05/31/18 2204       Assessment/Plan Hx of CVA CAD/Hx of MI - s/p cath 1/21, last dose plavix 2/19 HTN HLD T2DM  Sleep apnea - wears O2 at night Anxiety  Recurrent appendicitis - CT 2/20 with dilated appendix and surrounding inflammation - was treated medically for appendicitis back in 11/2017 - continue IV abx  - needs cardiac clearance prior to any operative intervention, I have called Dr. Verl Dicker office  FEN: CLD, IVF VTE: SCDs, holding plavix ID: [email or web address] 2/20>>  LOS: 1 day    Wells Guiles , [facility] 06/01/2018, 10:09 AM Pager: [phone] Consults: [phone]

## 2018-06-01 NOTE — Telephone Encounter (Signed)
Pre op cardiac clearance given for appendectomy

## 2018-06-02 ENCOUNTER — Inpatient Hospital Stay (HOSPITAL_COMMUNITY): Payer: Medicare Other

## 2018-06-02 LAB — CBC
HCT: 34.4 % — ABNORMAL LOW (ref 39.0–52.0)
Hemoglobin: 11.2 g/dL — ABNORMAL LOW (ref 13.0–17.0)
MCH: 31.5 pg (ref 26.0–34.0)
MCHC: 32.6 g/dL (ref 30.0–36.0)
MCV: 96.6 fL (ref 80.0–100.0)
Platelets: 171 10*3/uL (ref 150–400)
RBC: 3.56 MIL/uL — ABNORMAL LOW (ref 4.22–5.81)
RDW: 14 % (ref 11.5–15.5)
WBC: 7.6 10*3/uL (ref 4.0–10.5)
nRBC: 0 % (ref 0.0–0.2)

## 2018-06-02 LAB — SURGICAL PCR SCREEN
MRSA, PCR: NEGATIVE
Staphylococcus aureus: NEGATIVE

## 2018-06-02 LAB — GLUCOSE, CAPILLARY
Glucose-Capillary: 82 mg/dL (ref 70–99)
Glucose-Capillary: 151 mg/dL — ABNORMAL HIGH (ref 70–99)
Glucose-Capillary: 98 mg/dL (ref 70–99)
Glucose-Capillary: 107 mg/dL — ABNORMAL HIGH (ref 70–99)

## 2018-06-02 LAB — ECHOCARDIOGRAM COMPLETE
Height: 69 in
Weight: 3520 oz

## 2018-06-02 LAB — BASIC METABOLIC PANEL
Anion gap: 5 (ref 5–15)
BUN: 22 mg/dL (ref 8–23)
CO2: 26 mmol/L (ref 22–32)
Calcium: 8.6 mg/dL — ABNORMAL LOW (ref 8.9–10.3)
Chloride: 106 mmol/L (ref 98–111)
Creatinine, Ser: 1.46 mg/dL — ABNORMAL HIGH (ref 0.61–1.24)
GFR calc Af Amer: 54 mL/min — ABNORMAL LOW (ref >60)
GFR calc non Af Amer: 47 mL/min — ABNORMAL LOW (ref >60)
Glucose, Bld: 98 mg/dL (ref 70–99)
Potassium: 4.6 mmol/L (ref 3.5–5.1)
Sodium: 137 mmol/L (ref 135–145)

## 2018-06-02 MED ORDER — MUPIROCIN 2 % EX OINT
1.0000 "application " | TOPICAL_OINTMENT | Freq: Two times a day (BID) | CUTANEOUS | Status: DC
  Administered 2018-06-02 – 2018-06-04 (×4): 1 via NASAL
  Filled 2018-06-02: qty 22

## 2018-06-02 NOTE — Progress Notes (Signed)
Subjective/Chief Complaint: Complains of RLQ pain but seems better   Objective: Vital signs in last 24 hours: Temp:  [98.1 F (36.7 C)-98.7 F (37.1 C)] 98.2 F (36.8 C) (02/22 0546) Pulse Rate:  [65-70] 70 (02/22 0546) Resp:  [18-20] 20 (02/22 0546) BP: (100-126)/(61-64) 116/64 (02/22 0546) SpO2:  [100 %] 100 % (02/22 0546) Last BM Date: 05/30/18  Intake/Output from previous day: 02/21 0701 - 02/22 0700 In: 1662.8 [P.O.:680; I.V.:600; IV Piggyback:382.8] Out: 300 [Urine:300] Intake/Output this shift: No intake/output data recorded.  General appearance: alert and cooperative Resp: clear to auscultation bilaterally Cardio: regular rate and rhythm GI: soft, moderate focal tenderness RLQ  Lab Results:  Recent Labs    05/31/18 1817 06/02/18 0343  WBC 8.2 7.6  HGB 12.2* 11.2*  HCT 36.6* 34.4*  PLT 177 171   BMET Recent Labs    05/31/18 1817 06/02/18 0343  NA 137 137  K 4.1 4.6  CL 102 106  CO2 27 26  GLUCOSE 123* 98  BUN 34* 22  CREATININE 1.48* 1.46*  CALCIUM 9.2 8.6*   PT/INR No results for input(s): LABPROT, INR in the last 72 hours. ABG No results for input(s): PHART, HCO3 in the last 72 hours.  Invalid input(s): PCO2, PO2  Studies/Results: Ct Abdomen Pelvis W Contrast  Result Date: 05/31/2018 CLINICAL DATA:  73 year old male with right lower quadrant abdominal pain for 1 day. EXAM: CT ABDOMEN AND PELVIS WITH CONTRAST TECHNIQUE: Multidetector CT imaging of the abdomen and pelvis was performed using the standard protocol following bolus administration of intravenous contrast. CONTRAST:  ISOVUE-300 IOPAMIDOL (ISOVUE-300) INJECTION 61% COMPARISON:  CT Abdomen and Pelvis 11/08/2017 and earlier. FINDINGS: Lower chest: Prior sternotomy. Evidence of CABG. Mild cardiomegaly. No pericardial effusion. Negative lung bases. Hepatobiliary: Negative liver and gallbladder. Pancreas: Negative. Spleen: Negative. Adrenals/Urinary Tract: Normal adrenal glands.  Mild asymmetric right renal atrophy but preserved bilateral renal enhancement and contrast excretion. No hydronephrosis or hydroureter. Unremarkable urinary bladder. Stomach/Bowel: Negative rectum. Mildly redundant sigmoid colon with moderate diverticulosis. No active inflammation. Lesser diverticula in the descending colon, no active inflammation. Redundant but otherwise negative transverse colon. The cecum is on a lax mesentery in the anterior abdomen the appendix is dilated with wall thickening, hyperenhancement, and regional fat stranding. There is trace dependent fluid. The terminal ileum is negative. Appendix: Location: Posterolateral to the cecum on series 2, image 48. Diameter: 16-18 millimeters. Appendicolith: None identified. Mucosal hyper-enhancement: Present Extraluminal gas: Negative. Periappendiceal collection: Negative. No dilated small bowel. Decompressed stomach. Small duodenum diverticulum in the midline on series 2, image 34. No free air. Vascular/Lymphatic: Aortoiliac calcified atherosclerosis. Major arterial structures are patent. Portal venous system is patent. Reproductive: Negative. Other: No pelvic free fluid. Musculoskeletal: Prior sternotomy. Advanced lumbar disc degeneration. No acute osseous abnormality identified. IMPRESSION: 1. Positive for Acute Appendicitis. Dilated appendix with regional inflammation and trace reactive appearing fluid. Negative for perforation or abscess. 2. Mild right renal atrophy. Electronically Signed   By: Odessa Fleming [name]   On: 05/31/2018 19:52    Anti-infectives: Anti-infectives (From admission, onward)   Start     Dose/Rate Route Frequency Ordered Stop   06/01/18 2000  cefTRIAXone (ROCEPHIN) 2 g in sodium chloride 0.9 % 100 mL IVPB     2 g 200 mL/hr over 30 Minutes Intravenous Every 24 hours 05/31/18 2142     06/01/18 0600  metroNIDAZOLE (FLAGYL) IVPB 500 mg     500 mg 100 mL/hr over 60 Minutes Intravenous Every 8 hours 05/31/18 2142  05/31/18  2015  cefTRIAXone (ROCEPHIN) 2 g in sodium chloride 0.9 % 100 mL IVPB     2 g 200 mL/hr over 30 Minutes Intravenous  Once 05/31/18 2006 05/31/18 2204   05/31/18 2015  metroNIDAZOLE (FLAGYL) IVPB 500 mg     500 mg 100 mL/hr over 60 Minutes Intravenous  Once 05/31/18 2006 05/31/18 2204      Assessment/Plan: s/p * No surgery found * Getting echo this am at bedside. Cardiology has cleared. Hold plavix but continue asa Will likely need lap appy during this admission Continue IV abx until surgery  LOS: 2 days    Chevis Pretty III 06/02/2018

## 2018-06-03 ENCOUNTER — Encounter (HOSPITAL_COMMUNITY): Admission: EM | Disposition: A | Discharge status: Home / Self Care | Payer: Self-pay | Source: Home / Self Care

## 2018-06-03 ENCOUNTER — Inpatient Hospital Stay (HOSPITAL_COMMUNITY): Payer: Medicare Other | Admitting: Anesthesiology

## 2018-06-03 HISTORY — PX: LAPAROSCOPIC APPENDECTOMY: SHX408

## 2018-06-03 LAB — GLUCOSE, CAPILLARY
Glucose-Capillary: 81 mg/dL (ref 70–99)
Glucose-Capillary: 105 mg/dL — ABNORMAL HIGH (ref 70–99)
Glucose-Capillary: 118 mg/dL — ABNORMAL HIGH (ref 70–99)
Glucose-Capillary: 204 mg/dL — ABNORMAL HIGH (ref 70–99)

## 2018-06-03 SURGERY — APPENDECTOMY, LAPAROSCOPIC
Anesthesia: General | Site: Abdomen

## 2018-06-03 MED ORDER — FENTANYL CITRATE (PF) 250 MCG/5ML IJ SOLN
INTRAMUSCULAR | Status: AC
  Filled 2018-06-03: qty 5

## 2018-06-03 MED ORDER — MORPHINE SULFATE (PF) 2 MG/ML IV SOLN: 1.0000 mg | INTRAVENOUS | Status: DC | PRN

## 2018-06-03 MED ORDER — ONDANSETRON HCL 4 MG/2ML IJ SOLN: 4.0000 mg | Freq: Once | INTRAMUSCULAR | Status: DC | PRN

## 2018-06-03 MED ORDER — BUPIVACAINE HCL (PF) 0.25 % IJ SOLN
INTRAMUSCULAR | Status: AC
  Filled 2018-06-03: qty 30

## 2018-06-03 MED ORDER — LACTATED RINGERS IV SOLN: INTRAVENOUS | Status: DC

## 2018-06-03 MED ORDER — EPHEDRINE 5 MG/ML INJ
INTRAVENOUS | Status: AC
  Filled 2018-06-03: qty 10

## 2018-06-03 MED ORDER — ACETAMINOPHEN 10 MG/ML IV SOLN
INTRAVENOUS | Status: DC | PRN
  Administered 2018-06-03: 1000 mg via INTRAVENOUS

## 2018-06-03 MED ORDER — ONDANSETRON HCL 4 MG/2ML IJ SOLN
INTRAMUSCULAR | Status: AC
  Filled 2018-06-03: qty 2

## 2018-06-03 MED ORDER — PROPOFOL 10 MG/ML IV BOLUS
INTRAVENOUS | Status: AC
  Filled 2018-06-03: qty 20

## 2018-06-03 MED ORDER — ACETAMINOPHEN 10 MG/ML IV SOLN
INTRAVENOUS | Status: AC
  Filled 2018-06-03: qty 100

## 2018-06-03 MED ORDER — LACTATED RINGERS IV SOLN
INTRAVENOUS | Status: AC | PRN
  Administered 2018-06-03: 1000 mL via INTRAVENOUS

## 2018-06-03 MED ORDER — 0.9 % SODIUM CHLORIDE (POUR BTL) OPTIME
TOPICAL | Status: DC | PRN
  Administered 2018-06-03: 1000 mL

## 2018-06-03 MED ORDER — PHENYLEPHRINE 40 MCG/ML (10ML) SYRINGE FOR IV PUSH (FOR BLOOD PRESSURE SUPPORT)
PREFILLED_SYRINGE | INTRAVENOUS | Status: DC | PRN
  Administered 2018-06-03 (×2): 80 ug via INTRAVENOUS

## 2018-06-03 MED ORDER — ACETAMINOPHEN 10 MG/ML IV SOLN: 1000.0000 mg | Freq: Once | INTRAVENOUS | Status: DC | PRN

## 2018-06-03 MED ORDER — ROCURONIUM BROMIDE 10 MG/ML (PF) SYRINGE
PREFILLED_SYRINGE | INTRAVENOUS | Status: DC | PRN
  Administered 2018-06-03: 40 mg via INTRAVENOUS

## 2018-06-03 MED ORDER — SUGAMMADEX SODIUM 200 MG/2ML IV SOLN
INTRAVENOUS | Status: DC | PRN
  Administered 2018-06-03: 200 mg via INTRAVENOUS

## 2018-06-03 MED ORDER — FENTANYL CITRATE (PF) 100 MCG/2ML IJ SOLN: 25.0000 ug | INTRAMUSCULAR | Status: DC | PRN

## 2018-06-03 MED ORDER — LIDOCAINE 2% (20 MG/ML) 5 ML SYRINGE
INTRAMUSCULAR | Status: AC
  Filled 2018-06-03: qty 5

## 2018-06-03 MED ORDER — PROPOFOL 10 MG/ML IV BOLUS
INTRAVENOUS | Status: DC | PRN
  Administered 2018-06-03: 80 mg via INTRAVENOUS

## 2018-06-03 MED ORDER — ONDANSETRON HCL 4 MG/2ML IJ SOLN
INTRAMUSCULAR | Status: DC | PRN
  Administered 2018-06-03: 4 mg via INTRAVENOUS

## 2018-06-03 MED ORDER — LIDOCAINE 2% (20 MG/ML) 5 ML SYRINGE
INTRAMUSCULAR | Status: DC | PRN
  Administered 2018-06-03: 100 mg via INTRAVENOUS

## 2018-06-03 MED ORDER — LACTATED RINGERS IV SOLN
INTRAVENOUS | Status: DC | PRN
  Administered 2018-06-03: 10:00:00 via INTRAVENOUS

## 2018-06-03 MED ORDER — SUCCINYLCHOLINE CHLORIDE 200 MG/10ML IV SOSY
PREFILLED_SYRINGE | INTRAVENOUS | Status: AC
  Filled 2018-06-03: qty 10

## 2018-06-03 MED ORDER — EPHEDRINE SULFATE-NACL 50-0.9 MG/10ML-% IV SOSY
PREFILLED_SYRINGE | INTRAVENOUS | Status: DC | PRN
  Administered 2018-06-03: 10 mg via INTRAVENOUS

## 2018-06-03 MED ORDER — SUCCINYLCHOLINE CHLORIDE 200 MG/10ML IV SOSY
PREFILLED_SYRINGE | INTRAVENOUS | Status: DC | PRN
  Administered 2018-06-03: 80 mg via INTRAVENOUS

## 2018-06-03 MED ORDER — FENTANYL CITRATE (PF) 100 MCG/2ML IJ SOLN
INTRAMUSCULAR | Status: DC | PRN
  Administered 2018-06-03: 50 ug via INTRAVENOUS

## 2018-06-03 MED ORDER — BUPIVACAINE HCL 0.25 % IJ SOLN
INTRAMUSCULAR | Status: DC | PRN
  Administered 2018-06-03: 20 mL

## 2018-06-03 MED ORDER — PHENYLEPHRINE 40 MCG/ML (10ML) SYRINGE FOR IV PUSH (FOR BLOOD PRESSURE SUPPORT)
PREFILLED_SYRINGE | INTRAVENOUS | Status: AC
  Filled 2018-06-03: qty 10

## 2018-06-03 MED ORDER — DEXAMETHASONE SODIUM PHOSPHATE 10 MG/ML IJ SOLN
INTRAMUSCULAR | Status: AC
  Filled 2018-06-03: qty 1

## 2018-06-03 MED ORDER — DEXAMETHASONE SODIUM PHOSPHATE 10 MG/ML IJ SOLN
INTRAMUSCULAR | Status: DC | PRN
  Administered 2018-06-03: 4 mg via INTRAVENOUS

## 2018-06-03 MED ORDER — ROCURONIUM BROMIDE 100 MG/10ML IV SOLN
INTRAVENOUS | Status: AC
  Filled 2018-06-03: qty 1

## 2018-06-03 MED ORDER — CLOPIDOGREL BISULFATE 75 MG PO TABS
75.0000 mg | ORAL_TABLET | Freq: Every day | ORAL | Status: DC
  Administered 2018-06-03: 75 mg via ORAL
  Filled 2018-06-03: qty 1

## 2018-06-03 SURGICAL SUPPLY — 35 items
ADH SKN CLS APL DERMABOND .7 (GAUZE/BANDAGES/DRESSINGS) ×1
APPLIER CLIP ROT 10 11.4 M/L (STAPLE) ×2
APR CLP MED LRG 11.4X10 (STAPLE) ×1
BAG SPEC RTRVL LRG 6X4 10 (ENDOMECHANICALS) ×1
CABLE HIGH FREQUENCY MONO STRZ (ELECTRODE) ×2 IMPLANT
CHLORAPREP W/TINT 26ML (MISCELLANEOUS) ×2 IMPLANT
CLIP APPLIE ROT 10 11.4 M/L (STAPLE) IMPLANT
COVER WAND RF STERILE (DRAPES) ×1 IMPLANT
CUTTER FLEX LINEAR 45M (STAPLE) ×1 IMPLANT
DECANTER SPIKE VIAL GLASS SM (MISCELLANEOUS) ×2 IMPLANT
DERMABOND ADVANCED (GAUZE/BANDAGES/DRESSINGS) ×1
DERMABOND ADVANCED .7 DNX12 (GAUZE/BANDAGES/DRESSINGS) ×1 IMPLANT
ELECT REM PT RETURN 15FT ADLT (MISCELLANEOUS) ×2 IMPLANT
ENDOLOOP SUT PDS II  0 18 (SUTURE)
ENDOLOOP SUT PDS II 0 18 (SUTURE) IMPLANT
GLOVE BIO SURGEON STRL SZ7.5 (GLOVE) ×2 IMPLANT
GOWN STRL REUS W/TWL XL LVL3 (GOWN DISPOSABLE) ×4 IMPLANT
KIT BASIN OR (CUSTOM PROCEDURE TRAY) ×2 IMPLANT
POUCH SPECIMEN RETRIEVAL 10MM (ENDOMECHANICALS) ×2 IMPLANT
RELOAD 45 THICK GREEN (ENDOMECHANICALS) ×4 IMPLANT
RELOAD 45 VASCULAR/THIN (ENDOMECHANICALS) IMPLANT
RELOAD STAPLE 45 2.5 WHT GRN (ENDOMECHANICALS) IMPLANT
RELOAD STAPLE 45 3.5 BLU ETS (ENDOMECHANICALS) IMPLANT
RELOAD STAPLE 45 GRN THCK ETS (ENDOMECHANICALS) IMPLANT
RELOAD STAPLE TA45 3.5 REG BLU (ENDOMECHANICALS) ×2 IMPLANT
SCISSORS LAP 5X35 DISP (ENDOMECHANICALS) ×2 IMPLANT
SET IRRIG TUBING LAPAROSCOPIC (IRRIGATION / IRRIGATOR) ×2 IMPLANT
SET TUBE SMOKE EVAC HIGH FLOW (TUBING) ×2 IMPLANT
SHEARS HARMONIC ACE PLUS 36CM (ENDOMECHANICALS) ×2 IMPLANT
SUT MNCRL AB 4-0 PS2 18 (SUTURE) ×2 IMPLANT
TOWEL OR 17X26 10 PK STRL BLUE (TOWEL DISPOSABLE) ×2 IMPLANT
TRAY FOLEY MTR SLVR 16FR STAT (SET/KITS/TRAYS/PACK) ×1 IMPLANT
TRAY LAPAROSCOPIC (CUSTOM PROCEDURE TRAY) ×2 IMPLANT
TROCAR BLADELESS OPT 5 100 (ENDOMECHANICALS) ×2 IMPLANT
TROCAR XCEL BLUNT TIP 100MML (ENDOMECHANICALS) ×2 IMPLANT

## 2018-06-03 NOTE — Transfer of Care (Signed)
Immediate Anesthesia Transfer of Care Note  Patient: [name]  Procedure(s) Performed: APPENDECTOMY LAPAROSCOPIC (N/A Abdomen)  Patient Location: PACU  Anesthesia Type:General  Level of Consciousness: awake, alert , oriented and patient cooperative  Airway & Oxygen Therapy: Patient Spontanous Breathing and Patient connected to face mask oxygen  Post-op Assessment: Report given to RN, Post -op Vital signs reviewed and stable and Patient moving all extremities  Post vital signs: Reviewed and stable  Last Vitals:  Vitals Value Taken Time  BP    Temp    Pulse    Resp    SpO2      Last Pain:  Vitals:   06/03/18 0956  TempSrc: Oral  PainSc:       Patients Stated Pain Goal: 2 (05/31/18 1813)  Complications: No apparent anesthesia complications

## 2018-06-03 NOTE — Anesthesia Preprocedure Evaluation (Addendum)
Anesthesia Evaluation  Patient identified by MRN, date of birth, ID band Patient awake    Reviewed: Allergy & Precautions, NPO status , Patient's Chart, lab work & pertinent test results  Airway Mallampati: II  TM Distance: >3 FB Neck ROM: Full    Dental no notable dental hx. (+) Teeth Intact, Dental Advisory Given   Pulmonary sleep apnea , former smoker,    Pulmonary exam normal breath sounds clear to auscultation       Cardiovascular hypertension, Pt. on home beta blockers and Pt. on medications + CAD and + Past MI  Normal cardiovascular exam(-) dysrhythmias + Valvular Problems/Murmurs AS  Rhythm:Regular Rate:Normal  06/02/2018 Echo   1. The left ventricle has low normal systolic function, with an ejection fraction of 50-55% of 50-55%%. The cavity size was normal. There is mild concentric left ventricular hypertrophy. Left ventricular diastolic Doppler parameters are consistent with  pseudonormalization.  2. The right ventricle has normal systolic function. The cavity was normal.  3. The aortic valve is tricuspid Severe calcifcation of the aortic valve. moderate stenosis of the aortic valve Mean PG 26 mmHg, Vmax. 3.3 m.sec, AVA 1.3 cm2,  4. Mild mitral regurgitation.  5. Compared to previous study in 11/2016, aortic stenosis severity has increased from mild to moderate.   Neuro/Psych Anxiety Dementia CVA    GI/Hepatic   Endo/Other  diabetesDIET CONTROLLED  Renal/GU Renal InsufficiencyRenal disease     Musculoskeletal   Abdominal   Peds  Hematology HGB 11.2   Anesthesia Other Findings   Reproductive/Obstetrics                           Lab Results  Component Value Date   WBC 7.6 06/02/2018   HGB 11.2 (L) 06/02/2018   HCT 34.4 (L) 06/02/2018   MCV 96.6 06/02/2018   PLT 171 06/02/2018    Lab Results  Component Value Date   CREATININE 1.46 (H) 06/02/2018   BUN 22 06/02/2018   NA 137  06/02/2018   K 4.6 06/02/2018   CL 106 06/02/2018   CO2 26 06/02/2018    Anesthesia Physical Anesthesia Plan  ASA: III  Anesthesia Plan: General   Post-op Pain Management:    Induction: Cricoid pressure planned, Intravenous and Rapid sequence  PONV Risk Score and Plan: 3 and Treatment may vary due to age or medical condition, Ondansetron and Dexamethasone  Airway Management Planned: Oral ETT  Additional Equipment:   Intra-op Plan:   Post-operative Plan: Extubation in OR  Informed Consent:     Dental advisory given  Plan Discussed with: CRNA  Anesthesia Plan Comments:         Anesthesia Quick Evaluation

## 2018-06-03 NOTE — Progress Notes (Signed)
   Subjective/Chief Complaint: Complains of RLQ pain but improving   Objective: Vital signs in last 24 hours: Temp:  [97.6 F (36.4 C)-98.2 F (36.8 C)] 97.6 F (36.4 C) (02/23 0548) Pulse Rate:  [64-79] 79 (02/23 0548) Resp:  [16-18] 16 (02/23 0548) BP: (100-128)/(59-73) 128/73 (02/23 0548) SpO2:  [94 %-100 %] 94 % (02/23 0548) Last BM Date: 05/30/18  Intake/Output from previous day: 02/22 0701 - 02/23 0700 In: 2444.8 [P.O.:1200; I.V.:1044.8; IV Piggyback:200] Out: 625 [Urine:625] Intake/Output this shift: No intake/output data recorded.  General appearance: alert and cooperative Resp: clear to auscultation bilaterally Cardio: regular rate and rhythm GI: soft, focal tenderness RLQ  Lab Results:  Recent Labs    05/31/18 1817 06/02/18 0343  WBC 8.2 7.6  HGB 12.2* 11.2*  HCT 36.6* 34.4*  PLT 177 171   BMET Recent Labs    05/31/18 1817 06/02/18 0343  NA 137 137  K 4.1 4.6  CL 102 106  CO2 27 26  GLUCOSE 123* 98  BUN 34* 22  CREATININE 1.48* 1.46*  CALCIUM 9.2 8.6*   PT/INR No results for input(s): LABPROT, INR in the last 72 hours. ABG No results for input(s): PHART, HCO3 in the last 72 hours.  Invalid input(s): PCO2, PO2  Studies/Results: No results found.  Anti-infectives: Anti-infectives (From admission, onward)   Start     Dose/Rate Route Frequency Ordered Stop   06/01/18 2000  cefTRIAXone (ROCEPHIN) 2 g in sodium chloride 0.9 % 100 mL IVPB     2 g 200 mL/hr over 30 Minutes Intravenous Every 24 hours 05/31/18 2142     06/01/18 0600  metroNIDAZOLE (FLAGYL) IVPB 500 mg     500 mg 100 mL/hr over 60 Minutes Intravenous Every 8 hours 05/31/18 2142     05/31/18 2015  cefTRIAXone (ROCEPHIN) 2 g in sodium chloride 0.9 % 100 mL IVPB     2 g 200 mL/hr over 30 Minutes Intravenous  Once 05/31/18 2006 05/31/18 2204   05/31/18 2015  metroNIDAZOLE (FLAGYL) IVPB 500 mg     500 mg 100 mL/hr over 60 Minutes Intravenous  Once 05/31/18 2006 05/31/18 2204      Assessment/Plan: s/p * No surgery found * Cleared by Cardiology for lap appy  Risks and benefits of the surgery as well as some of the technical aspects discussed with patient and he understands and wishes to proceed  LOS: 3 days    [name] 06/03/2018

## 2018-06-03 NOTE — Anesthesia Procedure Notes (Signed)
Procedure Name: Intubation Date/Time: 06/03/2018 10:38 AM Performed by: Victoriano Lain, CRNA Pre-anesthesia Checklist: Patient identified, Emergency Drugs available, Suction available, Patient being monitored and Timeout performed Patient Re-evaluated:Patient Re-evaluated prior to induction Oxygen Delivery Method: Circle system utilized Preoxygenation: Pre-oxygenation with 100% oxygen Induction Type: IV induction, Rapid sequence and Cricoid Pressure applied Laryngoscope Size: Mac and 4 Grade View: Grade I Tube type: Oral Tube size: 7.5 mm Number of attempts: 1 Airway Equipment and Method: Stylet Placement Confirmation: ETT inserted through vocal cords under direct vision,  positive ETCO2 and breath sounds checked- equal and bilateral Secured at: 22 cm Tube secured with: Tape Dental Injury: Teeth and Oropharynx as per pre-operative assessment  Comments: RSI with cricoid pressure by Dr Valma Cava. Grade 1 view. ATOI. +ETCO2, BBS= ETT secured at 22 cm at the lip.

## 2018-06-03 NOTE — Op Note (Addendum)
06/03/2018  11:40 AM  PATIENT:  [name]  73 y.o. male  PRE-OPERATIVE DIAGNOSIS:  appendicitis  POST-OPERATIVE DIAGNOSIS:  appendicitis  PROCEDURE:  Procedure(s): APPENDECTOMY LAPAROSCOPIC (N/A)  SURGEON:  Surgeon(s) and Role:    * Griselda Miner, MD - Primary  PHYSICIAN ASSISTANT:   ASSISTANTS: none   ANESTHESIA:   local and general  EBL:  25 mL   BLOOD ADMINISTERED:none  DRAINS: none   LOCAL MEDICATIONS USED:  MARCAINE     SPECIMEN:  Source of Specimen:  appendix  DISPOSITION OF SPECIMEN:  PATHOLOGY  COUNTS:  YES  TOURNIQUET:  * No tourniquets in log *  DICTATION: .Dragon Dictation   After informed consent was obtained patient was brought to the operating room placed in the supine position on the operating room table. After adequate induction of general anesthesia the patient's abdomen was prepped with ChloraPrep, allowed to dry, and draped in usual sterile manner. The area below the umbilicus was infiltrated with quarter percent Marcaine. A small incision was made with a 15 blade knife. This incision was carried down through the subcutaneous tissue bluntly with a hemostat and [employer] retractors until the linea alba was identified. The linea alba was incised with a 15 blade knife. Each side was grasped Coker clamps and elevated anteriorly. The preperitoneal space was probed bluntly with a hemostat until the peritoneum was opened and access was gained to the abdominal cavity. A 0 Vicryl purse string stitch was placed in the fascia surrounding the opening. A Hassan cannula was placed through the opening and anchored in place with the previously placed Vicryl purse string stitch. The laparoscope was placed through the [facility] cannula. The abdomen was insufflated with carbon dioxide without difficulty. Next the suprapubic area was infiltrated with quarter percent Marcaine. A small incision was made with a 15 blade knife. A 5 mm port was placed bluntly through this incision  into the abdominal cavity. A site was then chosen in the left upper quadrant for placement of a 5 mm port. The area was infiltrated with quarter percent Marcaine. A small stab incision was made with a 15 blade knife. A 5 mm port was placed bluntly through this incision and the abdominal cavity under direct vision. The laparoscope was then moved to the suprapubic port. Using a Glassman grasper and harmonic scalpel the right lower quadrant was inspected. The appendix was readily identified. The appendix was elevated anteriorly and the mesoappendix was taken down sharply with the harmonic scalpel. Once the base of the appendix where it joined the cecum was identified and cleared of any tissue then a laparoscopic GIA blue load 6 row stapler was placed through the [facility] cannula. The stapler was placed across the base of the appendix clamped and fired thereby dividing the base of the appendix between staple lines. The staple line was also controlled with clips. A laparoscopic bag was then inserted through the [facility] cannula. The appendix was placed within the bag and the bag was sealed. The abdomen was then irrigated with copious amounts of saline until the effluent was clear. No other abnormalities were noted. The appendix and bag were removed with the [facility] cannula through the infraumbilical port without difficulty. The fascial defect was closed with the previously placed Vicryl pursestring stitch as well as with another interrupted 0 Vicryl figure-of-eight stitch. The rest of the ports were removed under direct vision and were found to be hemostatic. The gas was allowed to escape. The skin incisions were closed with interrupted  4-0 Monocryl subcuticular stitches. Dermabond dressings were applied. The patient tolerated the procedure well. At the end of the case all needle sponge and instrument counts were correct. The patient was then awakened and taken to recovery in stable condition.  PLAN OF CARE: Admit to  inpatient   PATIENT DISPOSITION:  PACU - hemodynamically stable.   Delay start of Pharmacological VTE agent (>24hrs) due to surgical blood loss or risk of bleeding: no

## 2018-06-03 NOTE — Progress Notes (Signed)
Does ask some questions on a repetitive basis, but then appears to understand. FOr all practical purposes he remains alert and oriented X 4

## 2018-06-04 ENCOUNTER — Encounter (HOSPITAL_COMMUNITY): Payer: Self-pay | Admitting: General Surgery

## 2018-06-04 LAB — GLUCOSE, CAPILLARY: Glucose-Capillary: 101 mg/dL — ABNORMAL HIGH (ref 70–99)

## 2018-06-04 MED ORDER — POLYETHYLENE GLYCOL 3350 17 G PO PACK: 17.0000 g | PACK | Freq: Every day | ORAL | 0 refills | Status: DC | PRN

## 2018-06-04 MED ORDER — ACETAMINOPHEN 325 MG PO TABS: 650.0000 mg | ORAL_TABLET | Freq: Four times a day (QID) | ORAL | 0 refills | Status: DC | PRN

## 2018-06-04 MED ORDER — OXYCODONE HCL 5 MG PO TABS: 5.0000 mg | ORAL_TABLET | Freq: Four times a day (QID) | ORAL | 0 refills | Status: DC | PRN

## 2018-06-04 MED ORDER — DOCUSATE SODIUM 100 MG PO CAPS: 100.0000 mg | ORAL_CAPSULE | Freq: Two times a day (BID) | ORAL | 2 refills | Status: DC | PRN

## 2018-06-04 NOTE — Anesthesia Postprocedure Evaluation (Signed)
Anesthesia Post Note  Patient: [name]  Procedure(s) Performed: APPENDECTOMY LAPAROSCOPIC (N/A Abdomen)     Patient location during evaluation: PACU Anesthesia Type: General Level of consciousness: awake and alert Pain management: pain level controlled Vital Signs Assessment: post-procedure vital signs reviewed and stable Respiratory status: spontaneous breathing, nonlabored ventilation, respiratory function stable and patient connected to nasal cannula oxygen Cardiovascular status: blood pressure returned to baseline and stable Postop Assessment: no apparent nausea or vomiting Anesthetic complications: no    Last Vitals:  Vitals:   06/04/18 0533 06/04/18 0939  BP: (!) 152/76 (!) 143/73  Pulse: 77 86  Resp: 20 18  Temp: 36.7 C   SpO2: 100%     Last Pain:  Vitals:   06/04/18 0851  TempSrc:   PainSc: 0-No pain                 Trevor Iha

## 2018-06-04 NOTE — Discharge Summary (Signed)
[facility] Discharge Summary   Patient ID: [name] MRN: [id number] DOB/AGE: 1946-01-16 73 y.o.  Admit date: 05/31/2018 Discharge date: 06/04/2018  Admitting Diagnosis: Acute appendicitis   Discharge Diagnosis Patient Active Problem List   Diagnosis Date Noted  . Acute appendicitis 05/31/2018  . Unilateral primary osteoarthritis, left knee 05/22/2018  . Unilateral primary osteoarthritis, right knee 05/22/2018  . Claudication in peripheral vascular disease (HCC) 04/30/2018  . IBS (irritable bowel syndrome) 11/12/2017  . Appendicitis, acute 11/08/2017  . Supplemental oxygen dependent 11/07/2017  . MCI (mild cognitive impairment) with memory loss 11/07/2017  . Sleep-related hypoxia 06/26/2017  . Chronic diastolic CHF (congestive heart failure) (HCC) 06/26/2017  . Stroke-like symptoms 03/30/2017  . CKD stage 3 due to type 2 diabetes mellitus (HCC)   . Benign essential HTN   . TIA (transient ischemic attack) 03/29/2017  . 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 History of completed stroke 10/24/2016  . Coronary artery disease involving coronary bypass graft with unspecified angina pectoris 05/21/2014  . Sleep related hypoventilation/hypoxemia in other disease 01/15/2014  . Primary snoring 12/09/2013  . Hypersomnia with sleep apnea 12/09/2013  . CAD (coronary artery disease) of artery bypass graft 12/09/2013    Consultants Cardiology (clearance)  Procedures Dr. Chevis Pretty (06/03/18) - Laparoscopic Appendectomy  Hospital Course:  [name] is a 73yo male who presented to [facility] with RLQ abdominal pain.  Workup showed findings consistent with acute appendicitis. Patient was admitted in 8/19 for a similar clinical scenario where he was diagnosed with acute appendicitis and was treated with IV abx given his significant cardiac history.   Patient was admitted and underwent procedure listed above. Prior to the procedure, cardiology  was consulted for clearance, which was granted. His Plavix was held prior to surgery and restarted on POD 0. The patient tolerated the procedure well and was transferred to the floor.  His diet was advanced. On POD 1, the patient was voiding well, tolerating diet, ambulating well (without assistance, in hallways), pain well controlled with oral medications, vital signs stable, incisions c/d/i and felt stable for discharge home.  Patient will follow up as below and knows to call with questions or concerns.    I have personally reviewed the patients medication history on the [place] controlled substance database.   Physical Exam: General:  Alert, NAD, pleasant, comfortable Heart: Regular Pulm: Lungs CTA b/l, effort normal Abd:  Soft, ND, appropriately tender, multiple lap incisions C/D/I, +BS   Allergies as of 06/04/2018      Reactions   Penicillins Other (See Comments)   DID THE REACTION INVOLVE: Swelling of the face/tongue/throat, SOB, or low BP? Unknown Sudden or severe rash/hives, skin peeling, or the inside of the mouth or nose? Unknown Did it require medical treatment? Unknown When did it last happen?70+ years ago If all above answers are "NO", may proceed with cephalosporin use.      Medication List    TAKE these medications   acetaminophen 325 MG tablet Commonly known as:  TYLENOL Take 2 tablets (650 mg total) by mouth every 6 (six) hours as needed for mild pain (or temp > 100).   ALPRAZolam 0.5 MG tablet Commonly known as:  XANAX Take 1 tablet (0.5 mg total) at bedtime as needed by mouth. What changed:    when to take this  additional instructions   aspirin EC 81 MG tablet Take 81 mg by mouth at bedtime.   atorvastatin 20 MG tablet Commonly  known as:  LIPITOR Take 20 mg by mouth at bedtime.   carvedilol 25 MG tablet Commonly known as:  COREG Take 25 mg by mouth 2 (two) times daily with a meal.   clopidogrel 75 MG tablet Commonly known as:  PLAVIX Take 75 mg by  mouth every morning.   docusate sodium 100 MG capsule Commonly known as:  COLACE Take 1 capsule (100 mg total) by mouth 2 (two) times daily as needed for mild constipation.   DULoxetine 60 MG capsule Commonly known as:  CYMBALTA Take 60 mg by mouth at bedtime.   fenofibrate 160 MG tablet Take 160 mg by mouth daily.   isosorbide mononitrate 30 MG 24 hr tablet Commonly known as:  IMDUR Take 60 mg by mouth 2 (two) times daily.   LINZESS 145 MCG Caps capsule Generic drug:  linaclotide Take 145 mcg by mouth daily as needed (constipation).   losartan 25 MG tablet Commonly known as:  COZAAR Take 0.5 tablets (12.5 mg total) by mouth daily. What changed:  how much to take   MULTIVITAMIN PO Take 1 tablet by mouth at bedtime.   nitroGLYCERIN 0.4 MG SL tablet Commonly known as:  NITROSTAT Place 1 tablet (0.4 mg total) under the tongue every 5 (five) minutes as needed for chest pain.   oxyCODONE 5 MG immediate release tablet Commonly known as:  Oxy IR/ROXICODONE Take 1 tablet (5 mg total) by mouth every 6 (six) hours as needed for moderate pain.   polyethylene glycol packet Commonly known as:  MIRALAX / GLYCOLAX Take 17 g by mouth daily as needed for mild constipation.   RANEXA 1000 MG SR tablet Generic drug:  ranolazine Take 1,000 mg by mouth 2 (two) times daily.        Follow-up Information    [facility] Follow up on 06/21/2018.   Specialty:  General Surgery Why:  Your appointment is for 3/12 at 1:30pm. Please arrive 30 minutes prior to your appointment for paperwork. Please bring a copy of your photo ID and insurance card.  Contact information: [address] [place] [address] [phone]           Signed: Jacinto Halim, [facility] 06/04/2018, 10:00 AM Pager: [phone]

## 2018-06-04 NOTE — Discharge Instructions (Signed)
[facility] ° °Please arrive at least 30 min before your appointment to complete your check in paperwork.  If you are unable to arrive 30 min prior to your appointment time we may have to cancel or reschedule you. °LAPAROSCOPIC SURGERY: POST OP INSTRUCTIONS °Always review your discharge instruction sheet given to you by the facility where your surgery was performed. °IF YOU HAVE DISABILITY OR FAMILY LEAVE FORMS, YOU MUST BRING THEM TO THE OFFICE FOR PROCESSING.   °DO NOT GIVE THEM TO YOUR DOCTOR. ° °PAIN CONTROL ° °1. First take acetaminophen (Tylenol) AND/or ibuprofen (Advil) to control your pain after surgery.  Follow directions on package.  Taking acetaminophen (Tylenol) and/or ibuprofen (Advil) regularly after surgery will help to control your pain and lower the amount of prescription pain medication you may need.  You should not take more than 4,000 mg (4 grams) of acetaminophen (Tylenol) in 24 hours.  You should not take ibuprofen (Advil), aleve, motrin, naprosyn or other NSAIDS if you have a history of stomach ulcers or chronic kidney disease.  °2. A prescription for pain medication may be given to you upon discharge.  Take your pain medication as prescribed, if you still have uncontrolled pain after taking acetaminophen (Tylenol) or ibuprofen (Advil). °3. Use ice packs to help control pain. °4. If you need a refill on your pain medication, please contact your pharmacy.  They will contact our office to request authorization. Prescriptions will not be filled after 5pm or on week-ends. ° °HOME MEDICATIONS °5. Take your usually prescribed medications unless otherwise directed. ° °DIET °6. You should follow a light diet the first few days after arrival home.  Be sure to include lots of fluids daily. Avoid fatty, fried foods.  ° °CONSTIPATION °7. It is common to experience some constipation after surgery and if you are taking pain medication.  Increasing fluid intake and taking a stool  softener (such as Colace) will usually help or prevent this problem from occurring.  A mild laxative (Milk of Magnesia or Miralax) should be taken according to package instructions if there are no bowel movements after 48 hours. ° °WOUND/INCISION CARE °8. Most patients will experience some swelling and bruising in the area of the incisions.  Ice packs will help.  Swelling and bruising can take several days to resolve.  °9. Unless discharge instructions indicate otherwise, follow guidelines below  °a. STERI-STRIPS - you may remove your outer bandages 48 hours after surgery, and you may shower at that time.  You have steri-strips (small skin tapes) in place directly over the incision.  These strips should be left on the skin for 7-10 days.   °b. DERMABOND/SKIN GLUE - you may shower in 24 hours.  The glue will flake off over the next 2-3 weeks. °10. Any sutures or staples will be removed at the office during your follow-up visit. ° °ACTIVITIES °11. You may resume regular (light) daily activities beginning the next day--such as daily self-care, walking, climbing stairs--gradually increasing activities as tolerated.  You may have sexual intercourse when it is comfortable.  Refrain from any heavy lifting or straining until approved by your doctor. °a. You may drive when you are no longer taking prescription pain medication, you can comfortably wear a seatbelt, and you can safely maneuver your car and apply brakes. ° °FOLLOW-UP °12. You should see your doctor in the office for a follow-up appointment approximately 2-3 weeks after your surgery.  You should have been given your post-op/follow-up appointment when   your surgery was scheduled.  If you did not receive a post-op/follow-up appointment, make sure that you call for this appointment within a day or two after you arrive home to insure a convenient appointment time.   WHEN TO CALL YOUR DOCTOR: 1. Fever over 101.0 2. Inability to urinate 3. Continued bleeding from  incision. 4. Increased pain, redness, or drainage from the incision. 5. Increasing abdominal pain  The clinic staff is available to answer your questions during regular business hours.  Please dont hesitate to call and ask to speak to one of the nurses for clinical concerns.  If you have a medical emergency, go to the nearest emergency room or call 911.  A surgeon from [facility] is always on call at the hospital. [address], [place], [place]  [address] ? [address] [address], [place], [place]   [address] [phone] ? [phone] ? FAX [phone]     Managing Your Pain After Surgery Without Opioids    Thank you for participating in our program to help patients manage their pain after surgery without opioids. This is part of our effort to provide you with the best care possible, without exposing you or your family to the risk that opioids pose.  What pain can I expect after surgery? You can expect to have some pain after surgery. This is normal. The pain is typically worse the day after surgery, and quickly begins to get better. Many studies have found that many patients are able to manage their pain after surgery with Over-the-Counter (OTC) medications such as Tylenol. If you have a condition that does not allow you to take Tylenol, notify your surgical team. You should avoid NSAIDs like Ibuprofen or Motrin as you are on Plavix.   How will I manage my pain? The best strategy for controlling your pain after surgery is around the clock pain control with Tylenol (acetaminophen). You can use heating pads or ice packs on your incisions to help reduce your pain.  How will I alternate your regular strength over-the-counter pain medication? You will take a dose of pain medication every three hours. ; Start by taking 650 mg of Tylenol (2 pills of 325 mg) ; 6 hours after take 650 mg of Tylenol   - 1 -   We recommend that you follow this schedule  around-the-clock for at least 3 days after surgery, or until you feel that it is no longer needed. Use the table on the last page of this handout to keep track of the medications you are taking. Important: Do not take more than 4000mg  of Tylenol in a 24-hour period. Do not take ibuprofen/Motrin as you are on plavix  What if I still have pain? If you have pain that is not controlled with the over-the-counter pain medications (Tylenol and Motrin or Advil) you might have what we call breakthrough pain. You will receive a prescription for a small amount of an opioid pain medication such as Oxycodone, Tramadol, or Tylenol with Codeine. Use these opioid pills in the first 24 hours after surgery if you have breakthrough pain. Do not take more than 1 pill every 4-6 hours.  If you still have uncontrolled pain after using all opioid pills, don't hesitate to call our staff using the number provided. We will help make sure you are managing your pain in the best way possible, and if necessary, we can provide a prescription for additional pain medication.   Day 1  Time  Name of Medication Number of pills taken  Amount of Acetaminophen  Pain Level   Comments  AM PM       AM PM       AM PM       AM PM       AM PM       AM PM       AM PM       AM PM       Total Daily amount of Acetaminophen Do not take more than  3,000 mg per day      Day 2    Time  Name of Medication Number of pills taken  Amount of Acetaminophen  Pain Level   Comments  AM PM       AM PM       AM PM       AM PM       AM PM       AM PM       AM PM       AM PM       Total Daily amount of Acetaminophen Do not take more than  3,000 mg per day      Day 3    Time  Name of Medication Number of pills taken  Amount of Acetaminophen  Pain Level   Comments  AM PM       AM PM       AM PM       AM PM          AM PM       AM PM       AM PM       AM PM       Total Daily amount of Acetaminophen Do not take  more than  3,000 mg per day      Day 4    Time  Name of Medication Number of pills taken  Amount of Acetaminophen  Pain Level   Comments  AM PM       AM PM       AM PM       AM PM       AM PM       AM PM       AM PM       AM PM       Total Daily amount of Acetaminophen Do not take more than  3,000 mg per day      Day 5    Time  Name of Medication Number of pills taken  Amount of Acetaminophen  Pain Level   Comments  AM PM       AM PM       AM PM       AM PM       AM PM       AM PM       AM PM       AM PM       Total Daily amount of Acetaminophen Do not take more than  3,000 mg per day       Day 6    Time  Name of Medication Number of pills taken  Amount of Acetaminophen  Pain Level  Comments  AM PM       AM PM       AM PM       AM PM  AM PM       AM PM       AM PM       AM PM       Total Daily amount of Acetaminophen Do not take more than  3,000 mg per day      Day 7    Time  Name of Medication Number of pills taken  Amount of Acetaminophen  Pain Level   Comments  AM PM       AM PM       AM PM       AM PM       AM PM       AM PM       AM PM       AM PM       Total Daily amount of Acetaminophen Do not take more than  3,000 mg per day        For additional information about how and where to safely dispose of unused opioid medications - [employer]  Disclaimer: This document contains information and/or instructional materials adapted from [facility] for the typical patient with your condition. It does not replace medical advice from your health care provider because your experience may differ from that of the typical patient. Talk to your health care provider if you have any questions about this document, your condition or your treatment plan. Adapted from [facility]

## 2018-06-04 NOTE — Progress Notes (Signed)
Writer went over discharge instructions to patient and family. No questions. NT will wheel patient to the front of the building

## 2018-06-05 ENCOUNTER — Ambulatory Visit: Payer: Medicare Other

## 2018-06-05 DIAGNOSIS — I739 Peripheral vascular disease, unspecified: Secondary | ICD-10-CM

## 2018-06-08 ENCOUNTER — Other Ambulatory Visit: Payer: Self-pay | Admitting: Cardiology

## 2018-06-08 DIAGNOSIS — I739 Peripheral vascular disease, unspecified: Secondary | ICD-10-CM

## 2018-06-10 ENCOUNTER — Telehealth: Payer: Self-pay | Admitting: Cardiology

## 2018-06-10 DIAGNOSIS — I739 Peripheral vascular disease, unspecified: Secondary | ICD-10-CM

## 2018-06-12 NOTE — Telephone Encounter (Signed)
Because the blockage can come back the highest rate in first 3 months

## 2018-06-13 NOTE — Telephone Encounter (Signed)
I spoke to her,he did have ABI after angioplasty and hence no need for repeat ABI. He does not want to repeat ABI. He will keep his usual appointment.

## 2018-07-09 ENCOUNTER — Ambulatory Visit: Payer: Medicare Other | Admitting: Neurology

## 2018-07-09 ENCOUNTER — Ambulatory Visit: Payer: Self-pay | Admitting: Neurology

## 2018-07-12 ENCOUNTER — Ambulatory Visit: Payer: Self-pay | Admitting: Neurology

## 2018-07-12 ENCOUNTER — Other Ambulatory Visit: Payer: Self-pay | Admitting: Cardiology

## 2018-07-12 DIAGNOSIS — I209 Angina pectoris, unspecified: Secondary | ICD-10-CM

## 2018-07-27 ENCOUNTER — Other Ambulatory Visit: Payer: Self-pay | Admitting: Cardiology

## 2018-07-27 DIAGNOSIS — Z9889 Other specified postprocedural states: Secondary | ICD-10-CM

## 2018-08-01 ENCOUNTER — Other Ambulatory Visit: Payer: Medicare Other

## 2018-10-22 ENCOUNTER — Other Ambulatory Visit: Payer: Self-pay | Admitting: Cardiology

## 2018-10-22 DIAGNOSIS — I209 Angina pectoris, unspecified: Secondary | ICD-10-CM

## 2018-10-29 ENCOUNTER — Other Ambulatory Visit: Payer: Medicare Other

## 2018-11-15 ENCOUNTER — Other Ambulatory Visit: Payer: Self-pay

## 2018-11-15 ENCOUNTER — Ambulatory Visit (INDEPENDENT_AMBULATORY_CARE_PROVIDER_SITE_OTHER): Payer: Medicare Other

## 2018-11-15 DIAGNOSIS — Z9889 Other specified postprocedural states: Secondary | ICD-10-CM

## 2018-11-15 DIAGNOSIS — I6522 Occlusion and stenosis of left carotid artery: Secondary | ICD-10-CM

## 2018-11-22 ENCOUNTER — Encounter: Payer: Self-pay | Admitting: Cardiology

## 2018-11-22 ENCOUNTER — Ambulatory Visit: Payer: Medicare Other | Admitting: Cardiology

## 2018-11-22 ENCOUNTER — Other Ambulatory Visit: Payer: Self-pay

## 2018-11-22 VITALS — BP 118/59 | HR 74 | Ht 69.0 in | Wt 209.1 lb

## 2018-11-22 DIAGNOSIS — I739 Peripheral vascular disease, unspecified: Secondary | ICD-10-CM | POA: Diagnosis not present

## 2018-11-22 DIAGNOSIS — I6521 Occlusion and stenosis of right carotid artery: Secondary | ICD-10-CM | POA: Diagnosis not present

## 2018-11-22 DIAGNOSIS — I25708 Atherosclerosis of coronary artery bypass graft(s), unspecified, with other forms of angina pectoris: Secondary | ICD-10-CM

## 2018-11-22 DIAGNOSIS — Z9889 Other specified postprocedural states: Secondary | ICD-10-CM

## 2018-11-22 NOTE — Progress Notes (Signed)
Primary Physician:  Adrian PrinceSouth, [name], MD   Patient ID: [name], male    DOB: 09/27/1945, 73 y.o.   MRN: [id number]  Subjective:    Chief Complaint  Patient presents with  . Coronary Artery Disease    6 month f/u    HPI: [name]  is a 73 y.o. male  with CAD S/P CABG in 2006, mild to moderate LV systolic dysfunction, chronic angina pectoris and left carotid endarterectomy, mild right ICA stenosis and history of possible TIA in August 2018, found to have moderate intracranial disease and PAD with moderate decrease in bilateral ABI, now on dual antiplatelet therapy. Past medical history significant for hypertension, had controlled diabetes, hyperlipidemia, chronic leg edema.  Patient underwent peripheral arteriogram with intervention to left SFA and also coronary angiogram revealing diffuse disease. He continue to notice improvement in his left leg heaviness, but still continues to have severe physical disability and marked weakness in his legs and arthritis in bilateral knee. He does have continued symptoms of chest pain, particularly at night that resolves with nitroglycerin; however, has decreased in frequency over the last 6 months.   Past Medical History:  Diagnosis Date  . Anxiety   . CAD (coronary artery disease) of artery bypass graft 12/09/2013  . Chronic lower back pain   . Coronary artery disease   . Dementia (HCC)    "step dementia; from mini strokes in 2018" (11/08/2017)  . Depression   . Diabetes mellitus    "totally diet controlled" (11/08/2017)  . Hearing loss    wears bilateral hearing aids  . High cholesterol   . History of kidney stones   . Hypertension   . MI (myocardial infarction) (HCC) [phone]   (several)  . On home oxygen therapy    "2L when he sleeps at night" (11/08/2017)  . Osteoarthritis of both knees    "both knees need replaced" (11/08/2017)  . Sleep apnea    "mild; uses oxygen" (11/08/2017)  . Stroke [facility](HCC) 2018   "several silent ones; a  little memory loss; step dementia" (11/08/2017)    Past Surgical History:  Procedure Laterality Date  . 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 CARDIAC CATHETERIZATION     "many"  . CAROTID ENDARTERECTOMY Left   . CORONARY ARTERY BYPASS GRAFT  1996   5 vessels  . CORONARY/GRAFT ANGIOGRAPHY N/A 05/01/2018   Procedure: CORONARY/GRAFT ANGIOGRAPHY;  Surgeon: Yates DecampGanji, Jay, MD;  [facility];  Service: Cardiovascular;  Laterality: N/A;  . FRACTURE SURGERY    . KIDNEY STONE SURGERY  ~ 2014   "laparoscopic, robotic OR"  . LAPAROSCOPIC APPENDECTOMY N/A 06/03/2018   Procedure: APPENDECTOMY LAPAROSCOPIC;  Surgeon: Griselda Mineroth, Paul III, MD;  Location: [facility] ORS;  Service: General;  Laterality: N/A;  . LOWER EXTREMITY ANGIOGRAPHY Bilateral 05/01/2018   Procedure: LOWER EXTREMITY ANGIOGRAPHY;  Surgeon: Yates DecampGanji, Jay, MD;  Location: [facility] INVASIVE CV LAB;  Service: Cardiovascular;  Laterality: Bilateral;  . PERIPHERAL VASCULAR ATHERECTOMY Left 05/01/2018   Procedure: PERIPHERAL VASCULAR ATHERECTOMY;  Surgeon: Yates DecampGanji, Jay, MD;  [facility];  Service: Cardiovascular;  Laterality: Left;  SFA  . PERIPHERAL VASCULAR BALLOON ANGIOPLASTY  05/01/2018   Procedure: PERIPHERAL VASCULAR BALLOON ANGIOPLASTY;  Surgeon: Yates DecampGanji, Jay, MD;  Location: [facility]ASIVE CV LAB;  Service: Cardiovascular;;  . WRIST FRACTURE SURGERY Left 2001  . WRIST SURGERY      Social History  Socioeconomic History  . Marital status: Married    Spouse name: [name]  . Number of children: 0  . Years of education: Grad. scho  . Highest education level: Not on file  Occupational History  . Not on file  Social Needs  . Financial resource strain: Not on file  . Food insecurity    Worry: Not on file    Inability: Not on file  . Transportation needs    Medical: Not on file    Non-medical: Not on file  Tobacco Use  . Smoking status: Former Smoker    Packs/day: 2.00     Years: 20.00    Pack years: 40.00    Types: Cigarettes    Quit date: 10/20/1983    Years since quitting: 35.1  . Smokeless tobacco: Never Used  Substance and Sexual Activity  . Alcohol use: Not Currently  . Drug use: Yes    Types: Marijuana    Comment: 11/08/2017 "couple times/month"  . Sexual activity: Not Currently  Lifestyle  . Physical activity    Days per week: Not on file    Minutes per session: Not on file  . Stress: Not on file  Relationships  . Social Musicianconnections    Talks on phone: Not on file    Gets together: Not on file    Attends religious service: Not on file    Active member of club or organization: Not on file    Attends meetings of clubs or organizations: Not on file    Relationship status: Not on file  . Intimate partner violence    Fear of current or ex partner: Not on file    Emotionally abused: Not on file    Physically abused: Not on file    Forced sexual activity: Not on file  Other Topics Concern  . Not on file  Social History Narrative   Patient is married Medical illustrator([name]) and lives at home with his wife.   Patient is retired.   Patient has a Naval architectGraduate school education.   Patient is right-handed.   Patient drinks 3-4 cups of coffee and three cups of soda/tea per day.    Review of Systems  Constitution: Negative for decreased appetite, malaise/fatigue, weight gain and weight loss.  Eyes: Negative for visual disturbance.  Cardiovascular: Positive for chest pain. Negative for claudication, dyspnea on exertion, leg swelling, orthopnea, palpitations and syncope.  Respiratory: Negative for hemoptysis and wheezing.   Endocrine: Negative for cold intolerance and heat intolerance.  Hematologic/Lymphatic: Does not bruise/bleed easily.  Skin: Negative for nail changes.  Musculoskeletal: Positive for joint pain (bilateral knee). Negative for muscle weakness and myalgias.  Gastrointestinal: Negative for abdominal pain, change in bowel habit, nausea and vomiting.   Neurological: Negative for difficulty with concentration, dizziness, focal weakness and headaches.  Psychiatric/Behavioral: Negative for altered mental status and suicidal ideas.  All other systems reviewed and are negative.     Objective:  Blood pressure (!) 118/59, pulse 74, height 5\' 9"  (1.753 m), weight 209 lb 1.6 oz (94.8 kg), SpO2 93 %. Body mass index is 30.88 kg/m.    Physical Exam  Constitutional: He is oriented to person, place, and time. Vital signs are normal. He appears well-developed and well-nourished.  Mildly obese  HENT:  Head: Normocephalic and atraumatic.  Neck: Normal range of motion.  Cardiovascular: Normal rate, regular rhythm and intact distal pulses.  Murmur heard.  Early systolic murmur is present with a grade of 2/6 at the upper right sternal border  radiating to the neck. Pulses:      Carotid pulses are on the right side with bruit.      Femoral pulses are 1+ on the right side with bruit and 1+ on the left side with bruit.      Popliteal pulses are 0 on the right side and 0 on the left side.       Dorsalis pedis pulses are 1+ on the right side and 1+ on the left side.       Posterior tibial pulses are 0 on the right side and 0 on the left side.  1+ pitting edema  Pulmonary/Chest: Effort normal and breath sounds normal. No accessory muscle usage. No respiratory distress.  Abdominal: Soft. Bowel sounds are normal. A hernia is present. Hernia confirmed positive in the ventral area.  Musculoskeletal: Normal range of motion.  Neurological: He is alert and oriented to person, place, and time.  Skin: Skin is warm and dry.  Vitals reviewed.  Radiology: No results found.  Laboratory examination:    CMP Latest Ref Rng & Units 06/02/2018 05/31/2018 11/11/2017  Glucose 70 - 99 mg/dL 98 123(H) 95  BUN 8 - 23 mg/dL 22 34(H) 13  Creatinine 0.61 - 1.24 mg/dL 1.46(H) 1.48(H) 1.38(H)  Sodium 135 - 145 mmol/L 137 137 139  Potassium 3.5 - 5.1 mmol/L 4.6 4.1 4.1   Chloride 98 - 111 mmol/L 106 102 104  CO2 22 - 32 mmol/L 26 27 25   Calcium 8.9 - 10.3 mg/dL 8.6(L) 9.2 9.2  Total Protein 6.5 - 8.1 g/dL - 7.0 -  Total Bilirubin 0.3 - 1.2 mg/dL - 0.7 -  Alkaline Phos 38 - 126 U/L - 31(L) -  AST 15 - 41 U/L - 26 -  ALT 0 - 44 U/L - 21 -   CBC Latest Ref Rng & Units 06/02/2018 05/31/2018 11/09/2017  WBC 4.0 - 10.5 K/uL 7.6 8.2 9.1  Hemoglobin 13.0 - 17.0 g/dL 11.2(L) 12.2(L) 11.3(L)  Hematocrit 39.0 - 52.0 % 34.4(L) 36.6(L) 34.1(L)  Platelets 150 - 400 K/uL 171 177 215   Lipid Panel     Component Value Date/Time   CHOL 150 03/30/2017 0414   TRIG 208 (H) 03/30/2017 0414   HDL 34 (L) 03/30/2017 0414   CHOLHDL 4.4 03/30/2017 0414   VLDL 42 (H) 03/30/2017 0414   LDLCALC 74 03/30/2017 0414   HEMOGLOBIN A1C Lab Results  Component Value Date   HGBA1C 5.8 (H) 03/30/2017   MPG 119.76 03/30/2017   TSH No results for input(s): TSH in the last 8760 hours.  PRN Meds:. Medications Discontinued During This Encounter  Medication Reason  . docusate sodium (COLACE) 100 MG capsule Error  . losartan (COZAAR) 25 MG tablet Error  . polyethylene glycol (MIRALAX / GLYCOLAX) packet Error   Current Meds  Medication Sig  . acetaminophen (TYLENOL) 325 MG tablet Take 2 tablets (650 mg total) by mouth every 6 (six) hours as needed for mild pain (or temp > 100).  . ALPRAZolam (XANAX) 0.5 MG tablet Take 1 tablet (0.5 mg total) at bedtime as needed by mouth. (Patient taking differently: Take 0.5 mg by mouth See admin instructions. Take 0.5 mg at bedtime, may take an additional 0.5 mg dose as needed for anxiety)  . aspirin EC 81 MG tablet Take 81 mg by mouth at bedtime.  Marland Kitchen atorvastatin (LIPITOR) 20 MG tablet Take 20 mg by mouth at bedtime.  . carvedilol (COREG) 25 MG tablet Take 25 mg by mouth 2 (two)  times daily with a meal.  . clopidogrel (PLAVIX) 75 MG tablet Take 75 mg by mouth every morning.   . DULoxetine (CYMBALTA) 60 MG capsule Take 60 mg by mouth at bedtime.  .  fenofibrate 160 MG tablet Take 160 mg by mouth daily.  . isosorbide mononitrate (IMDUR) 30 MG 24 hr tablet Take 60 mg by mouth 2 (two) times daily.  Marland Kitchen. LINZESS 145 MCG CAPS capsule Take 145 mcg by mouth daily as needed (constipation).   Marland Kitchen. losartan (COZAAR) 25 MG tablet Take 25 mg by mouth daily.  . Multiple Vitamins-Minerals (MULTIVITAMIN PO) Take 1 tablet by mouth at bedtime.   . nitroGLYCERIN (NITROSTAT) 0.4 MG SL tablet 1 TAB SUBLINGUA TAB SUBLINGUA TAB SUBLINGUAL EVERY 5 MINUTES AS NEEDED FOR CHEST PAIN.  Marland Kitchen. oxyCODONE (OXY IR/ROXICODONE) 5 MG immediate release tablet Take 1 tablet (5 mg total) by mouth every 6 (six) hours as needed for moderate pain.  . ranolazine (RANEXA) 1000 MG SR tablet Take 1,000 mg by mouth 2 (two) times daily.     Cardiac Studies:  Coronary angiogram 05/01/2018: Left main patent, diffuse disease, native RCA, LAD circumflex occluded. LIMA to LAD patent, SVG to M2 patent with insertion site mismatch. Known occluded SVG to RCA and SVG to D1.  Peripheral angiogram 05/01/2018: Left SFA tandem 95% stenosis S/P directional atherectomy with ith HawkOne LS and InPact Admiral DCB 5.0 X 250 mm balloon. Left PT occluded. Right ostial SFA 70%, right mid to distal SFA and mid popliteal 95% stenosis, right PT occluded.  ABI 06/04/2018 : This exam reveals moderately decreased perfusion of the right lower extremity, noted at the post tibial artery level (ABI 0.68). This exam reveals mildly decreased perfusion of the left lower extremity, noted at the dorsalis pedis artery level (ABI 0.83).  Compared to 02/14/17, Left ABI has improved from 0.50 and suggests successful revascularization.  Carotid artery duplex  11/15/2018: Stenosis in the right internal carotid artery (50-69%). stenosis in the right external carotid artery (<50%). No evidence of significant stenosis in the left carotid vessels. Antegrade right vertebral artery flow. Antegrade left vertebral artery flow. Compared to the  the study done on 05/08/2018, no significant change. Follow up in six months is appropriate if clinically indicated.  Assessment:     ICD-10-CM   1. Coronary artery disease of bypass graft of native heart with stable angina pectoris (HCC)  I25.708 EKG 12-Lead  2. Claudication in peripheral vascular disease (HCC)  I73.9   3. Stenosis of right carotid artery  I65.21 PCV CAROTID DUPLEX (BILATERAL)  4. History of left-sided carotid endarterectomy  Z98.890     EKG 11/22/2018: Normal sinus rhythm at the rate of 70 bpm, IVCD, borderline criteria for LVH. Inferior infarct old, posterior infarct old. Normal QT interval. No evidence of ischemia. No significant change from EKG 04/05/2018.   Recommendations:   Patient is presently doing well, has stable angina that is easily resolved with nitroglycerin.  Compared to 6 months ago, frequency of these episodes have actually improved.  He is on appropriate medical therapy.  Reports last with PCP have been stable, we'll request for records.  In regard to PAD, he had improvement in ABI in February 2020.  Vascular exam also seems to have improved.  Except for bilateral knee pain that is limiting his activity, he denies any symptoms of claudication.  We'll continue to monitor.  Has stable leg edema bilaterally. Carotid duplex shows stable right carotid stenosis is stable, will repeat in 6 months. We  will see him back in 6 months or sooner if needed.  Toniann Fail, MSN, APRN, FNP-C [facility] Office: [phone] Fax: [phone]

## 2018-11-25 ENCOUNTER — Encounter: Payer: Self-pay | Admitting: Cardiology

## 2019-01-09 ENCOUNTER — Other Ambulatory Visit: Payer: Self-pay | Admitting: Cardiology

## 2019-01-09 DIAGNOSIS — I209 Angina pectoris, unspecified: Secondary | ICD-10-CM

## 2019-02-25 ENCOUNTER — Other Ambulatory Visit: Payer: Self-pay | Admitting: Cardiology

## 2019-02-25 DIAGNOSIS — I209 Angina pectoris, unspecified: Secondary | ICD-10-CM

## 2019-02-25 MED ORDER — NITROGLYCERIN 0.4 MG SL SUBL: 0.4000 mg | SUBLINGUAL_TABLET | SUBLINGUAL | 2 refills | Status: DC | PRN

## 2019-04-15 ENCOUNTER — Other Ambulatory Visit: Payer: Self-pay | Admitting: Cardiology

## 2019-04-19 ENCOUNTER — Other Ambulatory Visit: Payer: Self-pay

## 2019-04-19 ENCOUNTER — Ambulatory Visit (INDEPENDENT_AMBULATORY_CARE_PROVIDER_SITE_OTHER): Payer: Medicare PPO

## 2019-04-19 DIAGNOSIS — I6521 Occlusion and stenosis of right carotid artery: Secondary | ICD-10-CM

## 2019-04-21 ENCOUNTER — Other Ambulatory Visit: Payer: Self-pay | Admitting: Cardiology

## 2019-04-21 DIAGNOSIS — I6521 Occlusion and stenosis of right carotid artery: Secondary | ICD-10-CM

## 2019-04-21 DIAGNOSIS — Z9889 Other specified postprocedural states: Secondary | ICD-10-CM

## 2019-04-21 NOTE — Progress Notes (Signed)
No change in the right carotid stenoiss.   Carotid stenosis: Blockage in the carotid artery. Most blockages are  mostly kept under surveillance. Blockages <80% are generally treaded medically and no surgery is needed. If it becomes severe, MD will discuss further options. BP, cholesterol, DM need to be controlled good to reduce risk of progression.

## 2019-04-24 ENCOUNTER — Other Ambulatory Visit: Payer: Self-pay

## 2019-04-24 MED ORDER — ATORVASTATIN CALCIUM 20 MG PO TABS: 20.0000 mg | ORAL_TABLET | Freq: Every day | ORAL | 3 refills | Status: DC

## 2019-05-22 ENCOUNTER — Other Ambulatory Visit: Payer: Self-pay | Admitting: Cardiology

## 2019-05-22 DIAGNOSIS — I209 Angina pectoris, unspecified: Secondary | ICD-10-CM

## 2019-05-22 NOTE — Telephone Encounter (Signed)
refill 

## 2019-05-29 ENCOUNTER — Encounter: Payer: Self-pay | Admitting: Cardiology

## 2019-05-29 ENCOUNTER — Ambulatory Visit: Payer: Medicare PPO | Admitting: Cardiology

## 2019-05-29 ENCOUNTER — Other Ambulatory Visit: Payer: Self-pay

## 2019-05-29 VITALS — BP 149/68 | HR 66 | Temp 97.6°F | Ht 70.0 in | Wt 209.5 lb

## 2019-05-29 DIAGNOSIS — I739 Peripheral vascular disease, unspecified: Secondary | ICD-10-CM

## 2019-05-29 DIAGNOSIS — I6521 Occlusion and stenosis of right carotid artery: Secondary | ICD-10-CM

## 2019-05-29 DIAGNOSIS — E782 Mixed hyperlipidemia: Secondary | ICD-10-CM

## 2019-05-29 DIAGNOSIS — I10 Essential (primary) hypertension: Secondary | ICD-10-CM

## 2019-05-29 DIAGNOSIS — I25708 Atherosclerosis of coronary artery bypass graft(s), unspecified, with other forms of angina pectoris: Secondary | ICD-10-CM

## 2019-05-29 MED ORDER — EZETIMIBE 10 MG PO TABS: 10.0000 mg | ORAL_TABLET | Freq: Every day | ORAL | 3 refills | Status: DC

## 2019-05-29 NOTE — Progress Notes (Signed)
Primary Physician/Referring:  Reynold Bowen, MD  Patient ID: [name], male    DOB: 1945-06-22, 74 y.o.   MRN: [id number]  Chief Complaint  Patient presents with  . Coronary Artery Disease  . PAD  . cartoid stenosis  . Chest Pain  . Follow-up   HPI:    [name]  is a 74 y.o. Caucasian male  with hypertension, mixed hyperlipidemia, CAD S/P CABG in 2006, mild to moderate LV systolic dysfunction, chronic angina pectoris and left carotid endarterectomy, mild right ICA stenosis and history of possible TIA in August 2018, found to have moderate intracranial disease and PAD with moderate decrease in bilateral ABI and diffuse PAD by angiogram, now on dual antiplatelet therapy.   He is presently doing well and states that anginal symptoms are remained stable. He does use nitroglycerin with relief of chest pain.  Symptoms of claudication are also stable, he is walking within the house with the help of a walker.  He has not noticed any ulceration.  States that his blood pressure has been well controlled.    Past Medical History:  Diagnosis Date  . Anxiety   . CAD (coronary artery disease) of artery bypass graft 12/09/2013  . Chronic lower back pain   . Coronary artery disease   . Dementia ([place])    "step dementia; from mini strokes in 2018" (11/08/2017)  . Depression   . Diabetes mellitus    "totally diet controlled" (11/08/2017)  . Hearing loss    wears bilateral hearing aids  . High cholesterol   . History of kidney stones   . Hypertension   . MI (myocardial infarction) ([place]) [phone]   (several)  . On home oxygen therapy    "2L when he sleeps at night" (11/08/2017)  . Osteoarthritis of both knees    "both knees need replaced" (11/08/2017)  . Sleep apnea    "mild; uses oxygen" (11/08/2017)  . Stroke [facility]) 2018   "several silent ones; a little memory loss; step dementia" (11/08/2017)   Past Surgical History:  Procedure Laterality Date  . AV FISTULA REPAIR  2005   "developed  av fistula after cath; had to have it repaired" (11/08/2017)  . CARDIAC CATHETERIZATION     "many"  . CAROTID ENDARTERECTOMY Left   . CORONARY ARTERY BYPASS GRAFT  1996   5 vessels  . CORONARY/GRAFT ANGIOGRAPHY N/A 05/01/2018   Procedure: CORONARY/GRAFT ANGIOGRAPHY;  Surgeon: Adrian Prows, MD;  Location: [place] [facility];  Service: Cardiovascular;  Laterality: N/A;  . FRACTURE SURGERY    . KIDNEY STONE SURGERY  ~ 2014   "laparoscopic, robotic OR"  . LAPAROSCOPIC APPENDECTOMY N/A 06/03/2018   Procedure: APPENDECTOMY LAPAROSCOPIC;  Surgeon: Jovita Kussmaul, MD;  Location: [facility];  Service: General;  Laterality: N/A;  . LOWER EXTREMITY ANGIOGRAPHY Bilateral 05/01/2018   Procedure: LOWER EXTREMITY ANGIOGRAPHY;  Surgeon: Adrian Prows, MD;  Location: [place] [facility];  Service: Cardiovascular;  Laterality: Bilateral;  . PERIPHERAL VASCULAR ATHERECTOMY Left 05/01/2018   Procedure: PERIPHERAL VASCULAR ATHERECTOMY;  Surgeon: Adrian Prows, MD;  Location: [place] [facility];  Service: Cardiovascular;  Laterality: Left;  SFA  . PERIPHERAL VASCULAR BALLOON ANGIOPLASTY  05/01/2018   Procedure: PERIPHERAL VASCULAR BALLOON ANGIOPLASTY;  Surgeon: Adrian Prows, MD;  Location: [place] [facility];  Service: Cardiovascular;;  . WRIST FRACTURE SURGERY Left 2001  . WRIST SURGERY     Family History  Problem Relation Age of Onset  . Heart disease Mother   .  Heart disease Father   . Liver cancer Other   . Parkinson's disease Other   . Heart disease Brother   . Parkinsonism Sister   . Cancer Sister     Social History   Tobacco Use  . Smoking status: Former Smoker    Packs/day: 2.00    Years: 20.00    Pack years: 40.00    Types: Cigarettes    Quit date: 10/20/1983    Years since quitting: 35.6  . Smokeless tobacco: Never Used  Substance Use Topics  . Alcohol use: Not Currently   ROS  Review of Systems  Cardiovascular: Positive for chest pain and leg swelling (stable). Negative for dyspnea on exertion.    Musculoskeletal: Positive for arthritis and back pain.  Gastrointestinal: Negative for melena.   Objective  Blood pressure (!) 149/68, pulse 66, temperature 97.6 F (36.4 C), height '5\' 10"'$  (1.778 m), weight 209 lb 8 oz (95 kg), SpO2 97 %.  Vitals with BMI 05/29/2019 11/22/2018 06/04/2018  Height '5\' 10"'$  '5\' 9"'$  -  Weight 209 lbs 8 oz 209 lbs 2 oz -  BMI 22.63 33.54 -  Systolic 562 563 893  Diastolic 68 59 73  Pulse 66 74 86     Physical Exam  Constitutional: Vital signs are normal. He appears well-nourished.  Mildly obese  HENT:  Head: Atraumatic.  Cardiovascular: Normal rate, regular rhythm and intact distal pulses.  Murmur heard.  Early systolic murmur is present with a grade of 2/6 at the upper right sternal border radiating to the neck. Pulses:      Carotid pulses are on the right side with bruit.      Femoral pulses are 1+ on the right side with bruit and 1+ on the left side with bruit.      Popliteal pulses are 0 on the right side and 0 on the left side.       Dorsalis pedis pulses are 1+ on the right side and 1+ on the left side.       Posterior tibial pulses are 0 on the right side and 0 on the left side.  1+ pitting edema  Pulmonary/Chest: Effort normal and breath sounds normal. No accessory muscle usage. No respiratory distress.  Abdominal: Soft. Bowel sounds are normal. A hernia is present. Hernia confirmed positive in the ventral area.  Musculoskeletal:        General: Normal range of motion.  Vitals reviewed.  Laboratory examination:   Recent Labs    05/31/18 1817 06/02/18 0343  NA 137 137  K 4.1 4.6  CL 102 106  CO2 27 26  GLUCOSE 123* 98  BUN 34* 22  CREATININE 1.48* 1.46*  CALCIUM 9.2 8.6*  GFRNONAA 46* 47*  GFRAA 53* 54*   CrCl cannot be calculated (Patient's most recent lab result is older than the maximum 21 days allowed.).  CMP Latest Ref Rng & Units 06/02/2018 05/31/2018 11/11/2017  Glucose 70 - 99 mg/dL 98 123(H) 95  BUN 8 - 23 mg/dL 22 34(H) 13   Creatinine 0.61 - 1.24 mg/dL 1.46(H) 1.48(H) 1.38(H)  Sodium 135 - 145 mmol/L 137 137 139  Potassium 3.5 - 5.1 mmol/L 4.6 4.1 4.1  Chloride 98 - 111 mmol/L 106 102 104  CO2 22 - 32 mmol/L '26 27 25  '$ Calcium 8.9 - 10.3 mg/dL 8.6(L) 9.2 9.2  Total Protein 6.5 - 8.1 g/dL - 7.0 -  Total Bilirubin 0.3 - 1.2 mg/dL - 0.7 -  Alkaline Phos 38 -  126 U/L - 31(L) -  AST 15 - 41 U/L - 26 -  ALT 0 - 44 U/L - 21 -   CBC Latest Ref Rng & Units 06/02/2018 05/31/2018 11/09/2017  WBC 4.0 - 10.5 K/uL 7.6 8.2 9.1  Hemoglobin 13.0 - 17.0 g/dL 11.2(L) 12.2(L) 11.3(L)  Hematocrit 39.0 - 52.0 % 34.4(L) 36.6(L) 34.1(L)  Platelets 150 - 400 K/uL 171 177 215   External labs 09/10/2018:   Cholesterol, total 167.000 m 09/10/2018 HDL 40 MG/DL 09/10/2018 LDL 107.000 m 09/10/2018 Triglycerides 98.000 09/10/2018  A1C 5.200 % 09/10/2018 Hemoglobin 12.900 g/ 09/10/2018 Creatinine, Serum 1.400 mg/ 09/10/2018  TSH 1.730 03/23/2017  Medications and allergies   Allergies  Allergen Reactions  . Penicillins Other (See Comments)    DID THE REACTION INVOLVE: Swelling of the face/tongue/throat, SOB, or low BP? Unknown Sudden or severe rash/hives, skin peeling, or the inside of the mouth or nose? Unknown Did it require medical treatment? Unknown When did it last happen?70+ years ago If all above answers are "NO", may proceed with cephalosporin use.     Current Outpatient Medications  Medication Instructions  . acetaminophen (TYLENOL) 650 mg, Oral, Every 6 hours PRN  . ALPRAZolam (XANAX) 0.5 mg, Oral, At bedtime PRN  . aspirin EC 81 mg, Oral, Daily at bedtime  . atorvastatin (LIPITOR) 20 mg, Oral, Daily at bedtime  . carvedilol (COREG) 25 mg, Oral, 2 times daily with meals  . clopidogrel (PLAVIX) 75 mg, Oral,  Every morning - 10a  . DULoxetine (CYMBALTA) 60 mg, Oral, Daily at bedtime  . ezetimibe (ZETIA) 10 mg, Oral, Daily  . fenofibrate 160 mg, Oral, Daily  . isosorbide mononitrate (IMDUR) 60 mg, Oral, 2 times daily   . Linzess 145 mcg, Oral, Daily PRN  . losartan (COZAAR) 25 mg, Oral, Daily  . Multiple Vitamins-Minerals (MULTIVITAMIN PO) 1 tablet, Oral, Daily at bedtime  . nitroGLYCERIN (NITROSTAT) 0.4 mg, Sublingual, Every 5 min PRN  . ranolazine (RANEXA) 1000 MG SR tablet TAKE 1 TABLET BY MOUTH TWICE A DAY    Radiology:   No results found.  Cardiac Studies:   Coronary angiogram 05/01/2018: Left main patent, diffuse disease of native RCA, LAD circumflex occluded. LIMA to LAD patent, SVG to M2 patent with insertion site mismatch. Known occluded SVG to RCA and SVG to D1.  Peripheral angiogram 05/01/2018: Left SFA tandem 95% stenosis S/P directional atherectomy with ith HawkOne LS and InPact Admiral DCB 5.0 X 250 mm balloon. Left PT occluded. Right ostial SFA 70%, right mid to distal SFA and mid popliteal 95% stenosis, right PT occluded.  ABI 06/04/2018 : This exam reveals moderately decreased perfusion of the right lower extremity, noted at the post tibial artery level (ABI 0.68). This exam reveals mildly decreased perfusion of the left lower extremity, noted at the dorsalis pedis artery level (ABI 0.83).  Compared to 02/14/17, Left ABI has improved from 0.50 and suggests successful revascularization.  Carotid artery duplex  11/15/2018: Stenosis in the right internal carotid artery (50-69%). stenosis in the right external carotid artery (<50%). No evidence of significant stenosis in the left carotid vessels. Antegrade right vertebral artery flow. Antegrade left vertebral artery flow. Compared to the the study done on 05/08/2018, no significant change. Follow up in six months is appropriate if clinically indicated.  Assessment     ICD-10-CM   1. Coronary artery disease of bypass graft of native heart with stable angina pectoris (HCC)  I25.708 EKG 12-Lead    CBC  2. Claudication in  peripheral vascular disease (HCC)  I73.9   3. Stenosis of right carotid artery  I65.21   4. Mixed hyperlipidemia   E78.2 ezetimibe (ZETIA) 10 MG tablet    Lipid Panel With LDL/HDL Ratio  5. Primary hypertension  I10 CMP14+EGFR    TSH    EKG 05/29/2019: Normal sinus rhythm at rate of 61 bpm, normal axis, inferior infarct old.  Cannot exclude posterior infarct old.  Frequent PVCs in bigeminal pattern.  IVCD, borderline corrected for LVH.  ST-T abnormality, consider lateral ischemia.  Baseline artifact.  Compared to prior EKG 11/22/2018, frequent PVCs new.    Meds ordered this encounter  Medications  . ezetimibe (ZETIA) 10 MG tablet    Sig: Take 1 tablet (10 mg total) by mouth daily.    Dispense:  90 tablet    Refill:  3    Medications Discontinued During This Encounter  Medication Reason  . oxyCODONE (OXY IR/ROXICODONE) 5 MG immediate release tablet Error    Recommendations:   [name]  is a 74 y.o. Caucasian male  with hypertension, mixed hyperlipidemia, CAD S/P CABG in 2006, mild to moderate LV systolic dysfunction, chronic angina pectoris and left carotid endarterectomy, mild right ICA stenosis and history of possible TIA in August 2018, found to have moderate intracranial disease and PAD with moderate decrease in bilateral ABI and diffuse PAD by angiogram, now on dual antiplatelet therapy.   He is presently doing well and symptoms of angina has remained stable at class II-III.  This probably related to diffuse right coronary artery disease.  PAD symptoms also remained stable, he is very sedentary and only walks with the house with the help of a walker.  He has good capillary refill.  Lipids are not well controlled, I have added Zetia 10 mg daily, due to COVID-19 he has not been able to see Dr. Forde Dandy, I placed orders for labs including CBC, CMP, lipids and TSH.  Like to see him back in 6 months.  Adrian Prows, MD, [facility] 05/29/2019, 2:00 PM [facility] [place] Office: [phone]

## 2019-05-29 NOTE — Patient Instructions (Signed)
I have placed lab orders for you, please go to [facility] and obtain labs in 1 month from now, I have also added Zetia 10 mg be taken after dinner daily to improve your cholesterol.  Do not start any other medications.

## 2019-06-27 LAB — TSH: TSH: 1.96 u[IU]/mL (ref 0.450–4.500)

## 2019-06-27 LAB — CMP14+EGFR
ALT: 12 IU/L (ref 0–44)
AST: 22 IU/L (ref 0–40)
Albumin/Globulin Ratio: 1.6 (ref 1.2–2.2)
Albumin: 4.2 g/dL (ref 3.7–4.7)
Alkaline Phosphatase: 36 IU/L — ABNORMAL LOW (ref 39–117)
BUN/Creatinine Ratio: 16 (ref 10–24)
BUN: 24 mg/dL (ref 8–27)
Bilirubin Total: 0.5 mg/dL (ref 0.0–1.2)
CO2: 24 mmol/L (ref 20–29)
Calcium: 9.4 mg/dL (ref 8.6–10.2)
Chloride: 103 mmol/L (ref 96–106)
Creatinine, Ser: 1.48 mg/dL — ABNORMAL HIGH (ref 0.76–1.27)
GFR calc Af Amer: 53 mL/min/{1.73_m2} — ABNORMAL LOW (ref >59)
GFR calc non Af Amer: 46 mL/min/{1.73_m2} — ABNORMAL LOW (ref >59)
Globulin, Total: 2.6 g/dL (ref 1.5–4.5)
Glucose: 103 mg/dL — ABNORMAL HIGH (ref 65–99)
Potassium: 4.7 mmol/L (ref 3.5–5.2)
Sodium: 140 mmol/L (ref 134–144)
Total Protein: 6.8 g/dL (ref 6.0–8.5)

## 2019-06-27 LAB — CBC
Hematocrit: 38 % (ref 37.5–51.0)
Hemoglobin: 13.2 g/dL (ref 13.0–17.7)
MCH: 32.5 pg (ref 26.6–33.0)
MCHC: 34.7 g/dL (ref 31.5–35.7)
MCV: 94 fL (ref 79–97)
Platelets: 177 10*3/uL (ref 150–450)
RBC: 4.06 x10E6/uL — ABNORMAL LOW (ref 4.14–5.80)
RDW: 13 % (ref 11.6–15.4)
WBC: 6.5 10*3/uL (ref 3.4–10.8)

## 2019-06-27 LAB — LIPID PANEL WITH LDL/HDL RATIO
Cholesterol, Total: 143 mg/dL (ref 100–199)
HDL: 41 mg/dL (ref >39)
LDL Chol Calc (NIH): 80 mg/dL (ref 0–99)
LDL/HDL Ratio: 2 ratio (ref 0.0–3.6)
Triglycerides: 123 mg/dL (ref 0–149)
VLDL Cholesterol Cal: 22 mg/dL (ref 5–40)

## 2019-07-21 ENCOUNTER — Other Ambulatory Visit: Payer: Self-pay | Admitting: Cardiology

## 2019-07-25 ENCOUNTER — Other Ambulatory Visit: Payer: Self-pay

## 2019-07-25 ENCOUNTER — Telehealth: Payer: Self-pay

## 2019-07-25 MED ORDER — ISOSORBIDE MONONITRATE ER 30 MG PO TB24: 60.0000 mg | ORAL_TABLET | Freq: Two times a day (BID) | ORAL | 1 refills | Status: DC

## 2019-07-25 NOTE — Telephone Encounter (Signed)
Called pt no answer, left a vm

## 2019-08-13 ENCOUNTER — Other Ambulatory Visit: Payer: Self-pay | Admitting: Endocrinology

## 2019-08-13 DIAGNOSIS — H05331 Deformity of right orbit due to trauma or surgery: Secondary | ICD-10-CM

## 2019-09-10 ENCOUNTER — Other Ambulatory Visit: Payer: Self-pay

## 2019-09-10 MED ORDER — ISOSORBIDE MONONITRATE ER 30 MG PO TB24: 60.0000 mg | ORAL_TABLET | Freq: Two times a day (BID) | ORAL | 1 refills | Status: DC

## 2019-09-17 ENCOUNTER — Other Ambulatory Visit: Payer: Self-pay

## 2019-09-17 ENCOUNTER — Ambulatory Visit: Payer: Medicare PPO | Admitting: Neurology

## 2019-09-17 ENCOUNTER — Encounter: Payer: Self-pay | Admitting: Neurology

## 2019-09-17 VITALS — BP 125/70 | HR 65 | Ht 69.0 in | Wt 203.0 lb

## 2019-09-17 DIAGNOSIS — Z8673 Personal history of transient ischemic attack (TIA), and cerebral infarction without residual deficits: Secondary | ICD-10-CM

## 2019-09-17 DIAGNOSIS — I739 Peripheral vascular disease, unspecified: Secondary | ICD-10-CM

## 2019-09-17 NOTE — Patient Instructions (Signed)

## 2019-09-17 NOTE — Progress Notes (Signed)
SLEEP MEDICINE CLINIC   Provider:  Shiesha Jahn, M D  Referring Provider: South, [name], MD Primary Care Physician:  South, [name], MD  Chief Complaint  Patient presents with  . Follow-up    pt and spouse , rm 11. pt's wife states he has intermittent choking with swallowing.      Interval : 09-17-2019: I have the pleasure of seeing [name] below today here as a revisit almost 2 years since we last talked the pleasure.  Our last clinic visit was interrupted due to the pandemic.  Mr. Blow and his [place] from now unless there main residence audiograms.  Continues to use a seated walker which looks extremely spitty.  He has had cognitive concerns and memory loss. In July 2019 on a simple Mini-Mental status examination he scored 26 out of 30.  To my surprise he scored 29 out of 30 on the much more difficult Montreal cognitive assessment.  This is a delight to see.  He does indicate that there is some depression maybe some resignation about physical conditions but also about pandemic related activities changing.  He has been an overnight oxygen user at 3 L nasal cannula.  His medications have not changed majorly.  Tylenol and Zetia were removed from his list, he continues on Ranexa, Nitrostat, Cozaar, Linzess, Imdur, fenofibrate, Cymbalta 60 mg at bedtime, Plavix, Coreg, Lipitor, baby aspirin alongside with Plavix, Xanax as needed at bedtime . When anxious and under pressure he feels that his memory slipping, he gets upset about word finding problems, and sometimes about dates and phone numbers.      11-07-2017, Ruari is here seen with his wife, and has petechiae.  He is also evaluated for memory loss and scored very nicely 26 out of 30 points today on the Mini-Mental Status Examination by Folstein.  He sleeps in the night well on 2 liters of oxygen- Fatigue is endorsed at 50/ 63 points, Epworth sleepiness score of 7/24 -year he also has  excellently adjusted to the walker, he is very hard of hearing, he has kidney stones that give him pain- and the medication makes his drowsy. He has swelling in the right flank. I urged him to go to Dr Wrenn.I  think his medical problems, pain and medication- all have a role in fatigue.     06-26-2017, [name] is a 74 y.o., married, caucasian , right handed  male , who is seen here in a RV following his last PSG on Oxygen - from  05-01-2017. I have the pleasure of seeing him today in a revisit on 26 June 2017 in the presence of his wife.  Mr Cryderman was seen in the ED at [facility] and dx with a UTI- but by the time he had undergone MRI and CT brain for confusion.   All negative.  Mr. George see below underwent a sleep study on 01 May 2017 which was meant to be a split-night polysomnography.  He has significant co-morbidities which I have listed at the history section.  Cardiac ejection fraction, CABG, hypertension, he could not tolerate CPAP 25 years ago and uses oxygen at home only.  The sleep study showed 0.0 apneas, it also showed no oxygen desaturation while on the current level of nocturnal oxygen.  Below average sleep efficiency was noted but no physiologic reasons for arousals correlated to the measured channels.  Sleep efficiency still was 75% which meant that he slept for   about 305 minutes at night , just over 5 hours.  He had some irregular beats PACs and PVCs but no evidence of atrial fibrillation. He remained all night on 2 liters of 02. HE doesn't need PAP !  We discuss sleep hygiene today, which affects both spouses. Both have some sleep issues.    I have the pleasure of seeing Mr. Haddaway here today on 24 February 2017 in a revisit.  The patient was diagnosed in August 2018 with a stroke and acute peripheral ischemic focus in the posterior right temporal lobe.  He also had microvascular changes.  Moderate cortical atrophy was noted.  Compensatory ventricular enlargement was seen.MRA  showed left internal carotid artery normal flow both vertebral arteries were with antegrade flow left common carotid artery and bifurcation were patent, all appear to be patent= performed on 06 December 2016. Cognitive evaluation MMSE 28/30 points. He should change to MOCA next time. He walk with a walker. The patient is still on the same medication -  Aggrenox,  The patient indicated he wanted to try a CPAP, instead of just Oxygen at 2 liters . He needs another study- SPLIT study.   Interval history from 11/21/2016. [name] returns today after his MRI from 11/16/2016 documented a fresh, acute right sided brain stroke. In addition the interpretation by Dr. Sager suggested the presence of multiple lacunar infarcts throughout the left brain hemisphere. Main risk factor for these blood colons his hypertension, the main risk factor for his right-sided brain stroke is an vasogenic embolism. The still need to check out that there is no blood clot sitting in his heart, we will order an power Doppler study, if this is negative I would suggest a cardiac monitor to rule out A. fib and I suggest to change him for now on Plavix and aspirin to Aggrenox. Patients on Aggrenox which is take it twice a day, often develop headaches, I usually premedicate with Tylenol. He still resides in [place], and he comes back after 2.5 years . He wears hearing aids, and uses a seated walker. He was followed in the sleep clinic 2015 , but needs now  a memory evaluation , he felt. In the meantime he has developed kidney stones, urinary dribbling, sudden gait disturbance and his wife felt there was a sudden decline- in late April 2018 He continues to use oxygen instead of using CPAP - which he could not tolerate.  His brother in law, an internist, suspected NPH could be present. He has been seen at [facility] in [place], [place] and had back MRIs there.    06-2013 original referral  from Dr. Ganji and NP Sarah Seaver for a sleep  evaluation,[name] was referred after developing angina pectoris with known coronary artery disease, hearing loss and status post CABG . He had a coronary angiogram on 01-01-13 documenting kinesis of the posterior basal segment of the heart with an EF of 40%, mild mitral regurgitation.Right dominant circulation or carotid proximal circumflex and TG toward ICA occluded as well. Diffuse disease in other parts.  The saphenous vein graft from 1996 when he underwent CABG shows some diffuse disease as well. The patient continues to take all antiplatelet therapy as an HbA1c of 5.3,  a total cholesterol of 156.  Additional risk factor management we'll address the possibility of the patient having obstructive sleep apnea.  Dr. Gandji had ordered a nocturnal pulse oximetry,  which documented 16 minutes at or below 88% saturation , and 31 minutes of   less than 89% saturation - desaturation events were 10/hr.  The patient was started by [name]. on oxygen supplementation at 2-1/2 L per minute. The patient states that he feels the best all day close to his bed-time, in return delays going to bed frequently.  Bedtime is  2 AM and he will need 1 to 60 minutes to go to sleep. He takes 2 xanax every night,  Has done for decades.  He used to nap well in his recliner, but now wakes up with angina. He is concerned about his angina when asleep in supine. He goes to sleep on his side, or prone, but wakes up on his back.  He has chronic nasal congestion, he has undergone a septal deviation repair but remains restricted in nasal airflow. He has a mustache. He stopped smoking October 20 1983, after smoking since 1962. He rarely drinks ETOH.  He believes he had never had a stroke, but underwent a Carotid End arterectomy  on the left with a " TIA" , January 2015.  The patient lives In [place] , [place].   01-15-14 Interval history :  Mr. Soward returns after his sleep study , which he found enjoyable. The sleep study took place on  01-01-14 the patient had endorsed CPAP for a score of at 6 points and the fatigue score at 48 points. He is also endorsed the pH Q8 14 points which indicates depression. His AHI was 7.5 which is truly a mild degree his RDI 8.4 in rem sleep his apnea index increased is 33 the pylorus sleep. His lowest oxygen saturation was 82% and is a total time of 15.4 minutes. His heart rate was irregular. I suggested that there could be two treatment options.  #1  REM sleep suppressant medication that would decrease apnea since it was REM dependent and off on the CPAP treatment as longer necessary.  Patient chose cymbalta .   #2 if the patient is loudly snoring and feels that his respiratory disturbance he is high CPAP should be attempted at a lower pressure. REM dependent apnea, patient would like to treat with REM supressent medication. In this case he started Cymbalta.  CO2 retention. Patient is on Oxygen at home, continue O2 supplemented at 2.5 litres. Dr Einar Gip prescriber . He continues cardio rehab, Interval history to-10-16, the patient reports that the Cymbalta has helped him to sleep deeper and sounder. Cymbalta is used as a REM sleep suppressant in his case as he had REM dependent sleep apnea. His wife added that he is also less panicky less anxious. He noticed less anxiety. He still sleeps sounder and falls asleep quicker than he used to. Sleep latency is now less than an hour and maybe 30 minutes. He will have one nocturia.He does not have any headaches in the morning.    Review of Systems: Out of a complete 14 system review, the patient complains of only the following symptoms, and all other reviewed systems are negative. Sleepiness , fatigue, hearing loss, dysphonia of voice.  Dysphagia - Gait  disorder- thought to be orthopedic.  forgetfulnes- now nocturia.  Epworth score  , Fatigue severity score 48 , depression score 7  15  How likely are you to doze in the following situations: 0 = not likely, 1 =  slight chance, 2 = moderate chance, 3 = high chance  Sitting and Reading? Watching Television? Sitting inactive in a public place (theater or meeting)? Lying down in the afternoon when circumstances permit? Sitting and  talking to someone? Sitting quietly after lunch without alcohol? In a car, while stopped for a few minutes in traffic? As a passenger in a car for an hour without a break?  Total =5/ 24     Social History   Socioeconomic History  . Marital status: Married    Spouse name: [name]  . Number of children: 0  . Years of education: Grad. scho  . Highest education level: Not on file  Occupational History  . Not on file  Tobacco Use  . Smoking status: Former Smoker    Packs/day: 2.00    Years: 20.00    Pack years: 40.00    Types: Cigarettes    Quit date: 10/20/1983    Years since quitting: 35.9  . Smokeless tobacco: Never Used  Substance and Sexual Activity  . Alcohol use: Not Currently  . Drug use: Yes    Types: Marijuana    Comment: 11/08/2017 "couple times/month"  . Sexual activity: Not Currently  Other Topics Concern  . 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   Patient is retired.   Patient has a Sports coach.   Patient is right-handed.   Patient drinks 3-4 cups of coffee and three cups of soda/tea per day.   Social Determinants of Health   Financial Resource Strain:   . Difficulty of Paying Living Expenses:   Food Insecurity:   . Worried About Charity fundraiser in the Last Year:   . Arboriculturist in the Last Year:   Transportation Needs:   . Film/video editor (Medical):   Marland Kitchen Lack of Transportation (Non-Medical):   Physical Activity:   . Days of Exercise per Week:   . Minutes of Exercise per Session:   Stress:   . Feeling of Stress :   Social Connections:   . Frequency of Communication with Friends and Family:   . Frequency of Social Gatherings with Friends and  Family:   . Attends Religious Services:   . Active Member of Clubs or Organizations:   . Attends Archivist Meetings:   Marland Kitchen Marital Status:   Intimate Partner Violence:   . Fear of Current or Ex-Partner:   . Emotionally Abused:   Marland Kitchen Physically Abused:   . Sexually Abused:     Past Medical History:  Diagnosis Date  . Anxiety   . CAD (coronary artery disease) of artery bypass graft 12/09/2013  . Chronic lower back pain   . Coronary artery disease   . Dementia ([place])    "step dementia; from mini strokes in 2018" (11/08/2017)  . Depression   . Diabetes mellitus    "totally diet controlled" (11/08/2017)  . Hearing loss    wears bilateral hearing aids  . High cholesterol   . History of kidney stones   . Hypertension   . MI (myocardial infarction) ([place]) [phone]   (several)  . On home oxygen therapy    "2L when he sleeps at night" (11/08/2017)  . Osteoarthritis of both knees    "both knees need replaced" (11/08/2017)  . Sleep apnea    "mild; uses oxygen" (11/08/2017)  . Stroke [facility]) 2018   "several silent ones; a little memory loss; step dementia" (11/08/2017)    Past Surgical History:  Procedure Laterality Date  . 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  CARDIAC CATHETERIZATION     "many"  . CAROTID ENDARTERECTOMY Left   . CORONARY ARTERY BYPASS GRAFT  1996   5 vessels  . CORONARY/GRAFT ANGIOGRAPHY N/A 05/01/2018   Procedure: CORONARY/GRAFT ANGIOGRAPHY;  Surgeon: Adrian Prows, MD;  Location: [facility];  Service: Cardiovascular;  Laterality: N/A;  . FRACTURE SURGERY    . KIDNEY STONE SURGERY  ~ 2014   "laparoscopic, robotic OR"  . LAPAROSCOPIC APPENDECTOMY N/A 06/03/2018   Procedure: APPENDECTOMY LAPAROSCOPIC;  Surgeon: Jovita Kussmaul, MD;  Location: [facility];  Service: General;  Laterality: N/A;  . LOWER EXTREMITY ANGIOGRAPHY Bilateral 05/01/2018   Procedure: LOWER EXTREMITY ANGIOGRAPHY;  Surgeon: Adrian Prows, MD;  Location: [facility];  Service: Cardiovascular;  Laterality: Bilateral;  . PERIPHERAL VASCULAR ATHERECTOMY Left 05/01/2018   Procedure: PERIPHERAL VASCULAR ATHERECTOMY;  Surgeon: Adrian Prows, MD;  Location: [facility];  Service: Cardiovascular;  Laterality: Left;  SFA  . PERIPHERAL VASCULAR BALLOON ANGIOPLASTY  05/01/2018   Procedure: PERIPHERAL VASCULAR BALLOON ANGIOPLASTY;  Surgeon: Adrian Prows, MD;  Location: [facility];  Service: Cardiovascular;;  . WRIST FRACTURE SURGERY Left 2001  . WRIST SURGERY      Current Outpatient Medications  Medication Sig Dispense Refill  . ALPRAZolam (XANAX) 0.5 MG tablet Take 1 tablet (0.5 mg total) at bedtime as needed by mouth. (Patient taking differently: Take 0.5 mg by mouth See admin instructions. Take 0.5 mg at bedtime, may take an additional 0.5 mg dose as needed for anxiety) 45 tablet 0  . aspirin EC 81 MG tablet Take 81 mg by mouth at bedtime.    Marland Kitchen atorvastatin (LIPITOR) 20 MG tablet Take 1 tablet (20 mg total) by mouth at bedtime. 90 tablet 3  . carvedilol (COREG) 25 MG tablet Take 25 mg by mouth 2 (two) times daily with a meal.    . clopidogrel (PLAVIX) 75 MG tablet Take 75 mg by mouth every morning.     . DULoxetine (CYMBALTA) 60 MG capsule Take 60 mg by mouth at bedtime.    . fenofibrate 160 MG tablet Take 160 mg by mouth daily.    . isosorbide mononitrate (IMDUR) 30 MG 24 hr tablet Take 2 tablets (60 mg total) by mouth 2 (two) times daily. 336 tablet 1  . LINZESS 145 MCG CAPS capsule Take 145 mcg by mouth daily as needed (constipation).   12  . losartan (COZAAR) 25 MG tablet Take 25 mg by mouth daily.    . Multiple Vitamins-Minerals (MULTIVITAMIN PO) Take 1 tablet by mouth at bedtime.     . nitroGLYCERIN (NITROSTAT) 0.4 MG SL tablet PLACE 1 TABLET (0.4 MG TOTAL) UNDER THE TONGUE EVERY 5 (FIVE) MINUTES AS NEEDED FOR CHEST PAIN. 75 tablet 2  . ranolazine (RANEXA) 1000 MG SR tablet TAKE 1 TABLET BY MOUTH TWICE A DAY 180  tablet 0   No current facility-administered medications for this visit.    Allergies as of 09/17/2019 - Review Complete 09/17/2019  Allergen Reaction Noted  . Penicillins Other (See Comments) 03/14/2011    Vitals: BP 125/70 (BP Location: Right Arm, Patient Position: Sitting, Cuff Size: Normal)   Pulse 65   Ht 5' 9" (1.753 m)   Wt 203 lb (92.1 kg)   SpO2 98%   BMI 29.98 kg/m  Last Weight:  Wt Readings from Last 1 Encounters:  09/17/19 203 lb (92.1 kg)       Last Height:   Ht Readings from Last 1 Encounters:  09/17/19  5' 9" (1.753 m)    Physical exam:  General: The patient is awake, alert and appears not in acute distress.  Head: Normocephalic, atraumatic. Neck is supple. Mallampati 3 with a short airway, elongate uvula and tonsils, his uvula deviated to the left.   neck circumference: 17 ". Nasal airflow restricted , TMJ is not  evident . Retrognathia is not seen.  Cardiovascular:  Regular rate and rhyth, without  murmurs or carotid bruit, and without distended neck veins. Respiratory: Lungs are clear to auscultation. Skin:  Without evidence of edema, or rash Trunk: BMI is elevated and patient  has normal posture.  Neurologic exam : The patient is awake and alert, oriented to place and time.   Memory subjective  described as intact. There is a normal attention span & concentration ability. Speech is fluent with dysphonia . Mood and affect are appropriate. Montreal Cognitive Assessment  09/17/2019  Visuospatial/ Executive (0/5) 5  Naming (0/3) 3  Attention: Read list of digits (0/2) 2  Attention: Read list of letters (0/1) 1  Attention: Serial 7 subtraction starting at 100 (0/3) 3  Language: Repeat phrase (0/2) 2  Language : Fluency (0/1) 1  Abstraction (0/2) 2  Delayed Recall (0/5) 4  Orientation (0/6) 6  Total 29  Adjusted Score (based on education) 29    MMSE - [place] Exam 11/07/2017 02/24/2017 10/24/2016  Orientation to time _0 Orientation to time  comments - - stated wrong date. 15th instead of 16th  Orientation to Place _1 Registration _2 Attention/ Calculation _3 Recall _4 Language- name 2 objects _5 Language- repeat _6 Language- follow 3 step command _7 Language- read & follow direction _8 Write a sentence _9 Copy design _10 Total score _11 27/30 - missed 2 out of 3 recall words. Will change to [facility] next visit.  Cranial nerves:Pupils are equal and briskly reactive to light. Visual fields by finger perimetry are intact. Hearing to finger rub intact.  Facial sensation intact to fine touch.  Facial motor strength : has a droop of the right face and equal tongue  movement. Equal shoulder shrug  Motor exam:  Elevated tone on the right arm, pronator drift and dysmetria. - this was present in 2015  Gait and station: Patient walks with a walker- that was not the case when last seen.   Deep tendon reflexes: in the  upper and lower extremities are symmetric and intact. Babinski maneuver response is downgoing.   Assessment:  After physical and neurologic examination, review of laboratory studies, imaging, neurophysiology testing and pre-existing records, assessment is   1) CVA - 2019 had a  stroke - right - accounts  for the gait instability, confusion/ memory loss and his urinary problems. His stroke could be off arousal embolic origin, he has an echocardiogram scheduled with Dr. Willaim Bane and 14 days, I would love to have an additional carotid ultrasound study, and to discuss change to Aggrenox from the current medication regimen of Plavix and aspirin.  2) No CPAP needed. He stays on oxygen. High fatigue on oxygen !!  3) He likely had a previous stroke and not a TIA in January 2015. MRI confirmed this- he has lacunes throughout the left brain.  New stroke risk based on EF 40 % , CHF due  to CAD , dyskinetic Heart.   Plan:  Treatment plan and additional workup :   1) Stroke prevention-  Dr  Ganji met with him on 03-06-2017 and reduced HTN medication. He presented here on 06-26-2017, very hypotensive today, had just been at Dr South's office at 10 AM and was 115 mmHg systolic there, but  89 mmHg here ! 2) He looks fine and is not fainty. He stays on oxygen. He doesn't have dementia with MOCA of 29/30 points.   3) continue oxygen - no CPAP need.   RV 6 month with Np or me , he and his wife together, both need epworth score, he needs another MOCA.     MD   ABPN, ABSM and Fellow of the AASM and AAN.   09/17/2019     

## 2019-10-18 ENCOUNTER — Ambulatory Visit: Payer: Medicare PPO

## 2019-10-18 ENCOUNTER — Other Ambulatory Visit: Payer: Self-pay

## 2019-10-18 DIAGNOSIS — Z9889 Other specified postprocedural states: Secondary | ICD-10-CM

## 2019-10-18 DIAGNOSIS — I6521 Occlusion and stenosis of right carotid artery: Secondary | ICD-10-CM

## 2019-10-20 ENCOUNTER — Other Ambulatory Visit: Payer: Self-pay | Admitting: Cardiology

## 2019-10-20 DIAGNOSIS — Z9889 Other specified postprocedural states: Secondary | ICD-10-CM

## 2019-10-20 DIAGNOSIS — I6521 Occlusion and stenosis of right carotid artery: Secondary | ICD-10-CM

## 2019-10-23 ENCOUNTER — Telehealth: Payer: Self-pay

## 2019-10-23 NOTE — Telephone Encounter (Signed)
Discussed results with patient wife. She verbalized understanding.

## 2019-10-23 NOTE — Telephone Encounter (Signed)
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 Will discuss more on OV

## 2019-11-25 ENCOUNTER — Ambulatory Visit: Payer: Medicare Other | Admitting: Orthopaedic Surgery

## 2019-11-27 ENCOUNTER — Ambulatory Visit: Payer: Medicare PPO | Admitting: Cardiology

## 2019-11-29 ENCOUNTER — Other Ambulatory Visit: Payer: Self-pay

## 2019-11-29 ENCOUNTER — Telehealth: Payer: Self-pay

## 2019-11-29 MED ORDER — ISOSORBIDE MONONITRATE ER 30 MG PO TB24: 60.0000 mg | ORAL_TABLET | Freq: Two times a day (BID) | ORAL | 0 refills | Status: DC

## 2019-11-29 NOTE — Telephone Encounter (Signed)
Sent in new script for 360 pills.

## 2019-11-29 NOTE — Telephone Encounter (Signed)
Patient Called and advised that he needs the medication amount to be changed on a RX isosorbide because he is going to run out of medication early . He says that he takes the med four pills a day and was only sent 336 pills instead of 360 . Thank you . .. A.S

## 2019-12-02 ENCOUNTER — Ambulatory Visit: Payer: Medicare PPO | Admitting: Cardiology

## 2019-12-02 ENCOUNTER — Other Ambulatory Visit: Payer: Self-pay

## 2019-12-02 ENCOUNTER — Encounter: Payer: Self-pay | Admitting: Cardiology

## 2019-12-02 VITALS — BP 130/79 | HR 69 | Resp 17 | Ht 69.0 in | Wt 194.8 lb

## 2019-12-02 DIAGNOSIS — I10 Essential (primary) hypertension: Secondary | ICD-10-CM

## 2019-12-02 DIAGNOSIS — I6521 Occlusion and stenosis of right carotid artery: Secondary | ICD-10-CM

## 2019-12-02 DIAGNOSIS — E782 Mixed hyperlipidemia: Secondary | ICD-10-CM

## 2019-12-02 DIAGNOSIS — I25708 Atherosclerosis of coronary artery bypass graft(s), unspecified, with other forms of angina pectoris: Secondary | ICD-10-CM

## 2019-12-02 DIAGNOSIS — Z9889 Other specified postprocedural states: Secondary | ICD-10-CM

## 2019-12-02 MED ORDER — EZETIMIBE 10 MG PO TABS: 10.0000 mg | ORAL_TABLET | Freq: Every day | ORAL | 3 refills | Status: AC

## 2019-12-02 NOTE — Patient Instructions (Signed)
Blood work in 1 month at [facility]

## 2019-12-02 NOTE — Progress Notes (Signed)
Primary Physician/Referring:  [name], MD  Patient ID: [name], male    DOB: 11-19-1945, 74 y.o.   MRN: [id number]  Chief Complaint  Patient presents with  . Hypertension  . Hyperlipidemia  . Coronary Artery Disease  . Follow-up    6 month   HPI:    [name]  is a 74 y.o. Caucasian male  with hypertension, mixed hyperlipidemia, CAD S/P CABG in 2006, mild to moderate LV systolic dysfunction, chronic angina pectoris and left carotid endarterectomy, mild right ICA stenosis and history of possible TIA in August 2018, found to have moderate intracranial disease and PAD with moderate decrease in bilateral ABI and diffuse PAD by angiogram, now on dual antiplatelet therapy.   He is presently doing well and states that anginal symptoms are remained stable. He does use nitroglycerin with relief of chest pain.  Symptoms of claudication are also stable, he is walking within the house with the help of a walker.  He has not noticed any ulceration.  States that his blood pressure has been well controlled.    Past Medical History:  Diagnosis Date  . Anxiety   . CAD (coronary artery disease) of artery bypass graft 12/09/2013  . Chronic lower back pain   . Coronary artery disease   . Dementia (HCC)    "step dementia; from mini strokes in 2018" (11/08/2017)  . Depression   . Diabetes mellitus    "totally diet controlled" (11/08/2017)  . Hearing loss    wears bilateral hearing aids  . High cholesterol   . History of kidney stones   . Hypertension   . MI (myocardial infarction) (HCC) [phone]   (several)  . On home oxygen therapy    "2L when he sleeps at night" (11/08/2017)  . Osteoarthritis of both knees    "both knees need replaced" (11/08/2017)  . Sleep apnea    "mild; uses oxygen" (11/08/2017)  . Stroke [facility]) 2018   "several silent ones; a little memory loss; step dementia" (11/08/2017)   Past Surgical History:  Procedure Laterality Date  . 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 CARDIAC CATHETERIZATION     "many"  . CAROTID ENDARTERECTOMY Left   . CORONARY ARTERY BYPASS GRAFT  1996   5 vessels  . CORONARY/GRAFT ANGIOGRAPHY N/A 05/01/2018   Procedure: CORONARY/GRAFT ANGIOGRAPHY;  Surgeon: George Decamp, MD;  Location: [facility] INVASIVE CV LAB;  Service: Cardiovascular;  Laterality: N/A;  . FRACTURE SURGERY    . KIDNEY STONE SURGERY  ~ 2014   "laparoscopic, robotic OR"  . LAPAROSCOPIC APPENDECTOMY N/A 06/03/2018   Procedure: APPENDECTOMY LAPAROSCOPIC;  Surgeon: Griselda Miner, MD;  Location: [facility] ORS;  Service: General;  Laterality: N/A;  . LOWER EXTREMITY ANGIOGRAPHY Bilateral 05/01/2018   Procedure: LOWER EXTREMITY ANGIOGRAPHY;  Surgeon: George Decamp, MD;  Location: [facility] INVASIVE CV LAB;  Service: Cardiovascular;  Laterality: Bilateral;  . PERIPHERAL VASCULAR ATHERECTOMY Left 05/01/2018   Procedure: PERIPHERAL VASCULAR ATHERECTOMY;  Surgeon: George Decamp, MD;  Location: [facility] INVASIVE CV LAB;  Service: Cardiovascular;  Laterality: Left;  SFA  . PERIPHERAL VASCULAR BALLOON ANGIOPLASTY  05/01/2018   Procedure: PERIPHERAL VASCULAR BALLOON ANGIOPLASTY;  Surgeon: George Decamp, MD;  Location: [facility] INVASIVE CV LAB;  Service: Cardiovascular;;  . WRIST FRACTURE SURGERY Left 2001  . WRIST SURGERY     Family History  Problem Relation Age of Onset  . Heart disease Mother   .  Heart disease Father   . Liver cancer Other   . Parkinson's disease Other   . Heart disease Brother   . Parkinsonism Sister   . Cancer Sister     Social History   Tobacco Use  . Smoking status: Former Smoker    Packs/day: 2.00    Years: 20.00    Pack years: 40.00    Types: Cigarettes    Quit date: 10/20/1983    Years since quitting: 36.1  . Smokeless tobacco: Never Used  Substance Use Topics  . Alcohol use: Not Currently   ROS  Review of Systems  Cardiovascular: Positive for chest pain and leg swelling (stable). Negative for dyspnea on  exertion.  Musculoskeletal: Positive for arthritis and back pain.  Gastrointestinal: Negative for melena.   Objective  Blood pressure 130/79, pulse 69, resp. rate 17, height 5\' 9"  (1.753 m), weight 194 lb 12.8 oz (88.4 kg), SpO2 98 %.  Vitals with BMI 12/02/2019 09/17/2019 05/29/2019  Height 5\' 9"  5\' 9"  5\' 10"   Weight 194 lbs 13 oz 203 lbs 209 lbs 8 oz  BMI 28.75 29.96 30.06  Systolic 130 125 05/31/2019  Diastolic 79 70 68  Pulse 69 65 66     Physical Exam Vitals reviewed.  Constitutional:      Comments: Mildly obese  HENT:     Head: Atraumatic.  Cardiovascular:     Rate and Rhythm: Normal rate and regular rhythm.     Pulses: Intact distal pulses.          Carotid pulses are on the right side with bruit.      Femoral pulses are 1+ on the right side with bruit and 1+ on the left side with bruit.      Popliteal pulses are 0 on the right side and 0 on the left side.       Dorsalis pedis pulses are 1+ on the right side and 1+ on the left side.       Posterior tibial pulses are 0 on the right side and 0 on the left side.     Heart sounds: Murmur heard.  Early systolic murmur is present with a grade of 2/6 at the upper right sternal border radiating to the neck.      Comments: 1+ pitting edema Pulmonary:     Effort: Pulmonary effort is normal. No accessory muscle usage or respiratory distress.     Breath sounds: Normal breath sounds.  Abdominal:     General: Bowel sounds are normal.     Palpations: Abdomen is soft.     Hernia: A hernia is present. Hernia is present in the ventral area.  Musculoskeletal:        General: Normal range of motion.    Laboratory examination:   Recent Labs    06/26/19 1014  NA 140  K 4.7  CL 103  CO2 24  GLUCOSE 103*  BUN 24  CREATININE 1.48*  CALCIUM 9.4  GFRNONAA 46*  GFRAA 53*   CrCl cannot be calculated (Patient's most recent lab result is older than the maximum 21 days allowed.).  CMP Latest Ref Rng & Units 06/26/2019 06/02/2018 05/31/2018   Glucose 65 - 99 mg/dL 06/28/19) 98 06/28/2019)  BUN 8 - 27 mg/dL 24 22 06/04/2018)  Creatinine 0.76 - 1.27 mg/dL 06/02/2018) 956(O) 130(Q)  Sodium 134 - 144 mmol/L 140 137 137  Potassium 3.5 - 5.2 mmol/L 4.7 4.6 4.1  Chloride 96 - 106 mmol/L 103 106 102  CO2  20 - 29 mmol/L 24 26 27   Calcium 8.6 - 10.2 mg/dL 9.4 ) 9.2  Total Protein 6.0 - 8.5 g/dL 6.8 - 7.0  Total Bilirubin 0.0 - 1.2 mg/dL 0.5 - 0.7  Alkaline Phos 39 - 117 IU/L 36(L) - 31(L)  AST 0 - 40 IU/L 22 - 26  ALT 0 - 44 IU/L 12 - 21   CBC Latest Ref Rng & Units 06/26/2019 06/02/2018 05/31/2018  WBC 3.4 - 10.8 x10E3/uL 6.5 7.6 8.2  Hemoglobin 13.0 - 17.7 g/dL 06/02/2018 11.2(L) 12.2(L)  Hematocrit 37.5 - 51.0 % 38.0 34.4(L) 36.6(L)  Platelets 150 - 450 x10E3/uL 177 171 177   External labs 09/10/2018:   Cholesterol, total 148.000 m 09/05/2019 HDL 43 MG/DL 09/07/2019 LDL 6/76/7209 mg 09/05/2019 Triglycerides 100.000 09/05/2019  A1C 4.900 % 09/05/2019. TSH 1.960 06/26/2019  Hemoglobin 12.000 g/d 09/05/2019 NR 1.040 03/29/2017 Platelets 177.000 06/26/2019  Creatinine, Serum 1.400 mg/ 09/05/2019 Potassium 4.700 06/26/2019 Magnesium N/D ALT (SGPT) 15.000 uni 09/05/2019  Cholesterol, total 167.000 m 09/10/2018 HDL 40 MG/DL 11/10/2018 LDL 05/19/3660 m 09/10/2018 Triglycerides 98.000 09/10/2018  A1C 5.200 % 09/10/2018 Hemoglobin 12.900 g/ 09/10/2018 Creatinine, Serum 1.400 mg/ 09/10/2018  TSH 1.730 03/23/2017  Medications and allergies   Allergies  Allergen Reactions  . Penicillins Other (See Comments)    DID THE REACTION INVOLVE: Swelling of the face/tongue/throat, SOB, or low BP? Unknown Sudden or severe rash/hives, skin peeling, or the inside of the mouth or nose? Unknown Did it require medical treatment? Unknown When did it last happen?70+ years ago If all above answers are "NO", may proceed with cephalosporin use.     Current Outpatient Medications  Medication Instructions  . ALPRAZolam (XANAX) 0.5 mg, Oral, At bedtime PRN  . aspirin EC 81  mg, Oral, Daily at bedtime  . atorvastatin (LIPITOR) 20 mg, Oral, Daily at bedtime  . carvedilol (COREG) 25 mg, Oral, 2 times daily with meals  . clopidogrel (PLAVIX) 75 mg, Oral,  Every morning - 10a  . DULoxetine (CYMBALTA) 60 mg, Oral, Daily at bedtime  . ezetimibe (ZETIA) 10 mg, Oral, Daily after supper  . isosorbide mononitrate (IMDUR) 60 mg, Oral, 2 times daily  . Linzess 145 mcg, Oral, Daily PRN  . losartan (COZAAR) 25 mg, Oral, Daily  . Multiple Vitamins-Minerals (MULTIVITAMIN PO) 1 tablet, Oral, Daily at bedtime  . nitroGLYCERIN (NITROSTAT) 0.4 mg, Sublingual, Every 5 min PRN  . ranolazine (RANEXA) 1000 MG SR tablet TAKE 1 TABLET BY MOUTH TWICE A DAY    Radiology:   No results found.  Cardiac Studies:   Coronary angiogram 05/01/2018: Left main patent, diffuse disease of native RCA, LAD circumflex occluded. LIMA to LAD patent, SVG to M2 patent with insertion site mismatch. Known occluded SVG to RCA and SVG to D1.  Peripheral angiogram 05/01/2018: Left SFA tandem 95% stenosis S/P directional atherectomy with ith HawkOne LS and InPact Admiral DCB 5.0 X 250 mm balloon. Left PT occluded. Right ostial SFA 70%, right mid to distal SFA and mid popliteal 95% stenosis, right PT occluded.  ABI 06/04/2018 : This exam reveals moderately decreased perfusion of the right lower extremity, noted at the post tibial artery level (ABI 0.68). This exam reveals mildly decreased perfusion of the left lower extremity, noted at the dorsalis pedis artery level (ABI 0.83).  Compared to 02/14/17, Left ABI has improved from 0.50 and suggests successful revascularization.  Carotid artery duplex  10/18/2019:  Stenosis in the right internal carotid artery (50-69%). Stenosis in the right external carotid artery (<  50%).  Stenosis in the left external carotid artery (<50%).  Antegrade right vertebral artery flow. Antegrade left vertebral artery flow.  Follow up in six months is appropriate if clinically  indicated. No significant change from 04/19/2019.  EKG:  EKG 12/02/2019: Normal sinus rhythm at rate of 69 bpm, left axis deviation, cannot exclude inferior infarct old.  No evidence of ischemia.  Compared to 05/29/2019, frequent PVCs not present.  Lateral ST depression suggestive of LVH not present.  Assessment     ICD-10-CM   1. Coronary artery disease of bypass graft of native heart with stable angina pectoris (HCC)  I25.708 EKG 12-Lead    ezetimibe (ZETIA) 10 MG tablet    Lipid Panel With LDL/HDL Ratio    Lipid Panel With LDL/HDL Ratio  2. Stenosis of right carotid artery  I65.21   3. History of left-sided carotid endarterectomy  Z98.890   4. Primary hypertension  I10   5. Mixed hyperlipidemia  E78.2 ezetimibe (ZETIA) 10 MG tablet    Lipid Panel With LDL/HDL Ratio    Lipid Panel With LDL/HDL Ratio     Meds ordered this encounter  Medications  . ezetimibe (ZETIA) 10 MG tablet    Sig: Take 1 tablet (10 mg total) by mouth daily after supper.    Dispense:  90 tablet    Refill:  3    Medications Discontinued During This Encounter  Medication Reason  . fenofibrate 160 MG tablet Change in therapy    Recommendations:   [name]  is a 74 y.o. Caucasian male  with hypertension, mixed hyperlipidemia, CAD S/P CABG in 2006, mild to moderate LV systolic dysfunction, chronic angina pectoris and left carotid endarterectomy, mild right ICA stenosis and history of possible TIA in August 2018, found to have moderate intracranial disease and PAD with moderate decrease in bilateral ABI and diffuse PAD by angiogram, now on dual antiplatelet therapy.   He is presently doing well and symptoms of angina has remained stable at class II-III.  This probably related to diffuse right coronary artery disease.  PAD symptoms also remained stable, he is very sedentary and only walks with the house with the help of a walker.  He has good capillary refill.  Lipids are not well controlled, I have added  Zetia 10 mg daily, I will check his lipids in 4 to 6 weeks.  Otherwise remained stable from cardiac standpoint with very mild symptoms of claudication and occasional stable angina pectoris.  I will see him back in 6 months.  He will need continued carotid surveillance for mild disease.   George DecampJay Lesle Faron, MD, Share Memorial HospitalFACC 12/02/2019, 6:15 PM Office: [phone]

## 2019-12-20 LAB — LIPID PANEL WITH LDL/HDL RATIO
Cholesterol, Total: 121 mg/dL (ref 100–199)
HDL: 40 mg/dL (ref >39)
LDL Chol Calc (NIH): 63 mg/dL (ref 0–99)
LDL/HDL Ratio: 1.6 ratio (ref 0.0–3.6)
Triglycerides: 94 mg/dL (ref 0–149)
VLDL Cholesterol Cal: 18 mg/dL (ref 5–40)

## 2019-12-20 NOTE — Progress Notes (Signed)
Spoke with pt, pt voiced understanding.

## 2019-12-20 NOTE — Progress Notes (Signed)
NOrmal lipids

## 2019-12-20 NOTE — Progress Notes (Signed)
Left vm to cb.

## 2019-12-27 ENCOUNTER — Other Ambulatory Visit: Payer: Self-pay | Admitting: Cardiology

## 2020-02-21 ENCOUNTER — Other Ambulatory Visit: Payer: Self-pay | Admitting: Cardiology

## 2020-03-11 ENCOUNTER — Other Ambulatory Visit: Payer: Self-pay | Admitting: Cardiology

## 2020-03-11 DIAGNOSIS — I209 Angina pectoris, unspecified: Secondary | ICD-10-CM

## 2020-03-23 ENCOUNTER — Ambulatory Visit: Payer: Medicare PPO | Admitting: Neurology

## 2020-03-23 ENCOUNTER — Encounter: Payer: Self-pay | Admitting: Neurology

## 2020-03-23 ENCOUNTER — Other Ambulatory Visit: Payer: Self-pay

## 2020-03-23 VITALS — BP 102/64 | HR 69 | Ht 69.0 in | Wt 185.0 lb

## 2020-03-23 DIAGNOSIS — I25708 Atherosclerosis of coronary artery bypass graft(s), unspecified, with other forms of angina pectoris: Secondary | ICD-10-CM | POA: Diagnosis not present

## 2020-03-23 DIAGNOSIS — I5032 Chronic diastolic (congestive) heart failure: Secondary | ICD-10-CM

## 2020-03-23 DIAGNOSIS — E1122 Type 2 diabetes mellitus with diabetic chronic kidney disease: Secondary | ICD-10-CM

## 2020-03-23 DIAGNOSIS — I739 Peripheral vascular disease, unspecified: Secondary | ICD-10-CM

## 2020-03-23 DIAGNOSIS — N183 Chronic kidney disease, stage 3 unspecified: Secondary | ICD-10-CM

## 2020-03-23 DIAGNOSIS — G3184 Mild cognitive impairment, so stated: Secondary | ICD-10-CM

## 2020-03-23 DIAGNOSIS — R2990 Unspecified symptoms and signs involving the nervous system: Secondary | ICD-10-CM | POA: Diagnosis not present

## 2020-03-23 DIAGNOSIS — Z9981 Dependence on supplemental oxygen: Secondary | ICD-10-CM

## 2020-03-23 NOTE — Patient Instructions (Signed)

## 2020-03-23 NOTE — Progress Notes (Signed)
SLEEP MEDICINE CLINIC   Provider:  Larey Seat, M D  Referring Provider: Reynold Bowen, MD Primary Care Physician:  Reynold Bowen, MD  Chief Complaint  Patient presents with  . Follow-up    pt and spouse , rm 80. pt's wife states he has intermittent choking with swallowing.      Interval history - 03-23-2020; mr. Brandi C. Corea is presenting with a need for cataract surgery later this month.  He is using a seated walker, has limited ambulatory strength. He feels his knees buckle and he can't rise easily from a seated position. He uses CBD oil as ointment.  Wears hearing aids, looks a little ruddy today.       Interval : 09-17-2019: I have the pleasure of seeing Jerene Bears below today here as a revisit almost 2 years since we last talked the pleasure.  Our last clinic visit was interrupted due to the pandemic.  Mr. Melinda Crutch and [place] from now unless there main residence audiograms.  Continues to use a seated walker which looks extremely spitty.  He has had cognitive concerns and memory loss. In July 2019 on a simple Mini-Mental status examination he scored 26 out of 30.  To my surprise he scored 29 out of 30 on the much more difficult Montreal cognitive assessment.  This is a delight to see.  He does indicate that there is some depression maybe some resignation about physical conditions but also about pandemic related activities changing.  He has been an overnight oxygen user at 3 L nasal cannula.  His medications have not changed majorly.  Tylenol and Zetia were removed from his list, he continues on Ranexa, Nitrostat, Cozaar, Linzess, Imdur, fenofibrate, Cymbalta 60 mg at bedtime, Plavix, Coreg, Lipitor, baby aspirin alongside with Plavix, Xanax as needed at bedtime . When anxious and under pressure he feels that his memory slipping, he gets upset about word finding problems, and sometimes about dates and phone numbers.    11-07-2017,  Demetrice is here seen with his wife, and has petechiae.  He is also evaluated for memory loss and scored very nicely 26 out of 30 points today on the Mini-Mental Status Examination by Lillia Corporal.  He sleeps in the night well on 2 liters of oxygen- Fatigue is endorsed at 50/ 63 points, Epworth sleepiness score of 7/24 74-year he also has excellently adjusted to the walker, he is very hard of hearing, he has kidney stones that give him pain- and the medication makes his drowsy. He has swelling in the right flank. I urged him to go to Dr Jeffie Pollock.I  think his medical problems, pain and medication- all have a role in fatigue.     06-26-2017, SAFI CULOTTA is a 74 y.o., married, caucasian , right handed  male , who is seen here in a RV following his last PSG on Oxygen - from  05-01-2017. I have the pleasure of seeing him today in a revisit on 26 June 2017 in the presence of his wife.  Mr Tsutsui was seen in the ED at [facility] and dx with a UTI- but by the time he had undergone MRI and CT brain for confusion.   All negative.  [name] see below underwent a sleep study on 01 May 2017 which was meant to be a split-night polysomnography.  He has significant co-morbidities which I have listed at the history section.  Cardiac ejection fraction, CABG, hypertension, he could  not tolerate CPAP 25 years ago and uses oxygen at home only.  The sleep study showed 0.0 apneas, it also showed no oxygen desaturation while on the current level of nocturnal oxygen.  Below average sleep efficiency was noted but no physiologic reasons for arousals correlated to the measured channels.  Sleep efficiency still was 75% which meant that he slept for about 305 minutes at night , just over 5 hours.  He had some irregular beats PACs and PVCs but no evidence of atrial fibrillation. He remained all night on 2 liters of 02. HE doesn't need PAP !  We discuss sleep hygiene today, which affects both spouses. Both have some sleep issues.    I have the  pleasure of seeing Mr. Ursua here today on 24 February 2017 in a revisit.  The patient was diagnosed in August 2018 with a stroke and acute peripheral ischemic focus in the posterior right temporal lobe.  He also had microvascular changes.  Moderate cortical atrophy was noted.  Compensatory ventricular enlargement was seen.MRA showed left internal carotid artery normal flow both vertebral arteries were with antegrade flow left common carotid artery and bifurcation were patent, all appear to be patent= performed on 06 December 2016. Cognitive evaluation MMSE 28/30 points. He should change to [facility] next time. He walk with a walker. The patient is still on the same medication -  Aggrenox,  The patient indicated he wanted to try a CPAP, instead of just Oxygen at 2 liters . He needs another study- SPLIT study.   Interval history from 11/21/2016. Mr. Kinta Martis returns today after his MRI from 11/16/2016 documented a fresh, acute right sided brain stroke. In addition the interpretation by Dr. Ladonna Snide suggested the presence of multiple lacunar infarcts throughout the left brain hemisphere. Main risk factor for these blood colons his hypertension, the main risk factor for his right-sided brain stroke is an vasogenic embolism. The still need to check out that there is no blood clot sitting in his heart, we will order an power Doppler study, if this is negative I would suggest a cardiac monitor to rule out A. fib and I suggest to change him for now on Plavix and aspirin to Aggrenox. Patients on Aggrenox which is take it twice a day, often develop headaches, I usually premedicate with Tylenol. He still resides in [facility], and he comes back after 2.5 years . He wears hearing aids, and uses a seated walker. He was followed in the sleep clinic 2015 , but needs now  a memory evaluation , he felt. In the meantime he has developed kidney stones, urinary dribbling, sudden gait disturbance and his wife felt there was a sudden  decline- in late April 2018 He continues to use oxygen instead of using CPAP - which he could not tolerate.  His brother in law, an internist, suspected NPH could be present. He has been seen at [facility] in [place], [place] and had back MRIs there.    06-2013 original referral  from Dr. Einar Gip and [name] for a sleep evaluation,Mr. Willmore was referred after developing angina pectoris with known coronary artery disease, hearing loss and status post CABG . He had a coronary angiogram on 01-01-13 documenting kinesis of the posterior basal segment of the heart with an EF of 40%, mild mitral regurgitation.Right dominant circulation or carotid proximal circumflex and TG toward ICA occluded as well. Diffuse disease in other parts.  The saphenous vein graft from 1996 when he underwent CABG shows some  diffuse disease as well. The patient continues to take all antiplatelet therapy as an HbA1c of 5.3,  a total cholesterol of 156.  Additional risk factor management we'll address the possibility of the patient having obstructive sleep apnea.  [name] had ordered a nocturnal pulse oximetry,  which documented 16 minutes at or below 88% saturation , and 31 minutes of less than 89% saturation - desaturation events were 10/hr.  The patient was started by [name]. on oxygen supplementation at 2-1/2 L per minute. The patient states that he feels the best all day close to his bed-time, in return delays going to bed frequently.  Bedtime is  2 AM and he will need 1 to 60 minutes to go to sleep. He takes 2 xanax every night,  Has done for decades.  He used to nap well in his recliner, but now wakes up with angina. He is concerned about his angina when asleep in supine. He goes to sleep on his side, or prone, but wakes up on his back.  He has chronic nasal congestion, he has undergone a septal deviation repair but remains restricted in nasal airflow. He has a mustache. He stopped smoking October 20 1983, after smoking since  1962. He rarely drinks ETOH.  He believes he had never had a stroke, but underwent a Carotid End arterectomy  on the left with a " TIA" , January 2015.  The patient lives In [place] , [place].   01-15-14 Interval history :  Mr. Scheaffer returns after his sleep study , which he found enjoyable. The sleep study took place on 01-01-14 the patient had endorsed CPAP for a score of at 6 points and the fatigue score at 48 points. He is also endorsed the pH Q8 14 points which indicates depression. His AHI was 7.5 which is truly a mild degree his RDI 8.4 in rem sleep his apnea index increased is 33 the pylorus sleep. His lowest oxygen saturation was 82% and is a total time of 15.4 minutes. His heart rate was irregular. I suggested that there could be two treatment options.  #1  REM sleep suppressant medication that would decrease apnea since it was REM dependent and off on the CPAP treatment as longer necessary.  Patient chose cymbalta .   #2 if the patient is loudly snoring and feels that his respiratory disturbance he is high CPAP should be attempted at a lower pressure. REM dependent apnea, patient would like to treat with REM supressent medication. In this case he started Cymbalta.  CO2 retention. Patient is on Oxygen at home, continue O2 supplemented at 2.5 litres. Dr Einar Gip prescriber . He continues cardio rehab, Interval history to-10-16, the patient reports that the Cymbalta has helped him to sleep deeper and sounder. Cymbalta is used as a REM sleep suppressant in his case as he had REM dependent sleep apnea. His wife added that he is also less panicky less anxious. He noticed less anxiety. He still sleeps sounder and falls asleep quicker than he used to. Sleep latency is now less than an hour and maybe 30 minutes. He will have one nocturia.He does not have any headaches in the morning.    Review of Systems: Out of a complete 14 system review, the patient complains of only the following symptoms, and all  other reviewed systems are negative. Sleepiness , fatigue, hearing loss, dysphonia of voice.  Dysphagia - Gait  disorder- thought to be orthopedic.  forgetfulnes- now nocturia.  Epworth score  ,  Fatigue severity score 48 , depression score 7  15  How likely are you to doze in the following situations: 0 = not likely, 1 = slight chance, 2 = moderate chance, 3 = high chance  Sitting and Reading? Watching Television? Sitting inactive in a public place (theater or meeting)? Lying down in the afternoon when circumstances permit? Sitting and talking to someone? Sitting quietly after lunch without alcohol? In a car, while stopped for a few minutes in traffic? As a passenger in a car for an hour without a break?  Total =8/ 24     Social History   Socioeconomic History  . Marital status: Married    Spouse name: [name]  . Number of children: 0  . Years of education: Grad. scho  . Highest education level: Not on file  Occupational History  . Not on file  Tobacco Use  . Smoking status: Former Smoker    Packs/day: 2.00    Years: 20.00    Pack years: 40.00    Types: Cigarettes    Quit date: 10/20/1983    Years since quitting: 36.4  . Smokeless tobacco: Never Used  Vaping Use  . Vaping Use: Never used  Substance and Sexual Activity  . Alcohol use: Not Currently  . Drug use: Yes    Types: Marijuana    Comment: 11/08/2017 "couple times/month"  . Sexual activity: Not Currently  Other Topics Concern  . 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   Patient is retired.   Patient has a Sports coach.   Patient is right-handed.   Patient drinks 3-4 cups of coffee and three cups of soda/tea per day.   Social Determinants of Health   Financial Resource Strain: Not on file  Food Insecurity: Not on file  Transportation Needs: Not on file  Physical Activity: Not on file  Stress: Not on file  Social Connections:  Not on file  Intimate Partner Violence: Not on file    Past Medical History:  Diagnosis Date  . Anxiety   . CAD (coronary artery disease) of artery bypass graft 12/09/2013  . Chronic lower back pain   . Coronary artery disease   . Dementia ([place])    "step dementia; from mini strokes in 2018" (11/08/2017)  . Depression   . Diabetes mellitus    "totally diet controlled" (11/08/2017)  . Hearing loss    wears bilateral hearing aids  . High cholesterol   . History of kidney stones   . Hypertension   . MI (myocardial infarction) ([place]) [phone]   (several)  . On home oxygen therapy    "2L when he sleeps at night" (11/08/2017)  . Osteoarthritis of both knees    "both knees need replaced" (11/08/2017)  . Sleep apnea    "mild; uses oxygen" (11/08/2017)  . Stroke [facility]) 2018   "several silent ones; a little memory loss; step dementia" (11/08/2017)    Past Surgical History:  Procedure Laterality Date  . 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 CARDIAC CATHETERIZATION     "many"  . CAROTID ENDARTERECTOMY Left   . CORONARY ARTERY BYPASS GRAFT  1996   5 vessels  . CORONARY/GRAFT ANGIOGRAPHY N/A 05/01/2018   Procedure: CORONARY/GRAFT ANGIOGRAPHY;  Surgeon: Adrian Prows, MD;  Location: [place] [facility];  Service: Cardiovascular;  Laterality: N/A;  . FRACTURE  SURGERY    . KIDNEY STONE SURGERY  ~ 2014   "laparoscopic, robotic OR"  . LAPAROSCOPIC APPENDECTOMY N/A 06/03/2018   Procedure: APPENDECTOMY LAPAROSCOPIC;  Surgeon: Jovita Kussmaul, MD;  Location: [facility];  Service: General;  Laterality: N/A;  . LOWER EXTREMITY ANGIOGRAPHY Bilateral 05/01/2018   Procedure: LOWER EXTREMITY ANGIOGRAPHY;  Surgeon: Adrian Prows, MD;  Location: [place] [facility];  Service: Cardiovascular;  Laterality: Bilateral;  . PERIPHERAL VASCULAR ATHERECTOMY Left 05/01/2018   Procedure: PERIPHERAL VASCULAR ATHERECTOMY;  Surgeon: Adrian Prows, MD;  Location: [place]  [facility];  Service: Cardiovascular;  Laterality: Left;  SFA  . PERIPHERAL VASCULAR BALLOON ANGIOPLASTY  05/01/2018   Procedure: PERIPHERAL VASCULAR BALLOON ANGIOPLASTY;  Surgeon: Adrian Prows, MD;  Location: [place] [facility];  Service: Cardiovascular;;  . WRIST FRACTURE SURGERY Left 2001  . WRIST SURGERY      Current Outpatient Medications  Medication Sig Dispense Refill  . ALPRAZolam (XANAX) 0.5 MG tablet Take 1 tablet (0.5 mg total) at bedtime as needed by mouth. (Patient taking differently: Take 0.5 mg by mouth See admin instructions. Take 0.5 mg at bedtime, may take an additional 0.5 mg dose as needed for anxiety) 45 tablet 0  . aspirin EC 81 MG tablet Take 81 mg by mouth at bedtime.    Marland Kitchen atorvastatin (LIPITOR) 20 MG tablet Take 1 tablet (20 mg total) by mouth at bedtime. 90 tablet 3  . carvedilol (COREG) 25 MG tablet Take 25 mg by mouth 2 (two) times daily with a meal.    . clopidogrel (PLAVIX) 75 MG tablet Take 75 mg by mouth every morning.     . DULoxetine (CYMBALTA) 60 MG capsule Take 60 mg by mouth at bedtime.    Marland Kitchen ezetimibe (ZETIA) 10 MG tablet Take 1 tablet (10 mg total) by mouth daily after supper. 90 tablet 3  . isosorbide mononitrate (IMDUR) 30 MG 24 hr tablet TAKE 2 TABLETS (60 MG TOTAL) BY MOUTH 2 (TWO) TIMES DAILY. 360 tablet 0  . LINZESS 145 MCG CAPS capsule Take 145 mcg by mouth daily as needed (constipation).   12  . losartan (COZAAR) 25 MG tablet Take 25 mg by mouth daily.    . Multiple Vitamins-Minerals (MULTIVITAMIN PO) Take 1 tablet by mouth at bedtime.     . nitroGLYCERIN (NITROSTAT) 0.4 MG SL tablet PLACE 1 TABLET (0.4 MG TOTAL) UNDER THE TONGUE EVERY 5 (FIVE) MINUTES AS NEEDED FOR CHEST PAIN. 75 tablet 2  . ranolazine (RANEXA) 1000 MG SR tablet TAKE 1 TABLET BY MOUTH TWICE A DAY 180 tablet 0   No current facility-administered medications for this visit.    Allergies as of 03/23/2020 - Review Complete 03/23/2020  Allergen Reaction Noted  . Penicillins Other (See  Comments) 03/14/2011    Vitals: BP 102/64   Pulse 69   Ht $R'5\' 9"'HA$  (1.753 m)   Wt 185 lb (83.9 kg)   BMI 27.32 kg/m  Last Weight:  Wt Readings from Last 1 Encounters:  03/23/20 185 lb (83.9 kg)       Last Height:   Ht Readings from Last 1 Encounters:  03/23/20 $RemoveB'5\' 9"'ivvNkaAw$  (1.753 m)    Physical exam:  General: The patient is awake, alert and appears not in acute distress.  Head: Normocephalic, atraumatic. Neck is supple. Mallampati 3 with a short airway, elongate uvula and tonsils, his uvula deviated to the left.   neck circumference: 17 ". Nasal airflow restricted , TMJ is not  evident . Retrognathia is  not seen.  Cardiovascular:  Regular rate and rhyth, without  murmurs or carotid bruit, and without distended neck veins. Respiratory: Lungs are clear to auscultation. Skin:  Without evidence of edema, or rash Trunk: BMI is elevated and patient  has normal posture.  Neurologic exam : The patient is awake and alert, oriented to place and time.   Memory subjective  described as intact. There is a normal attention span & concentration ability. Speech is fluent with dysphonia . Mood and affect are appropriate. Montreal Cognitive Assessment  09/17/2019  Visuospatial/ Executive (0/5) 5  Naming (0/3) 3  Attention: Read list of digits (0/2) 2  Attention: Read list of letters (0/1) 1  Attention: Serial 7 subtraction starting at 100 (0/3) 3  Language: Repeat phrase (0/2) 2  Language : Fluency (0/1) 1  Abstraction (0/2) 2  Delayed Recall (0/5) 4  Orientation (0/6) 6  Total 29  Adjusted Score (based on education) 29    MMSE - Berry Exam 03/23/2020 11/07/2017 02/24/2017  Orientation to time 5 3 4   Orientation to time comments - - -  Orientation to Place 5 4 5   Registration 3 3 3   Attention/ Calculation 4 4 5   Recall 1 3 3   Language- name 2 objects 2 2 2   Language- repeat 1 1 1   Language- follow 3 step command 3 3 2   Language- read & follow direction 1 1 1   Write a sentence 1  1 1   Copy design 0 1 1  Total score 26 26 28    MMSE again 26/30 - missed 2 out of 3 recall words. Will change to [place] next visit.   Cranial nerves:Pupils are equal and briskly reactive to light. Visual fields by finger perimetry are intact. He has normal EOM- no ptosis.  Hearing to finger rub intact while is hearing aids are in place.Facial sensation intact to fine touch.  Facial motor strength :  has a droop of the right face and equal tongue  movement. Equal shoulder shrug   Motor exam:  Elevated tone  ( mildly) on the right arm, pronator drift and dysmetria. - this was present in 2015 - no cogwheeling.  Coordination- he had trouble with finger to nose- ataxia.  Gait and station: Patient walks with a walker- and is very SOB. May be because he gave his 6 to his wife.  Deep tendon reflexes: in the  upper and lower extremities are symmetric and intact.  Babinski maneuver response was deferred.   Assessment:  After physical and neurologic examination, review of laboratory studies, imaging, neurophysiology testing and pre-existing records, assessment is:    1) CVA - 2019 had a  stroke - right - accounts  for the gait instability, confusion/ memory loss and his urinary problems. His stroke could be off arousal embolic origin, he has an echocardiogram scheduled with Dr. Willaim Bane and 14 days, I would love to have an additional carotid ultrasound study, and to discuss change to Aggrenox from the current medication regimen of Plavix and aspirin.  No CPAP needed. He stays on oxygen. High fatigue on oxygen !! I like for him to return to 02 at night.   3) He likely had a previous stroke and not a TIA in January 2015. MRI confirmed this- he has lacunes throughout the left brain.  New stroke risk based on EF 40 % , CHF due to CAD , dyskinetic Heart.   Plan:  Treatment plan and additional workup :   1) Stroke  prevention-  Dr Einar Gip met with him on 03-06-2017 and reduced HTN medication.  2) he is  declining ambulatory- wise- I like for him to get some PT, fall prevention.  He uses a walker. He needs to be building up strength.  3)  He is reporting poorly restorative  sleep- He stays on oxygen at night, but relinquished his machine to wife Rosaly.  There has been no consequent day and night rhythm in both spouses.  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    RV 4 month with NP or me , he and his wife together, schedule together in following appointments  will both need Epworth score, he needs another MOCA.   Larey Seat MD   ABPN, ABSM and Fellow of the AASM and AAN.   03/23/2020

## 2020-04-07 ENCOUNTER — Other Ambulatory Visit: Payer: Self-pay | Admitting: Cardiology

## 2020-05-13 ENCOUNTER — Other Ambulatory Visit: Payer: Self-pay | Admitting: Cardiology

## 2020-05-20 ENCOUNTER — Other Ambulatory Visit: Payer: Self-pay

## 2020-05-20 ENCOUNTER — Ambulatory Visit: Payer: Medicare PPO

## 2020-05-20 DIAGNOSIS — I6521 Occlusion and stenosis of right carotid artery: Secondary | ICD-10-CM

## 2020-05-20 DIAGNOSIS — Z9889 Other specified postprocedural states: Secondary | ICD-10-CM

## 2020-05-22 ENCOUNTER — Other Ambulatory Visit: Payer: Self-pay | Admitting: Cardiology

## 2020-05-26 ENCOUNTER — Other Ambulatory Visit: Payer: Self-pay | Admitting: Cardiology

## 2020-05-26 DIAGNOSIS — Z9889 Other specified postprocedural states: Secondary | ICD-10-CM

## 2020-05-26 DIAGNOSIS — I6521 Occlusion and stenosis of right carotid artery: Secondary | ICD-10-CM

## 2020-05-30 ENCOUNTER — Other Ambulatory Visit: Payer: Self-pay | Admitting: Cardiology

## 2020-05-30 DIAGNOSIS — I209 Angina pectoris, unspecified: Secondary | ICD-10-CM

## 2020-06-03 ENCOUNTER — Ambulatory Visit: Payer: Medicare PPO | Admitting: Student

## 2020-06-03 ENCOUNTER — Encounter: Payer: Self-pay | Admitting: Student

## 2020-06-03 ENCOUNTER — Ambulatory Visit: Payer: Medicare PPO | Admitting: Cardiology

## 2020-06-03 ENCOUNTER — Other Ambulatory Visit: Payer: Self-pay

## 2020-06-03 VITALS — BP 128/64 | HR 65 | Temp 97.8°F | Resp 17 | Ht 69.0 in | Wt 183.0 lb

## 2020-06-03 DIAGNOSIS — E782 Mixed hyperlipidemia: Secondary | ICD-10-CM

## 2020-06-03 DIAGNOSIS — I25708 Atherosclerosis of coronary artery bypass graft(s), unspecified, with other forms of angina pectoris: Secondary | ICD-10-CM

## 2020-06-03 DIAGNOSIS — I10 Essential (primary) hypertension: Secondary | ICD-10-CM

## 2020-06-03 DIAGNOSIS — I6521 Occlusion and stenosis of right carotid artery: Secondary | ICD-10-CM

## 2020-06-03 DIAGNOSIS — I739 Peripheral vascular disease, unspecified: Secondary | ICD-10-CM

## 2020-06-03 NOTE — Progress Notes (Signed)
Primary Physician/Referring:  [name], MD  Patient ID: [name], male    DOB: 1945-09-22, 75 y.o.   MRN: [id number]  Chief Complaint  Patient presents with  . Follow-up  . Coronary Artery Disease  . Hypertension  . Hyperlipidemia   HPI:    DEANTRE BOURDON  is a 75 y.o. Caucasian male  with hypertension, mixed hyperlipidemia, CAD S/P CABG in 2006, mild to moderate LV systolic dysfunction, chronic angina pectoris and left carotid endarterectomy, mild right ICA stenosis and history of possible TIA in August 2018, found to have moderate intracranial disease and PAD with moderate decrease in bilateral ABI and diffuse PAD by angiogram, now on dual antiplatelet therapy.   Patient presents for 24-month follow-up of hypertension, hyperlipidemia, CAD.  Since last visit lipids are now well controlled with the addition of Zetia 10 mg daily. He reports anginal symptoms have remained stable, and he continues to use nitroglycerin as needed to relieve the chest pain. He does note that he has been under more stress lately as his wife has recently been admitted to hospice care. He uses a walker to walk around the house, but is otherwise relatively inactive. He denies symptoms of claudication or rest medication or rest pain, denies ulceration.   He brings with him DNR DNR paperwork to be completed.  Past Medical History:  Diagnosis Date  . Anxiety   . CAD (coronary artery disease) of artery bypass graft 12/09/2013  . Chronic lower back pain   . Coronary artery disease   . Dementia (HCC)    "step dementia; from mini strokes in 2018" (11/08/2017)  . Depression   . Diabetes mellitus    "totally diet controlled" (11/08/2017)  . Hearing loss    wears bilateral hearing aids  . High cholesterol   . History of kidney stones   . Hypertension   . MI (myocardial infarction) (HCC) [phone]   (several)  . On home oxygen therapy    "2L when he sleeps at night" (11/08/2017)  . Osteoarthritis of both  knees    "both knees need replaced" (11/08/2017)  . Sleep apnea    "mild; uses oxygen" (11/08/2017)  . Stroke [facility]) 2018   "several silent ones; a little memory loss; step dementia" (11/08/2017)   Past Surgical History:  Procedure Laterality Date  . 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 CARDIAC CATHETERIZATION     "many"  . CAROTID ENDARTERECTOMY Left   . CORONARY ARTERY BYPASS GRAFT  1996   5 vessels  . CORONARY/GRAFT ANGIOGRAPHY N/A 05/01/2018   Procedure: CORONARY/GRAFT ANGIOGRAPHY;  Surgeon: Yates Decamp, MD;  Location: [facility] INVASIVE CV LAB;  Service: Cardiovascular;  Laterality: N/A;  . FRACTURE SURGERY    . KIDNEY STONE SURGERY  ~ 2014   "laparoscopic, robotic OR"  . LAPAROSCOPIC APPENDECTOMY N/A 06/03/2018   Procedure: APPENDECTOMY LAPAROSCOPIC;  Surgeon: Griselda Miner, MD;  Location: [facility] ORS;  Service: General;  Laterality: N/A;  . LOWER EXTREMITY ANGIOGRAPHY Bilateral 05/01/2018   Procedure: LOWER EXTREMITY ANGIOGRAPHY;  Surgeon: Yates Decamp, MD;  Location: [facility] INVASIVE CV LAB;  Service: Cardiovascular;  Laterality: Bilateral;  . PERIPHERAL VASCULAR ATHERECTOMY Left 05/01/2018   Procedure: PERIPHERAL VASCULAR ATHERECTOMY;  Surgeon: Yates Decamp, MD;  Location: [facility] INVASIVE CV LAB;  Service: Cardiovascular;  Laterality: Left;  SFA  . PERIPHERAL VASCULAR BALLOON ANGIOPLASTY  05/01/2018   Procedure: PERIPHERAL VASCULAR BALLOON ANGIOPLASTY;  Surgeon: Jacinto Halim,  Vonna KotykJay, MD;  Location: [facility] INVASIVE CV LAB;  Service: Cardiovascular;;  . WRIST FRACTURE SURGERY Left 2001  . WRIST SURGERY     Family History  Problem Relation Age of Onset  . Heart disease Mother   . Heart disease Father   . Liver cancer Other   . Parkinson's disease Other   . Heart disease Brother   . Parkinsonism Sister   . Cancer Sister     Social History   Tobacco Use  . Smoking status: Former Smoker    Packs/day: 2.00    Years: 20.00    Pack years: 40.00     Types: Cigarettes    Quit date: 10/20/1983    Years since quitting: 36.6  . Smokeless tobacco: Never Used  Substance Use Topics  . Alcohol use: Not Currently   ROS  Review of Systems  Constitutional: Negative for malaise/fatigue and weight gain.  Cardiovascular: Positive for chest pain (stable, chronic) and leg swelling (stable). Negative for claudication, dyspnea on exertion, near-syncope, orthopnea, palpitations, paroxysmal nocturnal dyspnea and syncope.  Respiratory: Negative for shortness of breath.   Hematologic/Lymphatic: Does not bruise/bleed easily.  Musculoskeletal: Positive for arthritis and back pain.  Gastrointestinal: Negative for melena.  Neurological: Negative for dizziness and weakness.   Objective  Blood pressure 128/64, pulse 65, temperature 97.8 F (36.6 C), resp. rate 17, height 5\' 9"  (1.753 m), weight 183 lb (83 kg), SpO2 95 %.  Vitals with BMI 06/03/2020 03/23/2020 12/02/2019  Height 5\' 9"  5\' 9"  5\' 9"   Weight 183 lbs 185 lbs 194 lbs 13 oz  BMI 27.01 27.31 28.75  Systolic 128 102 [id number]  Diastolic 64 64 79  Pulse 65 69 69     Physical Exam Vitals reviewed.  Constitutional:      Comments: Mildly obese  HENT:     Head: Atraumatic.  Cardiovascular:     Rate and Rhythm: Normal rate and regular rhythm.     Pulses: Intact distal pulses.          Carotid pulses are on the right side with bruit.      Femoral pulses are 1+ on the right side with bruit and 1+ on the left side with bruit.      Popliteal pulses are 0 on the right side and 0 on the left side.       Dorsalis pedis pulses are 1+ on the right side and 1+ on the left side.       Posterior tibial pulses are 0 on the right side and 0 on the left side.     Heart sounds: Murmur heard.   Early systolic murmur is present with a grade of 2/6 at the upper right sternal border radiating to the neck.     Comments: 1+ pitting edema Pulmonary:     Effort: Pulmonary effort is normal. No accessory muscle usage or  respiratory distress.     Breath sounds: Normal breath sounds.  Abdominal:     General: Bowel sounds are normal.     Palpations: Abdomen is soft.     Hernia: A hernia is present. Hernia is present in the ventral area.  Musculoskeletal:        General: Normal range of motion.     Right lower leg: Edema present.     Left lower leg: Edema present.    Laboratory examination:   Recent Labs    06/26/19 1014  NA 140  K 4.7  CL 103  CO2 24  GLUCOSE  103*  BUN 24  CREATININE 1.48*  CALCIUM 9.4  GFRNONAA 46*  GFRAA 53*   CrCl cannot be calculated (Patient's most recent lab result is older than the maximum 21 days allowed.).  CMP Latest Ref Rng & Units 06/26/2019 06/02/2018 05/31/2018  Glucose 65 - 99 mg/dL 458(P) 98 929(W)  BUN 8 - 27 mg/dL 24 22 44(Q)  Creatinine 0.76 - 1.27 mg/dL 2.86(N) 8.17(R) 1.16(F)  Sodium 134 - 144 mmol/L 140 137 137  Potassium 3.5 - 5.2 mmol/L 4.7 4.6 4.1  Chloride 96 - 106 mmol/L 103 106 102  CO2 20 - 29 mmol/L 24 26 27   Calcium 8.6 - 10.2 mg/dL 9.4 ) 9.2  Total Protein 6.0 - 8.5 g/dL 6.8 - 7.0  Total Bilirubin 0.0 - 1.2 mg/dL 0.5 - 0.7  Alkaline Phos 39 - 117 IU/L 36(L) - 31(L)  AST 0 - 40 IU/L 22 - 26  ALT 0 - 44 IU/L 12 - 21   CBC Latest Ref Rng & Units 06/26/2019 06/02/2018 05/31/2018  WBC 3.4 - 10.8 x10E3/uL 6.5 7.6 8.2  Hemoglobin 13.0 - 17.7 g/dL 06/02/2018 11.2(L) 12.2(L)  Hematocrit 37.5 - 51.0 % 38.0 34.4(L) 36.6(L)  Platelets 150 - 450 x10E3/uL 177 171 177   Lipid Panel     Component Value Date/Time   CHOL 121 12/19/2019 1328   TRIG 94 12/19/2019 1328   HDL 40 12/19/2019 1328   CHOLHDL 4.4 03/30/2017 0414   VLDL 42 (H) 03/30/2017 0414   LDLCALC 63 12/19/2019 1328   LABVLDL 18 12/19/2019 1328   External labs 09/10/2018:   Cholesterol, total 148.000 m 09/05/2019 HDL 43 MG/DL 09/07/2019 LDL 3/38/3291 mg 09/05/2019 Triglycerides 100.000 09/05/2019  A1C 4.900 % 09/05/2019. TSH 1.960 06/26/2019  Hemoglobin 12.000 g/d 09/05/2019 NR 1.040  03/29/2017 Platelets 177.000 06/26/2019  Creatinine, Serum 1.400 mg/ 09/05/2019 Potassium 4.700 06/26/2019 Magnesium N/D ALT (SGPT) 15.000 uni 09/05/2019  Cholesterol, total 167.000 m 09/10/2018 HDL 40 MG/DL 11/10/2018 LDL 6/0/0459 m 09/10/2018 Triglycerides 98.000 09/10/2018  A1C 5.200 % 09/10/2018 Hemoglobin 12.900 g/ 09/10/2018 Creatinine, Serum 1.400 mg/ 09/10/2018  TSH 1.730 03/23/2017  Medications and allergies   Allergies  Allergen Reactions  . Penicillins Other (See Comments)    DID THE REACTION INVOLVE: Swelling of the face/tongue/throat, SOB, or low BP? Unknown Sudden or severe rash/hives, skin peeling, or the inside of the mouth or nose? Unknown Did it require medical treatment? Unknown When did it last happen?70+ years ago If all above answers are "NO", may proceed with cephalosporin use.     Current Outpatient Medications  Medication Instructions  . ALPRAZolam (XANAX) 0.5 mg, Oral, At bedtime PRN  . aspirin EC 81 mg, Oral, Daily at bedtime  . atorvastatin (LIPITOR) 20 MG tablet TAKE 1 TABLET BY MOUTH EVERYDAY AT BEDTIME  . carvedilol (COREG) 25 mg, Oral, 2 times daily with meals  . clopidogrel (PLAVIX) 75 mg, Oral, Every morning  . DULoxetine (CYMBALTA) 60 mg, Oral, Daily at bedtime  . ezetimibe (ZETIA) 10 mg, Oral, Daily after supper  . isosorbide dinitrate (ISORDIL) 30 MG tablet Daily  . isosorbide mononitrate (IMDUR) 60 mg, Oral, 2 times daily  . Linzess 145 mcg, Oral, Daily PRN  . losartan (COZAAR) 25 mg, Oral, Daily  . Multiple Vitamins-Minerals (MULTIVITAMIN PO) 1 tablet, Oral, Daily at bedtime  . nitroGLYCERIN (NITROSTAT) 0.4 MG SL tablet PLACE 1 TABLET UNDER THE TONGUE EVERY 5 MINUTES AS NEEDED FOR CHEST PAIN  . ranolazine (RANEXA) 1000 MG SR tablet TAKE 1 TABLET BY MOUTH  TWICE A DAY    Radiology:   No results found.  Cardiac Studies:   Coronary angiogram 05/01/2018: Left main patent, diffuse disease of native RCA, LAD circumflex occluded. LIMA to LAD  patent, SVG to M2 patent with insertion site mismatch. Known occluded SVG to RCA and SVG to D1.  Peripheral angiogram 05/01/2018: Left SFA tandem 95% stenosis S/P directional atherectomy with ith HawkOne LS and InPact Admiral DCB 5.0 X 250 mm balloon. Left PT occluded. Right ostial SFA 70%, right mid to distal SFA and mid popliteal 95% stenosis, right PT occluded.  ABI 06/04/2018 : This exam reveals moderately decreased perfusion of the right lower extremity, noted at the post tibial artery level (ABI 0.68). This exam reveals mildly decreased perfusion of the left lower extremity, noted at the dorsalis pedis artery level (ABI 0.83).  Compared to 02/14/17, Left ABI has improved from 0.50 and suggests successful revascularization.  Carotid artery duplex  10/18/2019:  Stenosis in the right internal carotid artery (50-69%). Stenosis in the right external carotid artery (<50%).  Stenosis in the left external carotid artery (<50%).  Antegrade right vertebral artery flow. Antegrade left vertebral artery flow.  Follow up in six months is appropriate if clinically indicated. No significant change from 04/19/2019.  Carotid artery duplex 05/20/2020:  Stenosis in the right internal carotid artery (50-69%). Stenosis in the right external carotid artery (<50%).  Stenosis in the left external carotid artery (<50%). Left carotid endarterectomy site widely patent.  Antegrade right vertebral artery flow. Antegrade left vertebral artery flow.  Follow up in six months is appropriate if clinically indicated. No significant change from 10/18/2019.   EKG   EKG 06/03/2020: Sinus rhythm at a rate of 64 bpm. Left axis, left anterior fascicular block. Poor refreshing, cannot exclude anteroseptal infarct old. Nonspecific T abnormality, cannot exclude lateral ischemia.   EKG 12/02/2019: Normal sinus rhythm at rate of 69 bpm, left axis deviation, cannot exclude inferior infarct old.  No evidence of ischemia.  Compared  to 05/29/2019, frequent PVCs not present.  Lateral ST depression suggestive of LVH not present.  Assessment     ICD-10-CM   1. Coronary artery disease of bypass graft of native heart with stable angina pectoris (HCC)  I25.708 EKG 12-Lead  2. Primary hypertension  I10   3. Mixed hyperlipidemia  E78.2   4. Claudication in peripheral vascular disease (HCC)  I73.9   5. Stenosis of right carotid artery  I65.21      No orders of the defined types were placed in this encounter.   There are no discontinued medications.  Recommendations:   SHAWNTA ZIMBELMAN  is a 74 y.o. Caucasian male  with hypertension, mixed hyperlipidemia, CAD S/P CABG in 2006, mild to moderate LV systolic dysfunction, chronic angina pectoris and left carotid endarterectomy, mild right ICA stenosis and history of possible TIA in August 2018, found to have moderate intracranial disease and PAD with moderate decrease in bilateral ABI and diffuse PAD by angiogram, now on dual antiplatelet therapy.   Patient presents for 16-month follow-up of hypertension, hyperlipidemia, CAD status post CABG in 2006. Reviewed and discussed with patient results of carotid artery duplex, bilateral carotid artery disease has remained stable, will repeat surveillance in 6 months. Blood pressure was initially elevated in the office, upon recheck it is well controlled, therefore we will continue current antihypertensive medications. Lipids are now well controlled we will continue atorvastatin and Zetia.  In regard to chronic angina he remains stable. He he also reports only  mild symptoms of claudication occasionally, which is stable. Physical exam remained stable. I will not make changes to his cardiovascular medications at this time.  Follow-up in 6 months, sooner if needed. Patient prefers to follow-up with follow-up with Dr. Mercer Pod, PA-C 06/03/2020, 4:52 PM Office: [phone]

## 2020-06-09 ENCOUNTER — Other Ambulatory Visit: Payer: Self-pay | Admitting: Cardiology

## 2020-06-12 ENCOUNTER — Other Ambulatory Visit: Payer: Self-pay | Admitting: Cardiology

## 2020-06-12 DIAGNOSIS — I209 Angina pectoris, unspecified: Secondary | ICD-10-CM

## 2020-06-15 ENCOUNTER — Ambulatory Visit: Payer: Medicare PPO | Admitting: Cardiology

## 2020-06-16 ENCOUNTER — Encounter (HOSPITAL_COMMUNITY): Payer: Self-pay

## 2020-06-16 ENCOUNTER — Other Ambulatory Visit: Payer: Self-pay

## 2020-06-16 ENCOUNTER — Emergency Department (HOSPITAL_COMMUNITY): Payer: Medicare PPO

## 2020-06-16 ENCOUNTER — Inpatient Hospital Stay (HOSPITAL_COMMUNITY)
Admission: EM | Admit: 2020-06-16 | Discharge: 2020-07-10 | DRG: 377 | Disposition: E | Payer: Medicare PPO | Attending: Internal Medicine | Admitting: Internal Medicine

## 2020-06-16 DIAGNOSIS — K254 Chronic or unspecified gastric ulcer with hemorrhage: Principal | ICD-10-CM | POA: Diagnosis present

## 2020-06-16 DIAGNOSIS — R531 Weakness: Secondary | ICD-10-CM

## 2020-06-16 DIAGNOSIS — E1151 Type 2 diabetes mellitus with diabetic peripheral angiopathy without gangrene: Secondary | ICD-10-CM | POA: Diagnosis present

## 2020-06-16 DIAGNOSIS — Z8249 Family history of ischemic heart disease and other diseases of the circulatory system: Secondary | ICD-10-CM

## 2020-06-16 DIAGNOSIS — K219 Gastro-esophageal reflux disease without esophagitis: Secondary | ICD-10-CM | POA: Diagnosis present

## 2020-06-16 DIAGNOSIS — Z880 Allergy status to penicillin: Secondary | ICD-10-CM

## 2020-06-16 DIAGNOSIS — D649 Anemia, unspecified: Secondary | ICD-10-CM

## 2020-06-16 DIAGNOSIS — I25118 Atherosclerotic heart disease of native coronary artery with other forms of angina pectoris: Secondary | ICD-10-CM | POA: Diagnosis present

## 2020-06-16 DIAGNOSIS — N1832 Chronic kidney disease, stage 3b: Secondary | ICD-10-CM | POA: Diagnosis present

## 2020-06-16 DIAGNOSIS — I21A1 Myocardial infarction type 2: Secondary | ICD-10-CM | POA: Diagnosis present

## 2020-06-16 DIAGNOSIS — Z820 Family history of epilepsy and other diseases of the nervous system: Secondary | ICD-10-CM

## 2020-06-16 DIAGNOSIS — E1122 Type 2 diabetes mellitus with diabetic chronic kidney disease: Secondary | ICD-10-CM | POA: Diagnosis present

## 2020-06-16 DIAGNOSIS — G4733 Obstructive sleep apnea (adult) (pediatric): Secondary | ICD-10-CM | POA: Diagnosis present

## 2020-06-16 DIAGNOSIS — Z951 Presence of aortocoronary bypass graft: Secondary | ICD-10-CM

## 2020-06-16 DIAGNOSIS — I4891 Unspecified atrial fibrillation: Secondary | ICD-10-CM | POA: Diagnosis present

## 2020-06-16 DIAGNOSIS — W06XXXA Fall from bed, initial encounter: Secondary | ICD-10-CM | POA: Diagnosis present

## 2020-06-16 DIAGNOSIS — Z8673 Personal history of transient ischemic attack (TIA), and cerebral infarction without residual deficits: Secondary | ICD-10-CM

## 2020-06-16 DIAGNOSIS — J9622 Acute and chronic respiratory failure with hypercapnia: Secondary | ICD-10-CM | POA: Diagnosis present

## 2020-06-16 DIAGNOSIS — I6521 Occlusion and stenosis of right carotid artery: Secondary | ICD-10-CM | POA: Diagnosis present

## 2020-06-16 DIAGNOSIS — I130 Hypertensive heart and chronic kidney disease with heart failure and stage 1 through stage 4 chronic kidney disease, or unspecified chronic kidney disease: Secondary | ICD-10-CM | POA: Diagnosis present

## 2020-06-16 DIAGNOSIS — F418 Other specified anxiety disorders: Secondary | ICD-10-CM | POA: Diagnosis present

## 2020-06-16 DIAGNOSIS — S40021A Contusion of right upper arm, initial encounter: Secondary | ICD-10-CM | POA: Diagnosis present

## 2020-06-16 DIAGNOSIS — Z79899 Other long term (current) drug therapy: Secondary | ICD-10-CM

## 2020-06-16 DIAGNOSIS — Z9981 Dependence on supplemental oxygen: Secondary | ICD-10-CM

## 2020-06-16 DIAGNOSIS — R0600 Dyspnea, unspecified: Secondary | ICD-10-CM

## 2020-06-16 DIAGNOSIS — K259 Gastric ulcer, unspecified as acute or chronic, without hemorrhage or perforation: Secondary | ICD-10-CM | POA: Diagnosis present

## 2020-06-16 DIAGNOSIS — M170 Bilateral primary osteoarthritis of knee: Secondary | ICD-10-CM | POA: Diagnosis present

## 2020-06-16 DIAGNOSIS — J439 Emphysema, unspecified: Secondary | ICD-10-CM | POA: Diagnosis present

## 2020-06-16 DIAGNOSIS — Z20822 Contact with and (suspected) exposure to covid-19: Secondary | ICD-10-CM | POA: Diagnosis present

## 2020-06-16 DIAGNOSIS — J449 Chronic obstructive pulmonary disease, unspecified: Secondary | ICD-10-CM | POA: Diagnosis present

## 2020-06-16 DIAGNOSIS — Z800 Family history of malignant neoplasm of digestive organs: Secondary | ICD-10-CM

## 2020-06-16 DIAGNOSIS — Z87891 Personal history of nicotine dependence: Secondary | ICD-10-CM

## 2020-06-16 DIAGNOSIS — D62 Acute posthemorrhagic anemia: Secondary | ICD-10-CM | POA: Diagnosis present

## 2020-06-16 DIAGNOSIS — I5033 Acute on chronic diastolic (congestive) heart failure: Secondary | ICD-10-CM | POA: Diagnosis present

## 2020-06-16 DIAGNOSIS — F419 Anxiety disorder, unspecified: Secondary | ICD-10-CM | POA: Diagnosis present

## 2020-06-16 DIAGNOSIS — S0093XA Contusion of unspecified part of head, initial encounter: Secondary | ICD-10-CM | POA: Diagnosis present

## 2020-06-16 DIAGNOSIS — I25810 Atherosclerosis of coronary artery bypass graft(s) without angina pectoris: Secondary | ICD-10-CM | POA: Diagnosis present

## 2020-06-16 DIAGNOSIS — N183 Chronic kidney disease, stage 3 unspecified: Secondary | ICD-10-CM | POA: Diagnosis present

## 2020-06-16 DIAGNOSIS — Z66 Do not resuscitate: Secondary | ICD-10-CM | POA: Diagnosis not present

## 2020-06-16 DIAGNOSIS — Z87442 Personal history of urinary calculi: Secondary | ICD-10-CM

## 2020-06-16 DIAGNOSIS — R296 Repeated falls: Secondary | ICD-10-CM | POA: Diagnosis present

## 2020-06-16 DIAGNOSIS — Z9181 History of falling: Secondary | ICD-10-CM

## 2020-06-16 DIAGNOSIS — R0602 Shortness of breath: Secondary | ICD-10-CM

## 2020-06-16 DIAGNOSIS — G9341 Metabolic encephalopathy: Secondary | ICD-10-CM | POA: Diagnosis present

## 2020-06-16 DIAGNOSIS — I252 Old myocardial infarction: Secondary | ICD-10-CM

## 2020-06-16 DIAGNOSIS — K922 Gastrointestinal hemorrhage, unspecified: Secondary | ICD-10-CM

## 2020-06-16 DIAGNOSIS — Z7982 Long term (current) use of aspirin: Secondary | ICD-10-CM

## 2020-06-16 DIAGNOSIS — R54 Age-related physical debility: Secondary | ICD-10-CM | POA: Diagnosis present

## 2020-06-16 DIAGNOSIS — E669 Obesity, unspecified: Secondary | ICD-10-CM | POA: Diagnosis present

## 2020-06-16 DIAGNOSIS — N179 Acute kidney failure, unspecified: Secondary | ICD-10-CM

## 2020-06-16 DIAGNOSIS — E782 Mixed hyperlipidemia: Secondary | ICD-10-CM | POA: Diagnosis present

## 2020-06-16 DIAGNOSIS — K3189 Other diseases of stomach and duodenum: Secondary | ICD-10-CM | POA: Diagnosis present

## 2020-06-16 HISTORY — DX: Chronic kidney disease, unspecified: N18.9

## 2020-06-16 HISTORY — DX: Other specified anxiety disorders: F41.8

## 2020-06-16 LAB — APTT: aPTT: 33 seconds (ref 24–36)

## 2020-06-16 LAB — CBC
HCT: 24.6 % — ABNORMAL LOW (ref 39.0–52.0)
Hemoglobin: 7.6 g/dL — ABNORMAL LOW (ref 13.0–17.0)
MCH: 30.3 pg (ref 26.0–34.0)
MCHC: 30.9 g/dL (ref 30.0–36.0)
MCV: 98 fL (ref 80.0–100.0)
Platelets: 165 10*3/uL (ref 150–400)
RBC: 2.51 MIL/uL — ABNORMAL LOW (ref 4.22–5.81)
RDW: 15.2 % (ref 11.5–15.5)
WBC: 5.2 10*3/uL (ref 4.0–10.5)
nRBC: 0 % (ref 0.0–0.2)

## 2020-06-16 LAB — BASIC METABOLIC PANEL
Anion gap: 9 (ref 5–15)
BUN: 51 mg/dL — ABNORMAL HIGH (ref 8–23)
CO2: 23 mmol/L (ref 22–32)
Calcium: 8.4 mg/dL — ABNORMAL LOW (ref 8.9–10.3)
Chloride: 104 mmol/L (ref 98–111)
Creatinine, Ser: 1.9 mg/dL — ABNORMAL HIGH (ref 0.61–1.24)
GFR, Estimated: 36 mL/min — ABNORMAL LOW (ref >60)
Glucose, Bld: 96 mg/dL (ref 70–99)
Potassium: 4.7 mmol/L (ref 3.5–5.1)
Sodium: 136 mmol/L (ref 135–145)

## 2020-06-16 LAB — PROTIME-INR
INR: 1.2 (ref 0.8–1.2)
Prothrombin Time: 14.3 seconds (ref 11.4–15.2)

## 2020-06-16 LAB — COMPREHENSIVE METABOLIC PANEL
ALT: 12 U/L (ref 0–44)
AST: 18 U/L (ref 15–41)
Albumin: 3.8 g/dL (ref 3.5–5.0)
Alkaline Phosphatase: 38 U/L (ref 38–126)
Anion gap: 10 (ref 5–15)
BUN: 55 mg/dL — ABNORMAL HIGH (ref 8–23)
CO2: 24 mmol/L (ref 22–32)
Calcium: 9.1 mg/dL (ref 8.9–10.3)
Chloride: 100 mmol/L (ref 98–111)
Creatinine, Ser: 2.22 mg/dL — ABNORMAL HIGH (ref 0.61–1.24)
GFR, Estimated: 30 mL/min — ABNORMAL LOW (ref >60)
Glucose, Bld: 107 mg/dL — ABNORMAL HIGH (ref 70–99)
Potassium: 5.5 mmol/L — ABNORMAL HIGH (ref 3.5–5.1)
Sodium: 134 mmol/L — ABNORMAL LOW (ref 135–145)
Total Bilirubin: 0.9 mg/dL (ref 0.3–1.2)
Total Protein: 7.3 g/dL (ref 6.5–8.1)

## 2020-06-16 LAB — URINALYSIS, ROUTINE W REFLEX MICROSCOPIC
Bilirubin Urine: NEGATIVE
Glucose, UA: NEGATIVE mg/dL
Hgb urine dipstick: NEGATIVE
Ketones, ur: 5 mg/dL — AB
Leukocytes,Ua: NEGATIVE
Nitrite: NEGATIVE
Protein, ur: 30 mg/dL — AB
Specific Gravity, Urine: 1.02 (ref 1.005–1.030)
pH: 5 (ref 5.0–8.0)

## 2020-06-16 LAB — CBC WITH DIFFERENTIAL/PLATELET
Abs Immature Granulocytes: 0.02 10*3/uL (ref 0.00–0.07)
Basophils Absolute: 0 10*3/uL (ref 0.0–0.1)
Basophils Relative: 0 %
Eosinophils Absolute: 0 10*3/uL (ref 0.0–0.5)
Eosinophils Relative: 1 %
HCT: 23.2 % — ABNORMAL LOW (ref 39.0–52.0)
Hemoglobin: 7.2 g/dL — ABNORMAL LOW (ref 13.0–17.0)
Immature Granulocytes: 0 %
Lymphocytes Relative: 22 %
Lymphs Abs: 1.4 10*3/uL (ref 0.7–4.0)
MCH: 30.4 pg (ref 26.0–34.0)
MCHC: 31 g/dL (ref 30.0–36.0)
MCV: 97.9 fL (ref 80.0–100.0)
Monocytes Absolute: 0.6 10*3/uL (ref 0.1–1.0)
Monocytes Relative: 10 %
Neutro Abs: 4.4 10*3/uL (ref 1.7–7.7)
Neutrophils Relative %: 67 %
Platelets: 207 10*3/uL (ref 150–400)
RBC: 2.37 MIL/uL — ABNORMAL LOW (ref 4.22–5.81)
RDW: 15.3 % (ref 11.5–15.5)
WBC: 6.5 10*3/uL (ref 4.0–10.5)
nRBC: 0 % (ref 0.0–0.2)

## 2020-06-16 LAB — PREPARE RBC (CROSSMATCH)

## 2020-06-16 LAB — POC OCCULT BLOOD, ED: Fecal Occult Bld: POSITIVE — AB

## 2020-06-16 LAB — POTASSIUM: Potassium: 5.5 mmol/L — ABNORMAL HIGH (ref 3.5–5.1)

## 2020-06-16 LAB — SARS CORONAVIRUS 2 (TAT 6-24 HRS): SARS Coronavirus 2: NEGATIVE

## 2020-06-16 LAB — ABO/RH: ABO/RH(D): A POS

## 2020-06-16 LAB — HEMOGLOBIN: Hemoglobin: 8.2 g/dL — ABNORMAL LOW (ref 13.0–17.0)

## 2020-06-16 MED ORDER — SODIUM CHLORIDE 0.9 % IV SOLN
10.0000 mL/h | Freq: Once | INTRAVENOUS | Status: AC
  Administered 2020-06-16: 10 mL/h via INTRAVENOUS

## 2020-06-16 MED ORDER — SODIUM CHLORIDE 0.9 % IV BOLUS
1000.0000 mL | Freq: Once | INTRAVENOUS | Status: AC
  Administered 2020-06-16: 1000 mL via INTRAVENOUS

## 2020-06-16 MED ORDER — EZETIMIBE 10 MG PO TABS
10.0000 mg | ORAL_TABLET | Freq: Every day | ORAL | Status: DC
  Administered 2020-06-18 – 2020-06-20 (×3): 10 mg via ORAL
  Filled 2020-06-16 (×5): qty 1

## 2020-06-16 MED ORDER — ATORVASTATIN CALCIUM 10 MG PO TABS
20.0000 mg | ORAL_TABLET | Freq: Every day | ORAL | Status: DC
  Administered 2020-06-18 – 2020-06-20 (×3): 20 mg via ORAL
  Filled 2020-06-16 (×3): qty 2

## 2020-06-16 MED ORDER — ONDANSETRON HCL 4 MG/2ML IJ SOLN: 4.0000 mg | Freq: Four times a day (QID) | INTRAMUSCULAR | Status: DC | PRN

## 2020-06-16 MED ORDER — ONDANSETRON HCL 4 MG PO TABS: 4.0000 mg | ORAL_TABLET | Freq: Four times a day (QID) | ORAL | Status: DC | PRN

## 2020-06-16 MED ORDER — SODIUM CHLORIDE 0.9 % IV SOLN: INTRAVENOUS | Status: DC

## 2020-06-16 MED ORDER — DULOXETINE HCL 30 MG PO CPEP
60.0000 mg | ORAL_CAPSULE | Freq: Every day | ORAL | Status: DC
  Administered 2020-06-16 – 2020-06-20 (×5): 60 mg via ORAL
  Filled 2020-06-16 (×5): qty 2

## 2020-06-16 MED ORDER — PANTOPRAZOLE SODIUM 40 MG IV SOLR
40.0000 mg | Freq: Two times a day (BID) | INTRAVENOUS | Status: DC
  Administered 2020-06-19 – 2020-06-20 (×4): 40 mg via INTRAVENOUS
  Filled 2020-06-16 (×4): qty 40

## 2020-06-16 MED ORDER — SODIUM CHLORIDE 0.9 % IV SOLN
8.0000 mg/h | INTRAVENOUS | Status: AC
  Administered 2020-06-16 – 2020-06-18 (×5): 8 mg/h via INTRAVENOUS
  Filled 2020-06-16 (×8): qty 80

## 2020-06-16 MED ORDER — SODIUM CHLORIDE 0.9 % IV SOLN
80.0000 mg | Freq: Once | INTRAVENOUS | Status: AC
  Administered 2020-06-16: 02:00:00 80 mg via INTRAVENOUS
  Filled 2020-06-16: qty 80

## 2020-06-16 MED ORDER — ALPRAZOLAM 0.5 MG PO TABS
0.5000 mg | ORAL_TABLET | Freq: Every evening | ORAL | Status: DC | PRN
  Administered 2020-06-16 (×2): 0.5 mg via ORAL
  Filled 2020-06-16 (×2): qty 1

## 2020-06-16 NOTE — ED Notes (Signed)
Pt switched to inpatient bed for comfort. Pt requesting to use urinal instead of primofit.

## 2020-06-16 NOTE — Progress Notes (Signed)
Pt received to inpatient room 1502. Pt Alert & oriented to self, place & knows the year & month but not day.  When pt arrived to the unit I introduced myself & oriented him to room etc. Then spoke to him about the blood transfusion that was ordered for him- pt strongly refused. Tried to educate the pt on the risks of this.  Blood returned to blood bank.  Pt family friend came to visit & now he is agreeable. Will follow up on getting another unit of blood processed.

## 2020-06-16 NOTE — ED Provider Notes (Signed)
[facility]-EMERGENCY DEPT Provider Note   CSN: [id number] Arrival date & time: 06/11/2020  0013     History Chief Complaint  Patient presents with  . Fall  . Abnormal Lab    [name] is a 75 y.o. male.   75 year old male with a history of CAD (on aspirin and Plavix), hypertension, dyslipidemia, MI, DM, dementia presents to the emergency department following multiple falls.  Patient states that he has been feeling generally weak over the past few days.  He lives in a house with his wife with a friend/caregiver that comes and checks on him frequently.  Larey Seat out of bed about 24 hours ago striking the right side of his head on the nightstand.  He had no loss of consciousness.  Was seen by his PCP the afternoon of 06/15/20 regarding his fall and persistent weakness where labs were drawn and his hemoglobin was found to be 7.7.  He was told to discontinue his Plavix and was scheduled for a blood transfusion today (06/20/2020). Tonight was attempting to ambulate when he "slid" onto the floor after feeling profoundly weak. Did not hit his head tonight.  States that his stool has potentially been dark in color, but unable to fully endorse melena.  No hematochezia.  Denies headache, chest pain, shortness of breath, abdominal pain, extremity numbness/paresthesias.         Past Medical History:  Diagnosis Date  . Anxiety   . CAD (coronary artery disease) of artery bypass graft 12/09/2013  . Chronic lower back pain   . Coronary artery disease   . Dementia (HCC)    "step dementia; from mini strokes in 2018" (11/08/2017)  . Depression   . Diabetes mellitus    "totally diet controlled" (11/08/2017)  . Hearing loss    wears bilateral hearing aids  . High cholesterol   . History of kidney stones   . Hypertension   . MI (myocardial infarction) (HCC) [phone]   (several)  . On home oxygen therapy    "2L when he sleeps at night" (11/08/2017)  . Osteoarthritis of both knees     "both knees need replaced" (11/08/2017)  . Sleep apnea    "mild; uses oxygen" (11/08/2017)  . Stroke [facility]) 2018   "several silent ones; a little memory loss; step dementia" (11/08/2017)    Patient Active Problem List   Diagnosis Date Noted  . UGI bleed 07/01/2020  . Acute appendicitis 05/31/2018  . Unilateral primary osteoarthritis, left knee 05/22/2018  . Unilateral primary osteoarthritis, right knee 05/22/2018  . Claudication in peripheral vascular disease (HCC) 04/30/2018  . IBS (irritable bowel syndrome) 11/12/2017  . Appendicitis, acute 11/08/2017  . Supplemental oxygen dependent 11/07/2017  . MCI (mild cognitive impairment) with memory loss 11/07/2017  . Sleep-related hypoxia 06/26/2017  . Chronic diastolic CHF (congestive heart failure) (HCC) 06/26/2017  . Stroke-like symptoms 03/30/2017  . CKD stage 3 due to type 2 diabetes mellitus (HCC)   . Benign essential HTN   . TIA (transient ischemic attack) 03/29/2017  . 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 History of completed stroke 10/24/2016  . Coronary artery disease involving coronary bypass graft with unspecified angina pectoris 05/21/2014  . Sleep related hypoventilation/hypoxemia in other disease 01/15/2014  . Primary snoring 12/09/2013  . Hypersomnia with sleep apnea 12/09/2013  . CAD (coronary artery disease) of artery bypass graft 12/09/2013    Past Surgical History:  Procedure Laterality Date  . 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 CARDIAC CATHETERIZATION     "many"  . CAROTID ENDARTERECTOMY Left   . CORONARY ARTERY BYPASS GRAFT  1996   5 vessels  . CORONARY/GRAFT ANGIOGRAPHY N/A 05/01/2018   Procedure: CORONARY/GRAFT ANGIOGRAPHY;  Surgeon: Yates Decamp, MD;  Location: [facility] INVASIVE CV LAB;  Service: Cardiovascular;  Laterality: N/A;  . FRACTURE SURGERY    . KIDNEY STONE SURGERY  ~ 2014   "laparoscopic, robotic OR"  .  LAPAROSCOPIC APPENDECTOMY N/A 06/03/2018   Procedure: APPENDECTOMY LAPAROSCOPIC;  Surgeon: Griselda Miner, MD;  Location: [facility] ORS;  Service: General;  Laterality: N/A;  . LOWER EXTREMITY ANGIOGRAPHY Bilateral 05/01/2018   Procedure: LOWER EXTREMITY ANGIOGRAPHY;  Surgeon: Yates Decamp, MD;  Location: [facility] INVASIVE CV LAB;  Service: Cardiovascular;  Laterality: Bilateral;  . PERIPHERAL VASCULAR ATHERECTOMY Left 05/01/2018   Procedure: PERIPHERAL VASCULAR ATHERECTOMY;  Surgeon: Yates Decamp, MD;  Location: [facility] INVASIVE CV LAB;  Service: Cardiovascular;  Laterality: Left;  SFA  . PERIPHERAL VASCULAR BALLOON ANGIOPLASTY  05/01/2018   Procedure: PERIPHERAL VASCULAR BALLOON ANGIOPLASTY;  Surgeon: Yates Decamp, MD;  Location: [facility] INVASIVE CV LAB;  Service: Cardiovascular;;  . WRIST FRACTURE SURGERY Left 2001  . WRIST SURGERY         Family History  Problem Relation Age of Onset  . Heart disease Mother   . Heart disease Father   . Liver cancer Other   . Parkinson's disease Other   . Heart disease Brother   . Parkinsonism Sister   . Cancer Sister     Social History   Tobacco Use  . Smoking status: Former Smoker    Packs/day: 2.00    Years: 20.00    Pack years: 40.00    Types: Cigarettes    Quit date: 10/20/1983    Years since quitting: 36.6  . Smokeless tobacco: Never Used  Vaping Use  . Vaping Use: Never used  Substance Use Topics  . Alcohol use: Not Currently  . Drug use: Yes    Types: Marijuana    Comment: 11/08/2017 "couple times/month"    Home Medications Prior to Admission medications   Medication Sig Start Date End Date Taking? Authorizing Provider  ALPRAZolam Prudy Feeler) 0.5 MG tablet Take 1 tablet (0.5 mg total) at bedtime as needed by mouth. Patient taking differently: Take 0.5 mg by mouth See admin instructions. Take 0.5 mg at bedtime, may take an additional 0.5 mg dose as needed for anxiety 02/24/17  Yes Dohmeier, Porfirio Mylar, MD  aspirin EC 81 MG tablet Take 81 mg by mouth at bedtime.    Yes [provider]  atorvastatin (LIPITOR) 20 MG tablet TAKE 1 TABLET BY MOUTH EVERYDAY AT BEDTIME Patient taking differently: Take 20 mg by mouth daily. 04/08/20  Yes Yates Decamp, MD  carvedilol (COREG) 25 MG tablet Take 25 mg by mouth 2 (two) times daily with a meal.   Yes [provider]  DULoxetine (CYMBALTA) 60 MG capsule Take 60 mg by mouth at bedtime.   Yes [provider]  ezetimibe (ZETIA) 10 MG tablet Take 1 tablet (10 mg total) by mouth daily after supper. 12/02/19 11/26/20 Yes Yates Decamp, MD  isosorbide mononitrate (IMDUR) 30 MG 24 hr tablet TAKE 2 TABLETS (60 MG TOTAL) BY MOUTH 2 (TWO) TIMES DAILY. 05/14/20  Yes Yates Decamp, MD  LINZESS 145 MCG CAPS capsule Take 145 mcg by mouth daily as needed (constipation).  01/09/17  Yes [provider]  losartan (COZAAR) 25 MG tablet Take 25 mg by mouth daily.   Yes [provider]  nitroGLYCERIN (NITROSTAT) 0.4 MG SL tablet PLACE 1 TABLET UNDER THE TONGUE EVERY 5 MINUTES AS NEEDED FOR CHEST PAIN. Patient taking differently: Place 0.4 mg under the tongue every 5 (five) minutes as needed for chest pain. 06/12/20  Yes Yates Decamp, MD  pantoprazole (PROTONIX) 40 MG tablet Take 40 mg by mouth 2 (two) times daily.   Yes [provider]  ranolazine (RANEXA) 1000 MG SR tablet TAKE 1 TABLET BY MOUTH TWICE A DAY Patient taking differently: Take 1,000 mg by mouth 2 (two) times daily. 06/10/20  Yes Yates Decamp, MD    Allergies    Penicillin g and Penicillins  Review of Systems   Review of Systems  Ten systems reviewed and are negative for acute change, except as noted in the HPI.    Physical Exam Updated Vital Signs BP (!) 107/47 (BP Location: Left Arm) Comment: Nurse is aware.   Pulse 60   Temp 97.6 F (36.4 C) (Axillary)   Resp 15   SpO2 100%   Physical Exam Vitals and nursing note reviewed.  Constitutional:      General: He is not in acute distress.    Appearance: He is well-developed and  well-nourished. He is not diaphoretic.     Comments: Alert and nontoxic appearing  HENT:     Head: Normocephalic and atraumatic.     Right Ear: External ear normal.     Left Ear: External ear normal.  Eyes:     General: No scleral icterus.    Extraocular Movements: Extraocular movements intact and EOM normal.     Conjunctiva/sclera: Conjunctivae normal.  Cardiovascular:     Rate and Rhythm: Normal rate and regular rhythm.     Pulses: Normal pulses.  Pulmonary:     Effort: Pulmonary effort is normal. No respiratory distress.     Breath sounds: No stridor. No wheezing.     Comments: Respirations even and unlabored Abdominal:     Palpations: Abdomen is soft. There is no mass.     Tenderness: There is no abdominal tenderness. There is no guarding.  Genitourinary:    Comments: Exam chaperoned by Nettie Elm, EMT. Normal rectal tone. Stool c/w melena. No gross blood. Musculoskeletal:        General: Normal range of motion.     Cervical back: Normal range of motion.  Skin:    General: Skin is warm and dry.     Coloration: Skin is not pale.     Findings: No erythema or rash.     Comments: Hematomas to b/l forearms. Mild desquamation over left knee.  Neurological:     Mental Status: He is alert and oriented to person, place, and time.     Comments: GCS 15. Speech is goal oriented. No cranial nerve deficits appreciated; symmetric eyebrow raise, no facial drooping, tongue midline. Patient has equal grip strength bilaterally with equal strength throughout. Sensation to light touch intact. Patient moves extremities without ataxia. No pronator drift. Ambulation not assessed.  Psychiatric:        Mood and Affect: Mood and affect normal.        Behavior: Behavior normal.     ED Results / Procedures / Treatments   Labs (all labs ordered are listed, but only abnormal results are displayed) Labs Reviewed  CBC WITH DIFFERENTIAL/PLATELET - Abnormal; Notable for the following components:      Result  Value  RBC 2.37 (*)    Hemoglobin 7.2 (*)    HCT 23.2 (*)    All other components within normal limits  COMPREHENSIVE METABOLIC PANEL - Abnormal; Notable for the following components:   Sodium 134 (*)    Potassium 5.5 (*)    Glucose, Bld 107 (*)    BUN 55 (*)    Creatinine, Ser 2.22 (*)    GFR, Estimated 30 (*)    All other components within normal limits  POC OCCULT BLOOD, ED - Abnormal; Notable for the following components:   Fecal Occult Bld POSITIVE (*)    All other components within normal limits  SARS CORONAVIRUS 2 (TAT 6-24 HRS)  PROTIME-INR  APTT  OCCULT BLOOD X 1 CARD TO LAB, STOOL  URINALYSIS, ROUTINE W REFLEX MICROSCOPIC  POTASSIUM  CBC  BASIC METABOLIC PANEL  HEMOGLOBIN  HEMOGLOBIN  TYPE AND SCREEN  ABO/RH  PREPARE RBC (CROSSMATCH)    EKG None  Radiology CT Head Wo Contrast  Result Date: 06/19/2020 CLINICAL DATA:  History of multiple falls today EXAM: CT HEAD WITHOUT CONTRAST TECHNIQUE: Contiguous axial images were obtained from the base of the skull through the vertex without intravenous contrast. COMPARISON:  03/29/2017 FINDINGS: Brain: No evidence of acute infarction, hemorrhage, hydrocephalus, extra-axial collection or mass lesion/mass effect. Chronic atrophic and ischemic changes are noted. Vascular: No hyperdense vessel or unexpected calcification. Skull: Normal. Negative for fracture or focal lesion. Sinuses/Orbits: No acute finding. Other: None. IMPRESSION: Chronic atrophic and ischemic changes without acute abnormality. Electronically Signed   By: Alcide Clever [name]   On: 06/11/2020 01:44    Procedures .Critical Care Performed by: Antony Madura, PA-C Authorized by: Antony Madura, PA-C   Critical care provider statement:    Critical care time (minutes):  45   Critical care was necessary to treat or prevent imminent or life-threatening deterioration of the following conditions: UGI bleed; anemia.   Critical care was time spent personally by me on the  following activities:  Discussions with consultants, evaluation of patient's response to treatment, examination of patient, ordering and performing treatments and interventions, ordering and review of laboratory studies, ordering and review of radiographic studies, pulse oximetry, re-evaluation of patient's condition, obtaining history from patient or surrogate and review of old charts     Medications Ordered in ED Medications  pantoprazole (PROTONIX) 80 mg in sodium chloride 0.9 % 100 mL IVPB (80 mg Intravenous New Bag/Given 07/03/2020 0215)  pantoprazole (PROTONIX) 80 mg in sodium chloride 0.9 % 100 mL (0.8 mg/mL) infusion (has no administration in time range)  pantoprazole (PROTONIX) injection 40 mg (has no administration in time range)  ondansetron (ZOFRAN) tablet 4 mg (has no administration in time range)    Or  ondansetron (ZOFRAN) injection 4 mg (has no administration in time range)  sodium chloride 0.9 % bolus 1,000 mL (1,000 mLs Intravenous New Bag/Given 07/02/2020 0211)  0.9 %  sodium chloride infusion (10 mL/hr Intravenous New Bag/Given 06/14/2020 0212)    ED Course  I have reviewed the triage vital signs and the nursing notes.  Pertinent labs & imaging results that were available during my care of the patient were reviewed by me and considered in my medical decision making (see chart for details).  Clinical Course as of 06/29/2020 0224  Tue Jun 16, 2020  0108 Orders placed for Protonix given positive hemoccult with concern for UGI bleed. [KH]  0207 Secure message sent to Dr. Orvan Falconer, on call for  GI, regarding need for consultation in AM. [  KH]    Clinical Course User Index [KH] Antony MaduraHumes, Kelly, PA-C   MDM Rules/Calculators/A&P                          75 year old male presenting for generalized weakness, frequent falls. Work up notable for anemia, suspected secondary to upper GI bleed given positive hemoccult. IV PRBCs and Protonix initiated.  He was taken off Plavix today by his  primary care doctor.  Is not on any other chronic anticoagulant.  Also ordered for continued hydration given AKI.  He will be admitted to the hospitalist service for ongoing management; anticipated GI consultation later this morning.   Final Clinical Impression(s) / ED Diagnoses Final diagnoses:  Symptomatic anemia  AKI (acute kidney injury) (HCC)  Generalized weakness  UGI bleed    Rx / DC Orders ED Discharge Orders    None       Antony MaduraHumes, Kelly, PA-C 06/22/2020 0224    Dione BoozeGlick, David, MD 06/27/2020 [phone]

## 2020-06-16 NOTE — H&P (Signed)
History and Physical    JAEVEN Brooks ESP:[id number] DOB: [birth year] DOA: 06/28/2020  PCP: [name], MD  Patient coming from: Home  Chief Complaint: "I need blood but I'm not suitable for it."  HPI: [name] is a 75 y.o. male with medical history significant of HTN, HLD, CAD. Presenting with weakness and falls. Limited history is per friend as the patient is somewhat confused and is a poor historian. The friend reports that the patient has been generally weak and had falls for the last 4 days. He has hit his head at least once, but has no LOC or syncopal episodes. He became concerned and brought him to his PCP. They found his Hgb to be 7, which apparently was a significant drop from his previous reading. It was recommended that he come to the ED for assistance.   ED Course: His Hgb was 7.2. He was transfused 2 units pRBCs. TRH was called for admission.   Review of Systems:  Denies CP, palpitations, dyspnea, N/V/D/F. Review of systems is otherwise negative for all not mentioned in HPI.   PMHx Past Medical History:  Diagnosis Date  . Anxiety   . CAD (coronary artery disease) of artery bypass graft 12/09/2013  . Chronic lower back pain   . Coronary artery disease   . Dementia (HCC)    "step dementia; from mini strokes in 2018" (11/08/2017)  . Depression   . Diabetes mellitus    "totally diet controlled" (11/08/2017)  . Hearing loss    wears bilateral hearing aids  . High cholesterol   . History of kidney stones   . Hypertension   . MI (myocardial infarction) (HCC) [phone]   (several)  . On home oxygen therapy    "2L when he sleeps at night" (11/08/2017)  . Osteoarthritis of both knees    "both knees need replaced" (11/08/2017)  . Sleep apnea    "mild; uses oxygen" (11/08/2017)  . Stroke [facility]) 2018   "several silent ones; a little memory loss; step dementia" (11/08/2017)    PSHx Past Surgical History:  Procedure Laterality Date  . AV FISTULA REPAIR  2005   "developed  av fistula after cath; had to have it repaired" (11/08/2017)  . CARDIAC CATHETERIZATION     "many"  . CAROTID ENDARTERECTOMY Left   . CORONARY ARTERY BYPASS GRAFT  1996   5 vessels  . CORONARY/GRAFT ANGIOGRAPHY N/A 05/01/2018   Procedure: CORONARY/GRAFT ANGIOGRAPHY;  Surgeon: Yates Decamp, MD;  Location: [facility] INVASIVE CV LAB;  Service: Cardiovascular;  Laterality: N/A;  . FRACTURE SURGERY    . KIDNEY STONE SURGERY  ~ 2014   "laparoscopic, robotic OR"  . LAPAROSCOPIC APPENDECTOMY N/A 06/03/2018   Procedure: APPENDECTOMY LAPAROSCOPIC;  Surgeon: Griselda Miner, MD;  Location: [facility] ORS;  Service: General;  Laterality: N/A;  . LOWER EXTREMITY ANGIOGRAPHY Bilateral 05/01/2018   Procedure: LOWER EXTREMITY ANGIOGRAPHY;  Surgeon: Yates Decamp, MD;  Location: [facility] INVASIVE CV LAB;  Service: Cardiovascular;  Laterality: Bilateral;  . PERIPHERAL VASCULAR ATHERECTOMY Left 05/01/2018   Procedure: PERIPHERAL VASCULAR ATHERECTOMY;  Surgeon: Yates Decamp, MD;  Location: [facility] INVASIVE CV LAB;  Service: Cardiovascular;  Laterality: Left;  SFA  . PERIPHERAL VASCULAR BALLOON ANGIOPLASTY  05/01/2018   Procedure: PERIPHERAL VASCULAR BALLOON ANGIOPLASTY;  Surgeon: Yates Decamp, MD;  Location: [facility] INVASIVE CV LAB;  Service: Cardiovascular;;  . WRIST FRACTURE SURGERY Left 2001  . WRIST SURGERY      SocHx  reports that he quit smoking about 36 years ago.  His smoking use included cigarettes. He has a 40.00 pack-year smoking history. He has never used smokeless tobacco. He reports previous alcohol use. He reports current drug use. Drug: Marijuana.  Allergies  Allergen Reactions  . Penicillin G Other (See Comments)  . Penicillins Other (See Comments)    DID THE REACTION INVOLVE: Swelling of the face/tongue/throat, SOB, or low BP? Unknown Sudden or severe rash/hives, skin peeling, or the inside of the mouth or nose? Unknown Did it require medical treatment? Unknown When did it last happen?70+ years ago If all above answers are  "NO", may proceed with cephalosporin use.     FamHx Family History  Problem Relation Age of Onset  . Heart disease Mother   . Heart disease Father   . Liver cancer Other   . Parkinson's disease Other   . Heart disease Brother   . Parkinsonism Sister   . Cancer Sister     Prior to Admission medications   Medication Sig Start Date End Date Taking? Authorizing Provider  ALPRAZolam Prudy Feeler) 0.5 MG tablet Take 1 tablet (0.5 mg total) at bedtime as needed by mouth. Patient taking differently: Take 0.5 mg by mouth See admin instructions. Take 0.5 mg at bedtime, may take an additional 0.5 mg dose as needed for anxiety 02/24/17  Yes Dohmeier, Porfirio Mylar, MD  aspirin EC 81 MG tablet Take 81 mg by mouth at bedtime.   Yes [provider]  atorvastatin (LIPITOR) 20 MG tablet TAKE 1 TABLET BY MOUTH EVERYDAY AT BEDTIME Patient taking differently: Take 20 mg by mouth daily. 04/08/20  Yes Yates Decamp, MD  carvedilol (COREG) 25 MG tablet Take 25 mg by mouth 2 (two) times daily with a meal.   Yes [provider]  DULoxetine (CYMBALTA) 60 MG capsule Take 60 mg by mouth at bedtime.   Yes [provider]  ezetimibe (ZETIA) 10 MG tablet Take 1 tablet (10 mg total) by mouth daily after supper. 12/02/19 11/26/20 Yes Yates Decamp, MD  isosorbide mononitrate (IMDUR) 30 MG 24 hr tablet TAKE 2 TABLETS (60 MG TOTAL) BY MOUTH 2 (TWO) TIMES DAILY. 05/14/20  Yes Yates Decamp, MD  LINZESS 145 MCG CAPS capsule Take 145 mcg by mouth daily as needed (constipation).  01/09/17  Yes [provider]  losartan (COZAAR) 25 MG tablet Take 25 mg by mouth daily.   Yes [provider]  nitroGLYCERIN (NITROSTAT) 0.4 MG SL tablet PLACE 1 TABLET UNDER THE TONGUE EVERY 5 MINUTES AS NEEDED FOR CHEST PAIN. Patient taking differently: Place 0.4 mg under the tongue every 5 (five) minutes as needed for chest pain. 06/12/20  Yes Yates Decamp, MD  pantoprazole (PROTONIX) 40 MG tablet Take 40 mg by mouth 2 (two) times  daily.   Yes [provider]  ranolazine (RANEXA) 1000 MG SR tablet TAKE 1 TABLET BY MOUTH TWICE A DAY Patient taking differently: Take 1,000 mg by mouth 2 (two) times daily. 06/10/20  Yes Yates Decamp, MD    Physical Exam: Vitals:   06/20/2020 0530 Jun 20, 2020 0600 Jun 20, 2020 0615 06-20-20 0710  BP: 111/78 125/76 (!) 127/97 (!) 150/115  Pulse: 65 67 75 66  Resp: 16 17 16 18   Temp:    (!) 97.3 F (36.3 C)  TempSrc:      SpO2: 96% 94% 96% 95%    General: 75 y.o. male resting in bed in NAD Eyes: PERRL, normal sclera ENMT: Nares patent w/o discharge, orophaynx clear, dentition normal, ears w/o discharge/lesions/ulcers Neck: Supple, trachea midline Cardiovascular: RRR, +S1,  S2, 4/6 SEM, no g/r, equal pulses throughout Respiratory: CTABL, no w/r/r, normal WOB GI: BS+, NDNT, no masses noted, no organomegaly noted MSK: No e/c/c Skin: No rashes,  ulcerations noted; right arm bruising, right head bruising Neuro: A&O x 3 but still confused, no focal deficits Psyc: somewhat agitated but cooperative  Labs on Admission: I have personally reviewed following labs and imaging studies  CBC: Recent Labs  Lab 07/05/2020 0030 07/07/2020 0550  WBC 6.5 5.2  NEUTROABS 4.4  --   HGB 7.2* 7.6*  HCT 23.2* 24.6*  MCV 97.9 98.0  PLT 207 165   Basic Metabolic Panel: Recent Labs  Lab 07/01/2020 0030 06/13/2020 0139 06/09/2020 0550  NA 134*  --  136  K 5.5* 5.5* 4.7  CL 100  --  104  CO2 24  --  23  GLUCOSE 107*  --  96  BUN 55*  --  51*  CREATININE 2.22*  --  1.90*  CALCIUM 9.1  --  8.4*   GFR: Estimated Creatinine Clearance: 33.1 mL/min (A) (by C-G formula based on SCr of 1.9 mg/dL (H)). Liver Function Tests: Recent Labs  Lab 06/15/2020 0030  AST 18  ALT 12  ALKPHOS 38  BILITOT 0.9  PROT 7.3  ALBUMIN 3.8   No results for input(s): LIPASE, AMYLASE in the last 168 hours. No results for input(s): AMMONIA in the last 168 hours. Coagulation Profile: Recent Labs  Lab 06/26/2020 0027  INR  1.2   Cardiac Enzymes: No results for input(s): CKTOTAL, CKMB, CKMBINDEX, TROPONINI in the last 168 hours. BNP (last 3 results) No results for input(s): PROBNP in the last 8760 hours. HbA1C: No results for input(s): HGBA1C in the last 72 hours. CBG: No results for input(s): GLUCAP in the last 168 hours. Lipid Profile: No results for input(s): CHOL, HDL, LDLCALC, TRIG, CHOLHDL, LDLDIRECT in the last 72 hours. Thyroid Function Tests: No results for input(s): TSH, T4TOTAL, FREET4, T3FREE, THYROIDAB in the last 72 hours. Anemia Panel: No results for input(s): VITAMINB12, FOLATE, FERRITIN, TIBC, IRON, RETICCTPCT in the last 72 hours. Urine analysis:    Component Value Date/Time   COLORURINE AMBER (A) 06/13/2020 0550   APPEARANCEUR HAZY (A) 06/15/2020 0550   LABSPEC 1.020 06/30/2020 0550   PHURINE 5.0 06/29/2020 0550   GLUCOSEU NEGATIVE 06/27/2020 0550   HGBUR NEGATIVE 07/08/2020 0550   BILIRUBINUR NEGATIVE 07/02/2020 0550   KETONESUR 5 (A) 06/20/2020 0550   PROTEINUR 30 (A) 06/23/2020 0550   NITRITE NEGATIVE 07/05/2020 0550   LEUKOCYTESUR NEGATIVE 06/18/2020 0550    Radiological Exams on Admission: CT Head Wo Contrast  Result Date: 06/15/2020 CLINICAL DATA:  History of multiple falls today EXAM: CT HEAD WITHOUT CONTRAST TECHNIQUE: Contiguous axial images were obtained from the base of the skull through the vertex without intravenous contrast. COMPARISON:  03/29/2017 FINDINGS: Brain: No evidence of acute infarction, hemorrhage, hydrocephalus, extra-axial collection or mass lesion/mass effect. Chronic atrophic and ischemic changes are noted. Vascular: No hyperdense vessel or unexpected calcification. Skull: Normal. Negative for fracture or focal lesion. Sinuses/Orbits: No acute finding. Other: None. IMPRESSION: Chronic atrophic and ischemic changes without acute abnormality. Electronically Signed   By: Alcide CleverMark  Lukens [name]   On: 06/17/2020 01:44   Assessment/Plan GIB     - admit to obs,  progressive     - transfuse pRBCs, follow q6 H&H     - protonix IV BID     - spoke with Dr. Elnoria HowardHung, ok for CLD today  Generalized weakness Falls     -  likely secondary to above     - PT eval after GI evaluation and replacement of blood  HTN     - hold BP meds for right now as his pressures were initially soft, can slowly resume as he improves  HLD     - continue statin  Anxiety     - continue home regimen  GERD     - protonix IV BID  CAD s/p CAB     - hold ASA     - hold BB for right now; as BP improves, can resume     - statin  DVT prophylaxis: SCDs  Code Status: FULL  Family Communication: w/ friend by phone  Consults called: GI (Dr. Elnoria Howard) consulted  Status is: Observation  The patient remains OBS appropriate and will d/c before 2 midnights.  Dispo: The patient is from: Home              Anticipated d/c is to: Home              Patient currently is not medically stable to d/c.   Difficult to place patient No  Teddy Spike DO [facility]  If 7PM-7AM, please contact night-coverage [email or web address]  06/27/2020, 7:27 AM

## 2020-06-16 NOTE — Consult Note (Signed)
Reason for Consult: Symptomatic anemia and heme positive stool Referring Physician: [facility]  Guss C Matzek HPI: This is a 75 year old male with a PMH of CAD, chronic angina, DM, CKD, CVA, DM, and chronic need for oxygen admitted for complaints of weakness.  The patient was noted by a friend that he was having more falls over the past 4 days.  At one time he fell and hit his head, but there was no evidence of any syncope.  The patient was evaluated by his PCP and noted to have a drop in his HGB down to 7 g/dL. His baseline HGB ranges from 11- 13 g/dL.  There is a history of melena, but the patient is not completely certain.  Routinely he takes Plavix for his PVD, but he as instructed by his PCP to hold the medication.  His last colonoscopy was with Dr. Loreta Ave on 11/09/2007 for complaints of hematochezia.  The examination was normal.  Past Medical History:  Diagnosis Date  . CAD (coronary artery disease) of artery bypass graft 12/09/2013  . Chronic lower back pain   . CKD (chronic kidney disease)    stage 3a in 2019 and 2020  . Coronary artery disease   . Dementia (HCC)    "step dementia; from mini strokes in 2018" (11/08/2017)  . Depression with anxiety   . Diabetes mellitus    "totally diet controlled" (11/08/2017)  . Hearing loss    wears bilateral hearing aids  . High cholesterol   . History of kidney stones   . Hypertension   . MI (myocardial infarction) (HCC) [phone]   (several)  . On home oxygen therapy    "2L when he sleeps at night" (11/08/2017)  . Osteoarthritis of both knees    "both knees need replaced" (11/08/2017)  . Sleep apnea    "mild; uses oxygen" (11/08/2017)  . Stroke [facility]) 2018   "several silent ones; a little memory loss; step dementia" (11/08/2017)    Past Surgical History:  Procedure Laterality Date  . 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 CARDIAC CATHETERIZATION     "many"  . CAROTID  ENDARTERECTOMY Left 04/2013   Additionally, required re-exploration and evacuation of hematoma 1 week post op.    . COLONOSCOPY  11/1999   Ritta Slot MD.  Avg risk screening.  non-bleeding ext hemorrhoids, o/w normal study.    . CORONARY ARTERY BYPASS GRAFT  1996   5 vessels  . CORONARY/GRAFT ANGIOGRAPHY N/A 05/01/2018   Procedure: CORONARY/GRAFT ANGIOGRAPHY;  Surgeon: Yates Decamp, MD;  Location: [facility] INVASIVE CV LAB;  Service: Cardiovascular;  Laterality: N/A;  . KIDNEY STONE SURGERY  ~ 2014   "laparoscopic, robotic OR"  . LAPAROSCOPIC APPENDECTOMY N/A 06/03/2018   Procedure: APPENDECTOMY LAPAROSCOPIC;  Surgeon: Griselda Miner, MD;  Location: [facility] ORS;  Service: General;  Laterality: N/A;  . LOWER EXTREMITY ANGIOGRAPHY Bilateral 05/01/2018   Procedure: LOWER EXTREMITY ANGIOGRAPHY;  Surgeon: Yates Decamp, MD;  Location: [facility] INVASIVE CV LAB;  Service: Cardiovascular;  Laterality: Bilateral;  . PERIPHERAL VASCULAR ATHERECTOMY Left 05/01/2018   Procedure: PERIPHERAL VASCULAR ATHERECTOMY;  Surgeon: Yates Decamp, MD;  Location: [facility] INVASIVE CV LAB;  Service: Cardiovascular;  Laterality: Left;  SFA  . PERIPHERAL VASCULAR BALLOON ANGIOPLASTY  05/01/2018   Procedure: PERIPHERAL VASCULAR BALLOON ANGIOPLASTY;  Surgeon: Yates Decamp, MD;  Location: [facility] INVASIVE CV LAB;  Service: Cardiovascular;;  . WRIST FRACTURE SURGERY Left 2001  Family History  Problem Relation Age of Onset  . Heart disease Mother   . Heart disease Father   . Liver cancer Other   . Parkinson's disease Other   . Heart disease Brother   . Parkinsonism Sister   . Cancer Sister     Social History:  reports that he quit smoking about 36 years ago. His smoking use included cigarettes. He has a 40.00 pack-year smoking history. He has never used smokeless tobacco. He reports previous alcohol use. He reports current drug use. Drug: Marijuana.  Allergies:  Allergies  Allergen Reactions  . Penicillin G Other (See Comments)  . Penicillins Other  (See Comments)    DID THE REACTION INVOLVE: Swelling of the face/tongue/throat, SOB, or low BP? Unknown Sudden or severe rash/hives, skin peeling, or the inside of the mouth or nose? Unknown Did it require medical treatment? Unknown When did it last happen?70+ years ago If all above answers are "NO", may proceed with cephalosporin use.     Medications:  Scheduled: . ALPRAZolam  0.5 mg Oral QHS,MR X 1  . atorvastatin  20 mg Oral Daily  . DULoxetine  60 mg Oral QHS  . ezetimibe  10 mg Oral QPC supper  . [START ON 06/19/2020] pantoprazole  40 mg Intravenous Q12H   Continuous: . pantoprozole (PROTONIX) infusion 8 mg/hr (07/06/2020 0228)    Results for orders placed or performed during the hospital encounter of 06/14/2020 (from the past 24 hour(s))  Protime-INR     Status: None   Collection Time: 06/27/2020 12:27 AM  Result Value Ref Range   Prothrombin Time 14.3 11.4 - 15.2 seconds   INR 1.2 0.8 - 1.2  APTT     Status: None   Collection Time: 06/25/2020 12:27 AM  Result Value Ref Range   aPTT 33 24 - 36 seconds  ABO/Rh     Status: None   Collection Time: 06/25/2020 12:29 AM  Result Value Ref Range   ABO/RH(D)      A POS Performed at [facility], [address] [place], [place] [address]   CBC with Differential     Status: Abnormal   Collection Time: 06/28/2020 12:30 AM  Result Value Ref Range   WBC 6.5 4.0 - 10.5 K/uL   RBC 2.37 (L) 4.22 - 5.81 MIL/uL   Hemoglobin 7.2 (L) 13.0 - 17.0 g/dL   HCT 38.4 (L) 66.5 - 99.3 %   MCV 97.9 80.0 - 100.0 fL   MCH 30.4 26.0 - 34.0 pg   MCHC 31.0 30.0 - 36.0 g/dL   RDW 57.0 17.7 - 93.9 %   Platelets 207 150 - 400 K/uL   nRBC 0.0 0.0 - 0.2 %   Neutrophils Relative % 67 %   Neutro Abs 4.4 1.7 - 7.7 K/uL   Lymphocytes Relative 22 %   Lymphs Abs 1.4 0.7 - 4.0 K/uL   Monocytes Relative 10 %   Monocytes Absolute 0.6 0.1 - 1.0 K/uL   Eosinophils Relative 1 %   Eosinophils Absolute 0.0 0.0 - 0.5 K/uL   Basophils Relative  0 %   Basophils Absolute 0.0 0.0 - 0.1 K/uL   Immature Granulocytes 0 %   Abs Immature Granulocytes 0.02 0.00 - 0.07 K/uL  Comprehensive metabolic panel     Status: Abnormal   Collection Time: 06/09/2020 12:30 AM  Result Value Ref Range   Sodium 134 (L) 135 - 145 mmol/L   Potassium 5.5 (H) 3.5 - 5.1 mmol/L  Chloride 100 98 - 111 mmol/L   CO2 24 22 - 32 mmol/L   Glucose, Bld 107 (H) 70 - 99 mg/dL   BUN 55 (H) 8 - 23 mg/dL   Creatinine, Ser 5.39 (H) 0.61 - 1.24 mg/dL   Calcium 9.1 8.9 - 76.7 mg/dL   Total Protein 7.3 6.5 - 8.1 g/dL   Albumin 3.8 3.5 - 5.0 g/dL   AST 18 15 - 41 U/L   ALT 12 0 - 44 U/L   Alkaline Phosphatase 38 38 - 126 U/L   Total Bilirubin 0.9 0.3 - 1.2 mg/dL   GFR, Estimated 30 (L) >60 mL/min   Anion gap 10 5 - 15  Type and screen [facility]     Status: None (Preliminary result)   Collection Time: 07/08/2020 12:30 AM  Result Value Ref Range   ABO/RH(D) A POS    Antibody Screen NEG    Sample Expiration      06/19/2020,2359 Performed at [facility], [address], [place], [place] [address]    Unit Number [id number]    Blood Component Type RBC LR PHER2    Unit division 00    Status of Unit ISSUED    Transfusion Status OK TO TRANSFUSE    Crossmatch Result Compatible    Unit Number [id number]    Blood Component Type RED CELLS,LR    Unit division 00    Status of Unit DISCARDED    Transfusion Status OK TO TRANSFUSE    Crossmatch Result Compatible   Potassium     Status: Abnormal   Collection Time: 07/04/2020  1:39 AM  Result Value Ref Range   Potassium 5.5 (H) 3.5 - 5.1 mmol/L  SARS CORONAVIRUS 2 (TAT 6-24 HRS) Nasopharyngeal Nasopharyngeal Swab     Status: None   Collection Time: 06/24/2020  1:40 AM   Specimen: Nasopharyngeal Swab  Result Value Ref Range   SARS Coronavirus 2 NEGATIVE NEGATIVE  Prepare RBC (crossmatch)     Status: None   Collection Time: 06/30/2020  1:40 AM  Result Value Ref Range   Order  Confirmation      ORDER PROCESSED BY BLOOD BANK Performed at [facility], [address] [place], [place] [address]   POC occult blood, ED     Status: Abnormal   Collection Time: 07/05/2020  1:48 AM  Result Value Ref Range   Fecal Occult Bld POSITIVE (A) NEGATIVE  Urinalysis, Routine w reflex microscopic Urine, Clean Catch     Status: Abnormal   Collection Time: 07/01/2020  5:50 AM  Result Value Ref Range   Color, Urine AMBER (A) YELLOW   APPearance HAZY (A) CLEAR   Specific Gravity, Urine 1.020 1.005 - 1.030   pH 5.0 5.0 - 8.0   Glucose, UA NEGATIVE NEGATIVE mg/dL   Hgb urine dipstick NEGATIVE NEGATIVE   Bilirubin Urine NEGATIVE NEGATIVE   Ketones, ur 5 (A) NEGATIVE mg/dL   Protein, ur 30 (A) NEGATIVE mg/dL   Nitrite NEGATIVE NEGATIVE   Leukocytes,Ua NEGATIVE NEGATIVE   RBC / HPF 0-5 0 - 5 RBC/hpf   WBC, UA 6-10 0 - 5 WBC/hpf   Bacteria, UA RARE (A) NONE SEEN   Squamous Epithelial / LPF 0-5 0 - 5   Hyaline Casts, UA PRESENT   CBC     Status: Abnormal   Collection Time: 06/11/2020  5:50 AM  Result Value Ref Range   WBC 5.2 4.0 - 10.5 K/uL   RBC 2.51 (  L) 4.22 - 5.81 MIL/uL   Hemoglobin 7.6 (L) 13.0 - 17.0 g/dL   HCT 16.124.6 (L) 09.639.0 - 04.552.0 %   MCV 98.0 80.0 - 100.0 fL   MCH 30.3 26.0 - 34.0 pg   MCHC 30.9 30.0 - 36.0 g/dL   RDW 40.915.2 81.111.5 - 91.415.5 %   Platelets 165 150 - 400 K/uL   nRBC 0.0 0.0 - 0.2 %  Basic metabolic panel     Status: Abnormal   Collection Time: 06/28/2020  5:50 AM  Result Value Ref Range   Sodium 136 135 - 145 mmol/L   Potassium 4.7 3.5 - 5.1 mmol/L   Chloride 104 98 - 111 mmol/L   CO2 23 22 - 32 mmol/L   Glucose, Bld 96 70 - 99 mg/dL   BUN 51 (H) 8 - 23 mg/dL   Creatinine, Ser 7.821.90 (H) 0.61 - 1.24 mg/dL   Calcium 8.4 (L) 8.9 - 10.3 mg/dL   GFR, Estimated 36 (L) >60 mL/min   Anion gap 9 5 - 15  Hemoglobin     Status: Abnormal   Collection Time: 06/25/2020 11:24 AM  Result Value Ref Range   Hemoglobin 8.2 (L) 13.0 - 17.0 g/dL     CT  Head Wo Contrast  Result Date: 06/12/2020 CLINICAL DATA:  History of multiple falls today EXAM: CT HEAD WITHOUT CONTRAST TECHNIQUE: Contiguous axial images were obtained from the base of the skull through the vertex without intravenous contrast. COMPARISON:  03/29/2017 FINDINGS: Brain: No evidence of acute infarction, hemorrhage, hydrocephalus, extra-axial collection or mass lesion/mass effect. Chronic atrophic and ischemic changes are noted. Vascular: No hyperdense vessel or unexpected calcification. Skull: Normal. Negative for fracture or focal lesion. Sinuses/Orbits: No acute finding. Other: None. IMPRESSION: Chronic atrophic and ischemic changes without acute abnormality. Electronically Signed   By: Alcide CleverMark  Lukens [name]   On: 06/28/2020 01:44    ROS:  As stated above in the HPI otherwise negative.  Blood pressure 135/67, pulse 64, temperature (!) 97.5 F (36.4 C), temperature source Oral, resp. rate 20, SpO2 100 %.    PE: Gen: NAD, Alert and Oriented, slowed mentation HEENT:  [place]/AT, EOMI Neck: Supple, no LAD Lungs: CTA Bilaterally CV: RRR without M/G/R ABD: Soft, NTND, +BS Ext: No C/C/E  Assessment/Plan: 1) Anemia. 2) Melena. 3) Weakness.   There is a significant drop in the patient's HGB.  He is weak and his mentation is slow.  The patient requires an EGD/colonoscopy, but he knows that he cannot prep for the procedure.  An EGD will be performed and pending the results the patient may require a colonoscopy.    Plan: 1) EGD tomorrow with Dr. Loreta AveMann.  Kaila Devries D 06/12/2020, 12:16 PM

## 2020-06-16 NOTE — Plan of Care (Signed)

## 2020-06-16 NOTE — ED Triage Notes (Signed)
Pt arrives EMS from home after multiple falls. Admits top hitting head on floor. Seen by PCP and Plavix stopped today. HGB resulted at 7.7. Scheduled for double blood transfusion here today.

## 2020-06-17 ENCOUNTER — Inpatient Hospital Stay (HOSPITAL_COMMUNITY): Payer: Medicare PPO

## 2020-06-17 ENCOUNTER — Observation Stay (HOSPITAL_COMMUNITY): Payer: Medicare PPO

## 2020-06-17 ENCOUNTER — Encounter (HOSPITAL_COMMUNITY): Admission: EM | Disposition: E | Payer: Self-pay | Source: Home / Self Care | Attending: Internal Medicine

## 2020-06-17 DIAGNOSIS — S0093XA Contusion of unspecified part of head, initial encounter: Secondary | ICD-10-CM | POA: Diagnosis present

## 2020-06-17 DIAGNOSIS — G9341 Metabolic encephalopathy: Secondary | ICD-10-CM | POA: Diagnosis present

## 2020-06-17 DIAGNOSIS — M170 Bilateral primary osteoarthritis of knee: Secondary | ICD-10-CM | POA: Diagnosis present

## 2020-06-17 DIAGNOSIS — K922 Gastrointestinal hemorrhage, unspecified: Secondary | ICD-10-CM | POA: Diagnosis present

## 2020-06-17 DIAGNOSIS — N179 Acute kidney failure, unspecified: Secondary | ICD-10-CM | POA: Diagnosis present

## 2020-06-17 DIAGNOSIS — I130 Hypertensive heart and chronic kidney disease with heart failure and stage 1 through stage 4 chronic kidney disease, or unspecified chronic kidney disease: Secondary | ICD-10-CM | POA: Diagnosis present

## 2020-06-17 DIAGNOSIS — J9622 Acute and chronic respiratory failure with hypercapnia: Secondary | ICD-10-CM | POA: Diagnosis present

## 2020-06-17 DIAGNOSIS — Z66 Do not resuscitate: Secondary | ICD-10-CM | POA: Diagnosis not present

## 2020-06-17 DIAGNOSIS — I4891 Unspecified atrial fibrillation: Secondary | ICD-10-CM | POA: Diagnosis present

## 2020-06-17 DIAGNOSIS — J9602 Acute respiratory failure with hypercapnia: Secondary | ICD-10-CM | POA: Insufficient documentation

## 2020-06-17 DIAGNOSIS — Z20822 Contact with and (suspected) exposure to covid-19: Secondary | ICD-10-CM | POA: Diagnosis present

## 2020-06-17 DIAGNOSIS — I25810 Atherosclerosis of coronary artery bypass graft(s) without angina pectoris: Secondary | ICD-10-CM | POA: Diagnosis present

## 2020-06-17 DIAGNOSIS — K254 Chronic or unspecified gastric ulcer with hemorrhage: Secondary | ICD-10-CM | POA: Diagnosis present

## 2020-06-17 DIAGNOSIS — S40021A Contusion of right upper arm, initial encounter: Secondary | ICD-10-CM | POA: Diagnosis present

## 2020-06-17 DIAGNOSIS — I5033 Acute on chronic diastolic (congestive) heart failure: Secondary | ICD-10-CM | POA: Diagnosis present

## 2020-06-17 DIAGNOSIS — I21A1 Myocardial infarction type 2: Secondary | ICD-10-CM | POA: Diagnosis present

## 2020-06-17 DIAGNOSIS — E1151 Type 2 diabetes mellitus with diabetic peripheral angiopathy without gangrene: Secondary | ICD-10-CM | POA: Diagnosis present

## 2020-06-17 DIAGNOSIS — I25118 Atherosclerotic heart disease of native coronary artery with other forms of angina pectoris: Secondary | ICD-10-CM | POA: Diagnosis present

## 2020-06-17 DIAGNOSIS — W06XXXA Fall from bed, initial encounter: Secondary | ICD-10-CM | POA: Diagnosis present

## 2020-06-17 DIAGNOSIS — R296 Repeated falls: Secondary | ICD-10-CM | POA: Diagnosis present

## 2020-06-17 DIAGNOSIS — E1122 Type 2 diabetes mellitus with diabetic chronic kidney disease: Secondary | ICD-10-CM | POA: Diagnosis present

## 2020-06-17 DIAGNOSIS — G4733 Obstructive sleep apnea (adult) (pediatric): Secondary | ICD-10-CM | POA: Diagnosis present

## 2020-06-17 DIAGNOSIS — D62 Acute posthemorrhagic anemia: Secondary | ICD-10-CM | POA: Diagnosis present

## 2020-06-17 DIAGNOSIS — J449 Chronic obstructive pulmonary disease, unspecified: Secondary | ICD-10-CM | POA: Diagnosis present

## 2020-06-17 DIAGNOSIS — E782 Mixed hyperlipidemia: Secondary | ICD-10-CM | POA: Diagnosis present

## 2020-06-17 DIAGNOSIS — K3189 Other diseases of stomach and duodenum: Secondary | ICD-10-CM | POA: Diagnosis present

## 2020-06-17 DIAGNOSIS — N1832 Chronic kidney disease, stage 3b: Secondary | ICD-10-CM | POA: Diagnosis present

## 2020-06-17 DIAGNOSIS — Z9981 Dependence on supplemental oxygen: Secondary | ICD-10-CM | POA: Diagnosis not present

## 2020-06-17 LAB — COMPREHENSIVE METABOLIC PANEL
ALT: 11 U/L (ref 0–44)
AST: 17 U/L (ref 15–41)
Albumin: 3.7 g/dL (ref 3.5–5.0)
Alkaline Phosphatase: 39 U/L (ref 38–126)
Anion gap: 8 (ref 5–15)
BUN: 40 mg/dL — ABNORMAL HIGH (ref 8–23)
CO2: 27 mmol/L (ref 22–32)
Calcium: 9.3 mg/dL (ref 8.9–10.3)
Chloride: 103 mmol/L (ref 98–111)
Creatinine, Ser: 1.65 mg/dL — ABNORMAL HIGH (ref 0.61–1.24)
GFR, Estimated: 43 mL/min — ABNORMAL LOW (ref >60)
Glucose, Bld: 96 mg/dL (ref 70–99)
Potassium: 4.6 mmol/L (ref 3.5–5.1)
Sodium: 138 mmol/L (ref 135–145)
Total Bilirubin: 0.8 mg/dL (ref 0.3–1.2)
Total Protein: 7.3 g/dL (ref 6.5–8.1)

## 2020-06-17 LAB — BLOOD GAS, ARTERIAL
Acid-Base Excess: 0.5 mmol/L (ref 0.0–2.0)
Bicarbonate: 28.3 mmol/L — ABNORMAL HIGH (ref 20.0–28.0)
Drawn by: 270211
FIO2: 32
O2 Content: 3 L/min
O2 Saturation: 97.9 %
Patient temperature: 98.6
pCO2 arterial: 65.7 mmHg (ref 32.0–48.0)
pH, Arterial: 7.257 — ABNORMAL LOW (ref 7.350–7.450)
pO2, Arterial: 114 mmHg — ABNORMAL HIGH (ref 83.0–108.0)

## 2020-06-17 LAB — GLUCOSE, CAPILLARY
Glucose-Capillary: 99 mg/dL (ref 70–99)
Glucose-Capillary: 77 mg/dL (ref 70–99)
Glucose-Capillary: 83 mg/dL (ref 70–99)

## 2020-06-17 LAB — AMMONIA: Ammonia: 14 umol/L (ref 9–35)

## 2020-06-17 LAB — MRSA PCR SCREENING: MRSA by PCR: NEGATIVE

## 2020-06-17 LAB — BRAIN NATRIURETIC PEPTIDE: B Natriuretic Peptide: 1030.9 pg/mL — ABNORMAL HIGH (ref 0.0–100.0)

## 2020-06-17 SURGERY — ESOPHAGOGASTRODUODENOSCOPY (EGD) WITH PROPOFOL
Anesthesia: Monitor Anesthesia Care

## 2020-06-17 MED ORDER — FUROSEMIDE 10 MG/ML IJ SOLN
40.0000 mg | Freq: Two times a day (BID) | INTRAMUSCULAR | Status: AC
  Administered 2020-06-17 – 2020-06-18 (×3): 40 mg via INTRAVENOUS
  Filled 2020-06-17 (×3): qty 4

## 2020-06-17 MED ORDER — CHLORHEXIDINE GLUCONATE 0.12 % MT SOLN
15.0000 mL | Freq: Two times a day (BID) | OROMUCOSAL | Status: DC
  Administered 2020-06-17 – 2020-06-20 (×7): 15 mL via OROMUCOSAL
  Filled 2020-06-17 (×7): qty 15

## 2020-06-17 MED ORDER — HYDRALAZINE HCL 20 MG/ML IJ SOLN: 10.0000 mg | Freq: Four times a day (QID) | INTRAMUSCULAR | Status: DC | PRN

## 2020-06-17 MED ORDER — FUROSEMIDE 10 MG/ML IJ SOLN
20.0000 mg | Freq: Once | INTRAMUSCULAR | Status: AC
  Administered 2020-06-17: 20 mg via INTRAVENOUS
  Filled 2020-06-17: qty 2

## 2020-06-17 MED ORDER — ORAL CARE MOUTH RINSE
15.0000 mL | Freq: Two times a day (BID) | OROMUCOSAL | Status: DC
  Administered 2020-06-18 – 2020-06-20 (×4): 15 mL via OROMUCOSAL

## 2020-06-17 MED ORDER — CHLORHEXIDINE GLUCONATE CLOTH 2 % EX PADS
6.0000 | MEDICATED_PAD | Freq: Every day | CUTANEOUS | Status: DC
  Administered 2020-06-17 – 2020-06-19 (×3): 6 via TOPICAL

## 2020-06-17 NOTE — Progress Notes (Signed)
Subjective: [name] is a 75 year old white male with multiple medical problems including hypertension, hyperlipidemia, CAD, OSA who presented to the hospital with generalized weakness and falls and was found to be somewhat confused on the evaluation he was found to have a hemoglobin of 7 g/dL with guaiac positive stools and GI consult was procured.  He supposed to have an EGD today for further evaluation of his anemia but he became acutely short of breath and was noted to have a congestive heart failure with mild edema and right small right pleural effusion on chest x-ray.  CT of the head showed atrophy and chronic microvascular ischemia with no acute abnormalities compared to yesterday.  He was transferred to the ICU and is now on BiPAP.  He is requesting some water to drink.  He denies having any abdominal pain nausea vomiting.  Objective: Vital signs in last 24 hours: Temp:  [97.6 F (36.4 C)-98.2 F (36.8 C)] 98 F (36.7 C) (03/09 1600) Pulse Rate:  [65-81] 65 (03/09 1800) Resp:  [15-25] 24 (03/09 1800) BP: (137-172)/(50-70) 163/50 (03/09 1800) SpO2:  [100 %] 100 % (03/09 1800) Last BM Date: 06/14/2020  Intake/Output from previous day: 03/08 0701 - 03/09 0700 In: 646 [P.O.:120; I.V.:166; Blood:360] Out: 500 [Urine:500] Intake/Output this shift: No intake/output data recorded.  General appearance: cooperative, appears stated age, fatigued, mild distress and moderately obese Resp: clear to auscultation bilaterally Cardio: regular rate and rhythm, S1, S2 normal, no murmur, click, rub or gallop GI: soft, non-tender; bowel sounds normal; no masses,  no organomegaly Extremities: extremities normal, atraumatic, no cyanosis or edema  Lab Results: Recent Labs    07/09/2020 0030 06/15/2020 0550 06/20/2020 1124  WBC 6.5 5.2  --   HGB 7.2* 7.6* 8.2*  HCT 23.2* 24.6*  --   PLT 207 165  --    BMET Recent Labs    07/08/2020 0030 06/20/2020 0139 06/27/2020 0550 2020/06/26 0457  NA 134*  --   136 138  K 5.5* 5.5* 4.7 4.6  CL 100  --  104 103  CO2 24  --  23 27  GLUCOSE 107*  --  96 96  BUN 55*  --  51* 40*  CREATININE 2.22*  --  1.90* 1.65*  CALCIUM 9.1  --  8.4* 9.3   LFT Recent Labs    Jun 26, 2020 0457  PROT 7.3  ALBUMIN 3.7  AST 17  ALT 11  ALKPHOS 39  BILITOT 0.8   PT/INR Recent Labs    06/20/2020 0027  LABPROT 14.3  INR 1.2   Studies/Results: CT HEAD WO CONTRAST  Result Date: 06/26/2020 CLINICAL DATA:  Mental status change.  Recent fall. EXAM: CT HEAD WITHOUT CONTRAST TECHNIQUE: Contiguous axial images were obtained from the base of the skull through the vertex without intravenous contrast. COMPARISON:  CT head 06/11/2020 FINDINGS: Brain: Generalized atrophy. Moderate white matter changes most consistent with chronic ischemia. 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 Negative for acute infarct, hemorrhage, mass. Vascular: Negative for hyperdense vessel. Atherosclerotic calcification in the carotid and vertebral arteries bilaterally. Skull: Negative Sinuses/Orbits: Negative Other: None IMPRESSION: Atrophy and chronic microvascular ischemia. No acute abnormality no change from yesterday. Electronically Signed   By: Marlan Palau [name]   On: June 26, 2020 17:49   CT Head Wo Contrast  Result Date: 06/09/2020 CLINICAL DATA:  History of multiple falls today EXAM: CT HEAD WITHOUT CONTRAST TECHNIQUE: Contiguous axial images were obtained from the base of the skull through the  vertex without intravenous contrast. COMPARISON:  03/29/2017 FINDINGS: Brain: No evidence of acute infarction, hemorrhage, hydrocephalus, extra-axial collection or mass lesion/mass effect. Chronic atrophic and ischemic changes are noted. Vascular: No hyperdense vessel or unexpected calcification. Skull: Normal. Negative for fracture or focal lesion. Sinuses/Orbits: No acute finding. Other: None. IMPRESSION: Chronic atrophic and ischemic changes without acute abnormality.  Electronically Signed   By: Mark  Lukens [name]   On: 06/09/2020 01:44   DG CHEST PORT 1 VIEW  Result Date: 06/29/2020 CLINICAL DATA:  Dyspnea EXAM: PORTABLE CHEST 1 VIEW COMPARISON:  07/01/2020 FINDINGS: Postop CABG. Heart size upper normal. Vascular congestion and bilateral airspace disease likely due to edema. Small right pleural effusion and right lower lobe atelectasis unchanged. Improved aeration in the left lung base. IMPRESSION: Congestive heart failure. Mild edema and small right effusion. Improved aeration in the left lung base otherwise little interval change from earlier today. Electronically Signed   By: [name]  Clark [name]   On: 06/29/2020 13:55   DG CHEST PORT 1 VIEW  Result Date: 06/28/2020 CLINICAL DATA:  Dyspnea EXAM: PORTABLE CHEST 1 VIEW COMPARISON:  01/21/2011 FINDINGS: The lungs are symmetrically well expanded. Moderate right pleural effusion. Bibasilar pulmonary infiltrates are present most in keeping with mild interstitial pulmonary edema. No pneumothorax. Coronary artery bypass grafting has been performed. Cardiac size is within normal limits. No acute bone abnormality. IMPRESSION: Mild cardiogenic failure.  Moderate right pleural effusion. Electronically Signed   By: Ashesh  Parikh MD   On: 06/11/2020 01:44    Medications: I have reviewed the patient's current medications.  Assessment/Plan: 1) Iron deficiency anemia guaiac positive stools work-up pending-we will observe the patient's overall status tomorrow and then decide when his procedures can be done.  Hemoglobin is 8.2 g/dL after transfusion of 2 units of packed red blood cells 2) Acute metabolic encephalopathy. 3) Acute on chronic diastolic CHF. 4) Acute on chronic respiratory failure with hypercapnia. 5) Acute on chronic kidney disease. 6) COPD/CAD.  LOS: 0 days   Gradyn Shein 07/02/2020, 7:05 PM  

## 2020-06-17 NOTE — Progress Notes (Signed)
3/8 pt received 2u PRBC's awake & alert throughout day on RA but "belly breathing" at baseline throughout day. At completion of second unit pt stated "I'm anxious, I'm anxious" & appeared very SOB (94-96%)but pt was placed on 3L [place] for comfort & I stayed with pt until he calmed down a few min later (per notes & family he sleeps w/ 2L [place] at home.)   Overnight 3/8-3/9 increased SOB & restless (see night RN note) Chest XRAY-pleural effusion IV lasix given x 2 (see MAR)  3/9- pt much more lethargic today compared to yesterday. MD Rai notified of my assessment 0840 MD came to assess pt shortly after.  Hard to arouse but once awake is A/Ox3 -short responses & closes eyes again. Bilaterally strong grips, plantar flexion & no facial droop. Tried to call his contact Dorene Sorrow on his cell-went to voicemail. Updated patients wife.  RRT RN assessed pt - ABG showed PCO2 65.7 Pt placed on Bipap & transferred to ICU. Report given to ICU RN & all questions concerns addressed.  transferred w/ hearing aids/clothes.

## 2020-06-17 NOTE — Progress Notes (Signed)
Raymondo Band, friend of pt visiting.  Updated as to pt status.  Taking hearing aids and iPad home.

## 2020-06-17 NOTE — Progress Notes (Signed)
[facility]                                                                              Patient Demographics  [name], is a 75 y.o. male, DOB - 1945/09/19, WUJ:[id number]  Admit date - 07/09/2020   Admitting Physician Teddy Spike, DO  Outpatient Primary MD for the patient is [name], MD  Outpatient specialists:   LOS - 0  days   Medical records reviewed and are as summarized below:    Chief Complaint  Patient presents with  . Fall  . Abnormal Lab       Brief summary   Patient is a 75 year old male with history of hypertension, hyperlipidemia, CAD, obstructive sleep apnea presented with generalized weakness and falls.  Patient was noted to be somewhat confused, his friend reported that he has been generally weak and had falls for the last 4 days.  He had hit his head at least once but no loss of consciousness or syncopal episodes.  Hemoglobin was found to be 7 and significant drop from his previous readings.  Patient was sent to ED by PCP. Hemoglobin 7.2, was transfused 2 units packed RBCs.  GI was consulted.    Assessment & Plan    Principal Problem:   Acute upper GI bleed with underlying history of GERD -Patient noted to have hemoglobin of 7.2 on admission, previously 13.2 on 06/26/2019 -Patient was placed on IV Protonix, transfused 2 units packed RBCs -GI was consulted, plan for endoscopy today currently on hold due to acute respiratory failure and acute encephalopathy. -Hold aspirin  Active Problems:   Acute on chronic respiratory failure with hypercapnia (HCC) with acute metabolic encephalopathy -Patient noted to be lethargic this morning, had received Xanax overnight. -Overnight patient was noted to be tachypneic, short of breath and wheezing, was placed on 5 L O2 via [place].  Chest x-ray showed pleural effusion and patient was given Lasix 20 mg IV x1 -ABG showed PCO2 65.7, pH 7.2.  Discussed with patient's wife, patient used to be a heavy  smoker however had quit smoking, may have underlying emphysema.  She also reported that patient has history of obstructive sleep apnea, but had refused CPAP and was using O2 at night -BNP 1030, placed on IV Lasix 40 mg every 12 hours x3 doses -Placed on BiPAP, follow ABG    Acute on chronic diastolic CHF (congestive heart failure) (HCC) -Likely due to fluid overload and packed RBC transfusion.  BNP 1030, patient has a history of diastolic CHF -Previous echo on 06/02/2018 had shown EF of 50 to 55%, normal systolic function -Repeated chest x-ray which showed CHF, mild edema and small right effusion, -Continue Lasix 40 mg IV every 12 hours x3 doses, will reassess -Strict I's and O's and daily weights, will repeat 2D echo  Acute metabolic encephalopathy -Possibly due to hypercapnia, OSA not on CPAP, Xanax. 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 (HCC) -Baseline creatinine 1.4 -Presented with creatinine of 2.2 at the time of admission, continue to hold losartan -Creatinine improving, 1.6 -Currently placed on IV Lasix for diuresis, follow renal function     COPD (chronic obstructive pulmonary disease) (HCC), obstructive sleep apnea - Likely has underlying emphysema due to remote heavy smoking, has been diagnosed with OSA, not consistently using CPAP -May benefit from CPAP nightly however patient has declined in the past per his wife    Generalized weakness, falls -Once medically stable will obtain PT OT evaluation   Code Status: Full CODE STATUS DVT Prophylaxis:  SCDs Start: 06/15/2020 0222   Level of Care: Level of care: Stepdown Family Communication: Discussed all  imaging results, lab results, management explained to the patient's wife on the phone.   Disposition Plan:     Status is: Inpatient  Remains inpatient appropriate because:Inpatient level of care appropriate due to severity of illness   Dispo: The patient is from: Home              Anticipated d/c is to: TBD              Patient currently is not medically stable to d/c.  Currently requiring higher level of care for BiPAP ASAP for acute respiratory failure with hypercapnia, acute encephalopathy   Difficult to place patient No      Time Spent in minutes 45 minutes  Procedures:  None  Consultants:   Gastroenterology  Antimicrobials:   Anti-infectives (From admission, onward)   None         Medications  Scheduled Meds: . atorvastatin  20 mg Oral Daily  . DULoxetine  60 mg Oral QHS  . ezetimibe  10 mg Oral QPC supper  . furosemide  40 mg Intravenous Q12H  . [START ON 06/19/2020] pantoprazole  40 mg Intravenous Q12H   Continuous Infusions: . sodium chloride 20 mL/hr at 07/05/2020 1600  . pantoprozole (PROTONIX) infusion 8 mg/hr (06/14/2020 1248)   PRN Meds:.hydrALAZINE, ondansetron **OR** ondansetron (ZOFRAN) IV      Subjective:   Windy CannyRoyce Mazzuca was seen and examined today.  Patient seen twice today, continues to remain lethargic, able to open his eyes.  Shallow abdominal breathing.  No fevers or chills, active nausea or vomiting.    Objective:   Vitals:   06/15/2020 1235 06/13/2020 1315 06/20/2020 1318 06/12/2020 1330  BP: (!) 156/62  (!) 148/60 140/65  Pulse: 81 73 73 72  Resp:  (!) 25 18 (!) 22  Temp: 97.8 F (36.6 C)     TempSrc: Axillary     SpO2: 100% 100% 100% 100%    Intake/Output Summary (Last 24 hours) at 06/25/2020 1412 Last data filed at 07/04/2020 1248 Gross per 24 hour  Intake 646 ml  Output 450 ml  Net 196 ml     Wt Readings from Last 3 Encounters:  06/03/20 83 kg  03/23/20 83.9 kg  12/02/19 88.4 kg     Exam  General: Lethargic, arousable  does not follow commands  Cardiovascular: S1 S2 auscultated, no murmurs, RRR  Respiratory: Bibasilar crackles  Gastrointestinal: Soft, nontender, nondistended, + bowel sounds  Ext: no pedal edema bilaterally  Neuro: does not follow commands  Musculoskeletal: No digital cyanosis, clubbing  Skin: No rashes  Psych: lethargic   Data Reviewed:  I have personally reviewed following labs and imaging studies  Micro Results Recent Results (from the past 240 hour(s))  SARS CORONAVIRUS 2 (TAT 6-24 HRS) Nasopharyngeal Nasopharyngeal Swab     Status: None   Collection Time: 07/01/2020  1:40 AM   Specimen: Nasopharyngeal Swab  Result Value Ref Range Status   SARS Coronavirus 2 NEGATIVE NEGATIVE Final    Comment: (NOTE) SARS-CoV-2 target nucleic acids are NOT DETECTED.  The SARS-CoV-2 RNA is generally detectable in upper and lower respiratory specimens during the acute phase of infection. Negative results do not preclude SARS-CoV-2 infection, do not rule out co-infections with other pathogens, and should not be used as the sole basis for treatment or other patient management decisions. Negative results must be combined with clinical observations, patient history, and epidemiological information. The expected result is Negative.  Fact Sheet for Patients: HairSlick.no  Fact Sheet for Healthcare Providers: quierodirigir.com  This test is not yet approved or cleared by the [place] FDA and  has been authorized for detection and/or diagnosis of SARS-CoV-2 by FDA under an Emergency Use Authorization (EUA). This EUA will remain  in effect (meaning this test can be used) for the duration of the COVID-19 declaration under Se ction 564(b)(1) of the Act, 21 U.S.C. section 360bbb-3(b)(1), unless the authorization is terminated or revoked sooner.  Performed at [facility], [address] [place], [place] [address]      Radiology Reports CT Head Wo Contrast  Result Date: 07/09/2020 CLINICAL DATA:  History of multiple falls today EXAM: CT HEAD WITHOUT CONTRAST TECHNIQUE: Contiguous axial images were obtained from the base of the skull through the vertex without intravenous contrast. COMPARISON:  03/29/2017 FINDINGS: Brain: No evidence of acute infarction, hemorrhage, hydrocephalus, extra-axial collection or mass lesion/mass effect. Chronic atrophic and ischemic changes are noted. Vascular: No hyperdense vessel or unexpected calcification. Skull: Normal. Negative for fracture or focal lesion. Sinuses/Orbits: No acute finding. Other: None. IMPRESSION: Chronic atrophic and ischemic changes without acute abnormality. Electronically Signed   By: Alcide Clever [name]   On: 06/30/2020 01:44   DG CHEST PORT 1 VIEW  Result Date: 06-19-20 CLINICAL DATA:  Dyspnea EXAM: PORTABLE CHEST 1 VIEW COMPARISON:  2020/06/19 FINDINGS: Postop CABG. Heart size upper normal. Vascular congestion and bilateral airspace disease likely due to edema. Small right pleural effusion and right lower lobe atelectasis unchanged. Improved aeration in the left lung base. IMPRESSION: Congestive heart failure. Mild edema and small right effusion. Improved aeration in the left lung base otherwise little interval change from earlier today. Electronically Signed   By: Marlan Palau [name]   On: 06/19/20 13:55   DG CHEST PORT 1 VIEW  Result Date: 06/19/20 CLINICAL DATA:  Dyspnea EXAM: PORTABLE CHEST 1 VIEW COMPARISON:  01/21/2011 FINDINGS: The lungs are symmetrically well expanded. Moderate right pleural effusion. Bibasilar pulmonary infiltrates are present most in keeping with mild interstitial pulmonary edema. No pneumothorax. Coronary artery bypass grafting has been performed. Cardiac size is within normal limits. No acute bone abnormality. IMPRESSION: Mild cardiogenic failure.  Moderate right pleural effusion. 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 Stenosis in the right external carotid artery (<50%). Stenosis in the left external carotid artery (<50%). Left carotid endarterectomy site widely patent. Antegrade right vertebral artery flow. Antegrade left vertebral artery flow. Follow up in six months is appropriate if clinically indicated. No significant change from 10/18/2019.  Lab Data:  CBC: Recent Labs  Lab 06/25/2020 0030 06-25-20 0550 06-25-2020 1124  WBC 6.5 5.2  --   NEUTROABS 4.4  --   --   HGB 7.2* 7.6* 8.2*  HCT 23.2* 24.6*  --   MCV 97.9 98.0  --   PLT 207 165  --    Basic Metabolic Panel: Recent Labs  Lab Jun 25, 2020 0030 06-25-20 0139 06-25-2020 0550 07/09/2020 0457  NA 134*  --  136 138  K 5.5* 5.5* 4.7 4.6  CL 100  --  104 103  CO2 24  --  23 27  GLUCOSE 107*  --  96 96  BUN 55*  --  51* 40*  CREATININE 2.22*  --  1.90* 1.65*  CALCIUM 9.1  --  8.4* 9.3   GFR: CrCl cannot be calculated (Unknown ideal weight.). Liver Function Tests: Recent Labs  Lab 25-Jun-2020 0030 06/15/2020 0457  AST 18 17  ALT 12 11  ALKPHOS 38 39  BILITOT 0.9 0.8  PROT 7.3 7.3  ALBUMIN 3.8 3.7   No results for input(s): LIPASE, AMYLASE in the last 168 hours. No results for input(s): AMMONIA in the last 168 hours. Coagulation Profile: Recent Labs  Lab June 25, 2020 0027  INR 1.2   Cardiac Enzymes: No results for input(s): CKTOTAL, CKMB, CKMBINDEX, TROPONINI in the last 168 hours. BNP (last 3 results) No results for input(s): PROBNP in the last 8760 hours. HbA1C: No results for input(s): HGBA1C in the last 72 hours. CBG: No results for input(s): GLUCAP in the last 168 hours. Lipid Profile: No results for input(s): CHOL, HDL, LDLCALC, TRIG, CHOLHDL, LDLDIRECT in the last 72 hours. Thyroid Function Tests: No results for input(s): TSH, T4TOTAL, FREET4, T3FREE,  THYROIDAB in the last 72 hours. Anemia Panel: No results for input(s): VITAMINB12, FOLATE, FERRITIN, TIBC, IRON, RETICCTPCT in the last 72 hours. Urine analysis:    Component Value Date/Time   COLORURINE AMBER (A) 06-25-2020 0550   APPEARANCEUR HAZY (A) Jun 25, 2020 0550   LABSPEC 1.020 25-Jun-2020 0550   PHURINE 5.0 June 25, 2020 0550   GLUCOSEU NEGATIVE 2020-06-25 0550   HGBUR NEGATIVE 2020/06/25 0550   BILIRUBINUR NEGATIVE 06/25/20 0550   KETONESUR 5 (A) 2020/06/25 0550   PROTEINUR 30 (A) 06/25/20 0550   NITRITE NEGATIVE 2020-06-25 0550   LEUKOCYTESUR NEGATIVE 06/25/2020 0550     Nawaal Alling [name] [facility] 07/05/2020, 2:12 PM  Available via Epic secure chat 7am-7pm After 7 pm, please refer to night coverage provider listed on amion.

## 2020-06-17 NOTE — Progress Notes (Addendum)
Paged MD Arvilla Market @ [phone]  1502-[place], Pt SOB,tachypneic, wheezing and crackling. on 5L [place], SAT @ 100. received 2 units PRBC earlier, thanks Abby    Paged Paged MD Arvilla Market @ [phone] 1502-[place], CXR shows pleural effusion, Lasix given. Pt still in distress with increased WOB, thanks Abby

## 2020-06-17 NOTE — Significant Event (Signed)
Rapid Response Event Note   Reason for Call :  Decreased LOC: Lethargic  today   Initial Focused Assessment:  Patient is lethargic, does answer questions appropriately at this time.  Patient is alert and oriented x4 with episodes disorientation at baseline.  VSS. Noted increased WOB, breaths sounds diminished with faint crackles bases.   Interventions:  Stat ABG CXR Notified MD Lasix per order     Event Summary:  ABG resulted PH 7.2, Co2 65.7  CHF Mild Edema noted.  Transfer to ICU    MD Notified:  Gershon Crane, Elvera Lennox Call Time: 1300  Arrival Time: [id number] End Time:  1345.   Sharyn Lull Jairen Goldfarb, RN

## 2020-06-17 NOTE — H&P (View-Only) (Signed)
Subjective: [name] is a 75 year old white male with multiple medical problems including hypertension, hyperlipidemia, CAD, OSA who presented to the hospital with generalized weakness and falls and was found to be somewhat confused on the evaluation he was found to have a hemoglobin of 7 g/dL with guaiac positive stools and GI consult was procured.  He supposed to have an EGD today for further evaluation of his anemia but he became acutely short of breath and was noted to have a congestive heart failure with mild edema and right small right pleural effusion on chest x-ray.  CT of the head showed atrophy and chronic microvascular ischemia with no acute abnormalities compared to yesterday.  He was transferred to the ICU and is now on BiPAP.  He is requesting some water to drink.  He denies having any abdominal pain nausea vomiting.  Objective: Vital signs in last 24 hours: Temp:  [97.6 F (36.4 C)-98.2 F (36.8 C)] 98 F (36.7 C) (03/09 1600) Pulse Rate:  [65-81] 65 (03/09 1800) Resp:  [15-25] 24 (03/09 1800) BP: (137-172)/(50-70) 163/50 (03/09 1800) SpO2:  [100 %] 100 % (03/09 1800) Last BM Date: 06/14/2020  Intake/Output from previous day: 03/08 0701 - 03/09 0700 In: 646 [P.O.:120; I.V.:166; Blood:360] Out: 500 [Urine:500] Intake/Output this shift: No intake/output data recorded.  General appearance: cooperative, appears stated age, fatigued, mild distress and moderately obese Resp: clear to auscultation bilaterally Cardio: regular rate and rhythm, S1, S2 normal, no murmur, click, rub or gallop GI: soft, non-tender; bowel sounds normal; no masses,  no organomegaly Extremities: extremities normal, atraumatic, no cyanosis or edema  Lab Results: Recent Labs    07/09/2020 0030 06/15/2020 0550 06/20/2020 1124  WBC 6.5 5.2  --   HGB 7.2* 7.6* 8.2*  HCT 23.2* 24.6*  --   PLT 207 165  --    BMET Recent Labs    07/08/2020 0030 06/20/2020 0139 06/27/2020 0550 2020/06/26 0457  NA 134*  --   136 138  K 5.5* 5.5* 4.7 4.6  CL 100  --  104 103  CO2 24  --  23 27  GLUCOSE 107*  --  96 96  BUN 55*  --  51* 40*  CREATININE 2.22*  --  1.90* 1.65*  CALCIUM 9.1  --  8.4* 9.3   LFT Recent Labs    Jun 26, 2020 0457  PROT 7.3  ALBUMIN 3.7  AST 17  ALT 11  ALKPHOS 39  BILITOT 0.8   PT/INR Recent Labs    06/20/2020 0027  LABPROT 14.3  INR 1.2   Studies/Results: CT HEAD WO CONTRAST  Result Date: 06/26/2020 CLINICAL DATA:  Mental status change.  Recent fall. EXAM: CT HEAD WITHOUT CONTRAST TECHNIQUE: Contiguous axial images were obtained from the base of the skull through the vertex without intravenous contrast. COMPARISON:  CT head 06/11/2020 FINDINGS: Brain: Generalized atrophy. Moderate white matter changes most consistent with chronic ischemia. 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 Negative for acute infarct, hemorrhage, mass. Vascular: Negative for hyperdense vessel. Atherosclerotic calcification in the carotid and vertebral arteries bilaterally. Skull: Negative Sinuses/Orbits: Negative Other: None IMPRESSION: Atrophy and chronic microvascular ischemia. No acute abnormality no change from yesterday. Electronically Signed   By: Marlan Palau [name]   On: June 26, 2020 17:49   CT Head Wo Contrast  Result Date: 06/09/2020 CLINICAL DATA:  History of multiple falls today EXAM: CT HEAD WITHOUT CONTRAST TECHNIQUE: Contiguous axial images were obtained from the base of the skull through the  vertex without intravenous contrast. COMPARISON:  03/29/2017 FINDINGS: Brain: No evidence of acute infarction, hemorrhage, hydrocephalus, extra-axial collection or mass lesion/mass effect. Chronic atrophic and ischemic changes are noted. Vascular: No hyperdense vessel or unexpected calcification. Skull: Normal. Negative for fracture or focal lesion. Sinuses/Orbits: No acute finding. Other: None. IMPRESSION: Chronic atrophic and ischemic changes without acute abnormality.  Electronically Signed   By: Alcide Clever [name]   On: 06/26/2020 01:44   DG CHEST PORT 1 VIEW  Result Date: 06/19/2020 CLINICAL DATA:  Dyspnea EXAM: PORTABLE CHEST 1 VIEW COMPARISON:  06/20/2020 FINDINGS: Postop CABG. Heart size upper normal. Vascular congestion and bilateral airspace disease likely due to edema. Small right pleural effusion and right lower lobe atelectasis unchanged. Improved aeration in the left lung base. IMPRESSION: Congestive heart failure. Mild edema and small right effusion. Improved aeration in the left lung base otherwise little interval change from earlier today. Electronically Signed   By: Marlan Palau [name]   On: 06/20/2020 13:55   DG CHEST PORT 1 VIEW  Result Date: 07/07/2020 CLINICAL DATA:  Dyspnea EXAM: PORTABLE CHEST 1 VIEW COMPARISON:  01/21/2011 FINDINGS: The lungs are symmetrically well expanded. Moderate right pleural effusion. Bibasilar pulmonary infiltrates are present most in keeping with mild interstitial pulmonary edema. No pneumothorax. Coronary artery bypass grafting has been performed. Cardiac size is within normal limits. No acute bone abnormality. IMPRESSION: Mild cardiogenic failure.  Moderate right pleural effusion. Electronically Signed   By: Helyn Numbers MD   On: 07/06/2020 01:44    Medications: I have reviewed the patient's current medications.  Assessment/Plan: 1) Iron deficiency anemia guaiac positive stools work-up pending-we will observe the patient's overall status tomorrow and then decide when his procedures can be done.  Hemoglobin is 8.2 g/dL after transfusion of 2 units of packed red blood cells 2) Acute metabolic encephalopathy. 3) Acute on chronic diastolic CHF. 4) Acute on chronic respiratory failure with hypercapnia. 5) Acute on chronic kidney disease. 6) COPD/CAD.  LOS: 0 days   Charna Elizabeth 07/03/2020, 7:05 PM

## 2020-06-18 ENCOUNTER — Encounter (HOSPITAL_COMMUNITY): Payer: Self-pay | Admitting: Internal Medicine

## 2020-06-18 ENCOUNTER — Encounter (HOSPITAL_COMMUNITY): Admission: EM | Disposition: E | Payer: Self-pay | Source: Home / Self Care | Attending: Internal Medicine

## 2020-06-18 ENCOUNTER — Inpatient Hospital Stay (HOSPITAL_COMMUNITY): Payer: Medicare PPO | Admitting: Anesthesiology

## 2020-06-18 ENCOUNTER — Other Ambulatory Visit (HOSPITAL_COMMUNITY): Payer: Medicare PPO

## 2020-06-18 DIAGNOSIS — J9622 Acute and chronic respiratory failure with hypercapnia: Secondary | ICD-10-CM | POA: Diagnosis not present

## 2020-06-18 DIAGNOSIS — K922 Gastrointestinal hemorrhage, unspecified: Secondary | ICD-10-CM | POA: Diagnosis not present

## 2020-06-18 DIAGNOSIS — I5033 Acute on chronic diastolic (congestive) heart failure: Secondary | ICD-10-CM | POA: Diagnosis not present

## 2020-06-18 DIAGNOSIS — N179 Acute kidney failure, unspecified: Secondary | ICD-10-CM | POA: Diagnosis not present

## 2020-06-18 HISTORY — PX: BIOPSY: SHX5522

## 2020-06-18 HISTORY — PX: ESOPHAGOGASTRODUODENOSCOPY (EGD) WITH PROPOFOL: SHX5813

## 2020-06-18 LAB — CBC
HCT: 31.3 % — ABNORMAL LOW (ref 39.0–52.0)
Hemoglobin: 10 g/dL — ABNORMAL LOW (ref 13.0–17.0)
MCH: 29.8 pg (ref 26.0–34.0)
MCHC: 31.9 g/dL (ref 30.0–36.0)
MCV: 93.2 fL (ref 80.0–100.0)
Platelets: 188 10*3/uL (ref 150–400)
RBC: 3.36 MIL/uL — ABNORMAL LOW (ref 4.22–5.81)
RDW: 15.6 % — ABNORMAL HIGH (ref 11.5–15.5)
WBC: 6 10*3/uL (ref 4.0–10.5)
nRBC: 0 % (ref 0.0–0.2)

## 2020-06-18 LAB — BRAIN NATRIURETIC PEPTIDE: B Natriuretic Peptide: 651.7 pg/mL — ABNORMAL HIGH (ref 0.0–100.0)

## 2020-06-18 LAB — BASIC METABOLIC PANEL
Anion gap: 10 (ref 5–15)
BUN: 34 mg/dL — ABNORMAL HIGH (ref 8–23)
CO2: 31 mmol/L (ref 22–32)
Calcium: 9.2 mg/dL (ref 8.9–10.3)
Chloride: 98 mmol/L (ref 98–111)
Creatinine, Ser: 1.54 mg/dL — ABNORMAL HIGH (ref 0.61–1.24)
GFR, Estimated: 46 mL/min — ABNORMAL LOW (ref >60)
Glucose, Bld: 94 mg/dL (ref 70–99)
Potassium: 3.8 mmol/L (ref 3.5–5.1)
Sodium: 139 mmol/L (ref 135–145)

## 2020-06-18 LAB — GLUCOSE, CAPILLARY
Glucose-Capillary: 102 mg/dL — ABNORMAL HIGH (ref 70–99)
Glucose-Capillary: 82 mg/dL (ref 70–99)
Glucose-Capillary: 93 mg/dL (ref 70–99)
Glucose-Capillary: 120 mg/dL — ABNORMAL HIGH (ref 70–99)

## 2020-06-18 SURGERY — ESOPHAGOGASTRODUODENOSCOPY (EGD) WITH PROPOFOL
Anesthesia: Monitor Anesthesia Care

## 2020-06-18 MED ORDER — ALPRAZOLAM 0.5 MG PO TABS
0.5000 mg | ORAL_TABLET | Freq: Once | ORAL | Status: AC
  Administered 2020-06-18: 0.5 mg via ORAL
  Filled 2020-06-18: qty 1

## 2020-06-18 MED ORDER — FUROSEMIDE 10 MG/ML IJ SOLN
40.0000 mg | Freq: Two times a day (BID) | INTRAMUSCULAR | Status: DC
  Administered 2020-06-18: 40 mg via INTRAVENOUS
  Filled 2020-06-18: qty 4

## 2020-06-18 MED ORDER — ONDANSETRON HCL 4 MG/2ML IJ SOLN
INTRAMUSCULAR | Status: DC | PRN
  Administered 2020-06-18: 4 mg via INTRAVENOUS

## 2020-06-18 MED ORDER — PROPOFOL 500 MG/50ML IV EMUL
INTRAVENOUS | Status: DC | PRN
  Administered 2020-06-18: 75 ug/kg/min via INTRAVENOUS

## 2020-06-18 MED ORDER — LACTATED RINGERS IV SOLN: INTRAVENOUS | Status: DC | PRN

## 2020-06-18 MED ORDER — PROPOFOL 500 MG/50ML IV EMUL
INTRAVENOUS | Status: AC
  Filled 2020-06-18: qty 50

## 2020-06-18 MED ORDER — LIDOCAINE 2% (20 MG/ML) 5 ML SYRINGE
INTRAMUSCULAR | Status: DC | PRN
  Administered 2020-06-18: 40 mg via INTRAVENOUS

## 2020-06-18 MED ORDER — LORAZEPAM 0.5 MG PO TABS
0.5000 mg | ORAL_TABLET | Freq: Two times a day (BID) | ORAL | Status: DC | PRN
  Administered 2020-06-18 – 2020-06-20 (×3): 0.5 mg via ORAL
  Filled 2020-06-18 (×3): qty 1

## 2020-06-18 MED ORDER — SODIUM CHLORIDE 0.9 % IV SOLN: INTRAVENOUS | Status: DC | PRN

## 2020-06-18 SURGICAL SUPPLY — 15 items

## 2020-06-18 NOTE — Anesthesia Preprocedure Evaluation (Signed)
Anesthesia Evaluation  Patient identified by MRN, date of birth, ID band Patient awake    Reviewed: Allergy & Precautions, NPO status , Patient's Chart, lab work & pertinent test results  Airway Mallampati: II  TM Distance: >3 FB Neck ROM: Full    Dental no notable dental hx.    Pulmonary sleep apnea and Oxygen sleep apnea , former smoker,    Pulmonary exam normal breath sounds clear to auscultation       Cardiovascular hypertension, + CAD, + Past MI, + CABG and +CHF  Normal cardiovascular exam Rhythm:Regular Rate:Normal     Neuro/Psych Dementia TIA   GI/Hepatic negative GI ROS, Neg liver ROS,   Endo/Other  negative endocrine ROSdiabetes  Renal/GU negative Renal ROS  negative genitourinary   Musculoskeletal negative musculoskeletal ROS (+)   Abdominal   Peds negative pediatric ROS (+)  Hematology negative hematology ROS (+)   Anesthesia Other Findings   Reproductive/Obstetrics negative OB ROS                             Anesthesia Physical Anesthesia Plan  ASA: IV  Anesthesia Plan: MAC   Post-op Pain Management:    Induction: Intravenous  PONV Risk Score and Plan: 1 and Propofol infusion and Treatment may vary due to age or medical condition  Airway Management Planned: Nasal Cannula  Additional Equipment:   Intra-op Plan:   Post-operative Plan:   Informed Consent: I have reviewed the patients History and Physical, chart, labs and discussed the procedure including the risks, benefits and alternatives for the proposed anesthesia with the patient or authorized representative who has indicated his/her understanding and acceptance.     Dental advisory given  Plan Discussed with: CRNA and Surgeon  Anesthesia Plan Comments:         Anesthesia Quick Evaluation

## 2020-06-18 NOTE — Op Note (Signed)
[facility] Patient Name: [name] Procedure Date: 06/27/2020 MRN: [id number] Attending MD: Jeani Hawking , MD Date of Birth: 31-Jan-1946 CSN: [id number] Age: 75 Admit Type: Inpatient Procedure:                Upper GI endoscopy Indications:              Melena Providers:                Jeani Hawking, MD, Dwain Sarna, RN, Beryle Beams,                            Technician, Lawson Radar, Technician, Maricela Curet, CRNA Referring MD:              Medicines:                Propofol per Anesthesia Complications:            No immediate complications. Estimated Blood Loss:     Estimated blood loss was minimal. Procedure:                Pre-Anesthesia Assessment:                           - Prior to the procedure, a History and Physical                            was performed, and patient medications and                            allergies were reviewed. The patient's tolerance of                            previous anesthesia was also reviewed. The risks                            and benefits of the procedure and the sedation                            options and risks were discussed with the patient.                            All questions were answered, and informed consent                            was obtained. Prior Anticoagulants: The patient has                            taken Plavix (clopidogrel), last dose was 3 days                            prior to procedure. ASA Grade Assessment: III - A  patient with severe systemic disease. After                            reviewing the risks and benefits, the patient was                            deemed in satisfactory condition to undergo the                            procedure.                           - Sedation was administered by an anesthesia                            professional. Deep sedation was attained.                           After obtaining  informed consent, the endoscope was                            passed under direct vision. Throughout the                            procedure, the patient's blood pressure, pulse, and                            oxygen saturations were monitored continuously. The                            GIF-H190 ([id number]) Olympus gastroscope was                            introduced through the mouth, and advanced to the                            third part of duodenum. The upper GI endoscopy was                            accomplished without difficulty. The patient                            tolerated the procedure well. Scope In: Scope Out: Findings:      The esophagus was normal.      Multiple dispersed small erosions with no stigmata of recent bleeding       were found in the gastric antrum. Biopsies were taken with a cold       forceps for histology.      Patchy mildly erythematous mucosa without active bleeding and with no       stigmata of bleeding was found in the duodenal bulb. Impression:               - Normal esophagus.                           - Erosive  gastropathy with no stigmata of recent                            bleeding. Biopsied.                           - Erythematous duodenopathy. Moderate Sedation:      Not Applicable - Patient had care per Anesthesia. Recommendation:           - Return patient to hospital ward for ongoing care.                           - Resume regular diet.                           - Continue present medications.                           - Await pathology results.                           - PPI QD.                           - Monitor HGB and transfuse if necessary.                           - A colonoscopy is warranted, but he is in poor                            health. The patient is on chronic oxygen and he was                            transferred to the ICU for fluid overload requiring                            BIPAP yesterday. At this  point it is best to                            monitor his HGB. If there is significant decline,                            then the colonoscopy will be scheduled.                           - Continue to hold Plavix for now to determine if                            his HGB can be stabilized. Procedure Code(s):        --- Professional ---                           [phone], Esophagogastroduodenoscopy, flexible,  transoral; with biopsy, single or multiple Diagnosis Code(s):        --- Professional ---                           K31.89, Other diseases of stomach and duodenum                           K92.1, Melena (includes Hematochezia) CPT copyright 2019 American Medical Association. All rights reserved. The codes documented in this report are preliminary and upon coder review may  be revised to meet current compliance requirements. Jeani HawkingPatrick Laqueisha Catalina, MD Jeani HawkingPatrick Robbin Escher, MD 07/06/2020 1:55:13 PM This report has been signed electronically. Number of Addenda: 0

## 2020-06-18 NOTE — TOC Initial Note (Signed)
Transition of Care [facility]) - Initial/Assessment Note    Patient Details  Name: [name] MRN: [id number] Date of Birth: Nov 23, 1945  [facility] CM/SW Contact:    Golda Acre, RN Phone Number: 06/10/2020, 8:13 AM  Clinical Narrative:                 75 y.o. male with medical history significant of HTN, HLD, CAD. Presenting with weakness and falls. Limited history is per friend as the patient is somewhat confused and is a poor historian. The friend reports that the patient has been generally weak and had falls for the last 4 days. He has hit his head at least once, but has no LOC or syncopal episodes. He became concerned and brought him to his PCP. They found his Hgb to be 7, which apparently was a significant drop from his previous reading. It was recommended that he come to the ED for assistance.   ED Course: His Hgb was 7.2. He was transfused 2 units pRBCs. TRH was called for admission.  PLAN: hgb on 06/27/2020 am is now 10.0, plan is to return to home with wife. Expected Discharge Plan: Home/Self Care Barriers to Discharge: Continued Medical Work up   Patient Goals and CMS Choice Patient states their goals for this hospitalization and ongoing recovery are:: to go home CMS Medicare.gov Compare Post Acute Care list provided to:: Patient    Expected Discharge Plan and Services Expected Discharge Plan: Home/Self Care   Discharge Planning Services: CM Consult   Living arrangements for the past 2 months: Single Family Home                                      Prior Living Arrangements/Services Living arrangements for the past 2 months: Single Family Home Lives with:: Spouse Patient language and need for interpreter reviewed:: Yes Do you feel safe going back to the place where you live?: Yes      Need for Family Participation in Patient Care: Yes (Comment) Care giver support system in place?: Yes (comment)   Criminal Activity/Legal Involvement Pertinent  to Current Situation/Hospitalization: No - Comment as needed  Activities of Daily Living Home Assistive Devices/Equipment: Eyeglasses,CBG Meter,Walker (specify type),Hearing aid (bilateral hearing aids) ADL Screening (condition at time of admission) Patient's cognitive ability adequate to safely complete daily activities?: Yes Is the patient deaf or have difficulty hearing?: No Does the patient have difficulty seeing, even when wearing glasses/contacts?: No Does the patient have difficulty concentrating, remembering, or making decisions?: Yes Patient able to express need for assistance with ADLs?: Yes Does the patient have difficulty dressing or bathing?: No Independently performs ADLs?: No Communication: Independent Dressing (OT): Independent Grooming: Independent Feeding: Independent Bathing: Independent Toileting: Independent with device (comment) In/Out Bed: Independent Walks in Home: Independent with device (comment) Does the patient have difficulty walking or climbing stairs?: Yes (secondary to weakness) Weakness of Legs: Both Weakness of Arms/Hands: None  Permission Sought/Granted                  Emotional Assessment Appearance:: Appears stated age Attitude/Demeanor/Rapport: Engaged Affect (typically observed): Calm Orientation: : Oriented to Self,Oriented to Place,Oriented to  Time,Oriented to Situation Alcohol / Substance Use: Not Applicable Psych Involvement: No (comment)  Admission diagnosis:  UGI bleed [K92.2] Generalized weakness [R53.1] AKI (acute kidney injury) (HCC) [N17.9] Symptomatic anemia [D64.9] Acute upper GI bleed [K92.2] Patient Active Problem  List   Diagnosis Date Noted  . Acute upper GI bleed 06/25/2020  . COPD (chronic obstructive pulmonary disease) (HCC) 07/05/2020  . Obstructive sleep apnea 07/04/2020  . Acute on chronic respiratory failure with hypercapnia (HCC) 06/20/2020  . Acute on chronic diastolic CHF (congestive heart failure)  (HCC) 06/16/2020  . UGI bleed 06/26/2020  . Acute appendicitis 05/31/2018  . Unilateral primary osteoarthritis, left knee 05/22/2018  . Unilateral primary osteoarthritis, right knee 05/22/2018  . Claudication in peripheral vascular disease (HCC) 04/30/2018  . IBS (irritable bowel syndrome) 11/12/2017  . Appendicitis, acute 11/08/2017  . Supplemental oxygen dependent 11/07/2017  . MCI (mild cognitive impairment) with memory loss 11/07/2017  . Sleep-related hypoxia 06/26/2017  . Chronic diastolic CHF (congestive heart failure) (HCC) 06/26/2017  . Stroke-like symptoms 03/30/2017  . CKD stage 3 due to type 2 diabetes mellitus (HCC)   . Benign essential HTN   . TIA (transient ischemic attack) 03/29/2017  . 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 History of completed stroke 10/24/2016  . 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 Sleep related hypoventilation/hypoxemia in other disease 01/15/2014  . Primary snoring 12/09/2013  . Hypersomnia with sleep apnea 12/09/2013  . CAD (coronary artery disease) of artery bypass graft 12/09/2013   PCP:  [name], MD Pharmacy:   [phone] - [place], [place] - [address] AT [facility] [address] [place] [address] Phone: [phone] Fax: [phone]     Social Determinants of Health (SDOH) Interventions    Readmission Risk Interventions No flowsheet data found.

## 2020-06-18 NOTE — Transfer of Care (Signed)
Immediate Anesthesia Transfer of Care Note  Patient: [name]  Procedure(s) Performed: ESOPHAGOGASTRODUODENOSCOPY (EGD) WITH PROPOFOL (N/A ) BIOPSY  Patient Location: PACU and Endoscopy Unit  Anesthesia Type:MAC  Level of Consciousness: awake, alert  and oriented  Airway & Oxygen Therapy: Patient Spontanous Breathing and Patient connected to face mask oxygen  Post-op Assessment: Report given to RN and Post -op Vital signs reviewed and stable  Post vital signs: Reviewed and stable  Last Vitals:  Vitals Value Taken Time  BP    Temp    Pulse    Resp    SpO2      Last Pain:  Vitals:   07/06/2020 1321  TempSrc: Oral  PainSc: 0-No pain         Complications: No complications documented.

## 2020-06-18 NOTE — Interval H&P Note (Signed)
History and Physical Interval Note:  07/03/2020 1:21 PM  [name]  has presented today for surgery, with the diagnosis of IDA.  The various methods of treatment have been discussed with the patient and family. After consideration of risks, benefits and other options for treatment, the patient has consented to  Procedure(s): ESOPHAGOGASTRODUODENOSCOPY (EGD) WITH PROPOFOL (N/A) as a surgical intervention.  The patient's history has been reviewed, patient examined, no change in status, stable for surgery.  I have reviewed the patient's chart and labs.  Questions were answered to the patient's satisfaction.     [name]

## 2020-06-18 NOTE — Progress Notes (Signed)
[facility]                                                                              Patient Demographics  [name], is a 75 y.o. male, DOB - May 01, 1945, ZOX:[id number]  Admit date - 07-15-20   [name]  Outpatient Primary Brooks for the patient is [name]  Outpatient specialists:   LOS - 1  days   Medical records reviewed and are as summarized below:    Chief Complaint  Patient presents with  . Fall  . Abnormal Lab       Brief summary   Patient is a 75 year old male with history of hypertension, hyperlipidemia, CAD, obstructive sleep apnea presented with generalized weakness and falls.  Patient was noted to be somewhat confused, his friend reported that he has been generally weak and had falls for the last 4 days.  He had hit his head at least once but no loss of consciousness or syncopal episodes.  Hemoglobin was found to be 7 and significant drop from his previous readings.  Patient was sent to ED by PCP. Hemoglobin 7.2, was transfused 2 units packed RBCs.  GI was consulted.    Assessment & Plan    Principal Problem:   Acute upper GI bleed with underlying history of GERD -Patient noted to have hemoglobin of 7.2 on admission, previously 13.2 on 06/26/2019 -Patient was placed on IV Protonix, transfused 2 units packed RBCs -H&H now stable 10.0 -GI was consulted, underwent endoscopy 3/10, normal esophagus, erosive gastropathy with no stigmata of bleeding, erythematous duodenopathy.  Recommended colonoscopy however monitor hemoglobin for now, if declined significantly then will pursue colonoscopy.  Active Problems:   Acute on chronic respiratory failure with hypercapnia (HCC) with acute metabolic encephalopathy -Overnight on 3/9, patient was noted to be in fluid overload, had received packed RBC transfusion.  In the morning was lethargic, tachypneic and short of breath.  Was placed on 5 L O2 via [place], fluid overload on  the chest x-ray.  BNP 1030 -ABG showed PCO2 65.7, pH 7.2.  Per patient's wife, used to be heavy smoker and has history of OSA, had refused CPAP but was using 2 L O2 at night.  -Was placed on BiPAP and transferred to SDU -Improving today, off the BiPAP, O2 sats 100% on 3 L via [place] - will place on BiPAP nightly    Acute on chronic diastolic CHF (congestive heart failure) (HCC) -Likely due to fluid overload and packed RBC transfusion.  BNP 1030, patient has a history of diastolic CHF -Previous echo on 06/02/2018 had shown EF of 50 to 55%, normal systolic function, repeat echo pending -Patient was placed on Lasix 40 mg IV every 12 hours for 3 doses, will continue, BNP improving 651. -Cardiology, Dr [name] consulted per patient's wife's request.  Continue strict I's and O's and daily weights   Acute metabolic encephalopathy -Possibly due to hypercapnia, OSA not on CPAP, Xanax. -CT head on admission showed no intracranial bleed.  Repeat CT head showed no changes.    CAD (coronary artery disease) of artery bypass graft -Currently improving, no chest  pain, no acute shortness of breath.  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 type 2 diabetes mellitus (HCC) -Baseline creatinine 1.4 -Presented with creatinine of 2.2 on admission, continue to hold losartan -Creatinine improving, 1.5 -Monitor renal function with IV Lasix diuresis     COPD (chronic obstructive pulmonary disease) (HCC), obstructive sleep apnea - Likely has underlying emphysema due to remote heavy smoking, has been diagnosed with OSA, not consistently using CPAP -May benefit from CPAP nightly however patient has declined in the past per his wife   Generalized weakness, falls -PT OT evaluation   Code Status: Full CODE STATUS DVT Prophylaxis:  SCDs Start: 06/10/2020 0222   Level of Care: Level of care: Stepdown Family Communication: Discussed all imaging results, lab results,  management explained to the patient's wife on the phone on 3/9 (phone number [phone]).  Unable to make contact today, left detailed voicemail message    Disposition Plan:     Status is: Inpatient  Remains inpatient appropriate because:Inpatient level of care appropriate due to severity of illness   Dispo: The patient is from: Home              Anticipated d/c is to: TBD              Patient currently is not medically stable to d/c.  EGD today, start PT OT evaluation, on IV Lasix diuresis, not ready for discharge yet   Difficult to place patient No      Time Spent in minutes 45 minutes  Procedures:  None  Consultants:   Gastroenterology  Antimicrobials:   Anti-infectives (From admission, onward)   None         Medications  Scheduled Meds: . atorvastatin  20 mg Oral Daily  . chlorhexidine  15 mL Mouth Rinse BID  . Chlorhexidine Gluconate Cloth  6 each Topical Daily  . DULoxetine  60 mg Oral QHS  . ezetimibe  10 mg Oral QPC supper  . mouth rinse  15 mL Mouth Rinse q12n4p  . [START ON 06/19/2020] pantoprazole  40 mg Intravenous Q12H   Continuous Infusions: . pantoprozole (PROTONIX) infusion 8 mg/hr (06/12/2020 0005)   PRN Meds:.hydrALAZINE, LORazepam, ondansetron **OR** ondansetron (ZOFRAN) IV      Subjective:   [name] was seen and examined today.  Patient seen in the morning, much more alert and oriented today.  Off the BiPAP.  No nausea vomiting or abdominal pain.  No fevers or chills, active nausea or vomiting.    Objective:   Vitals:   07/04/2020 1350 06/11/2020 1400 06/19/2020 1410 07/05/2020 1420  BP: (!) 106/44 (!) 111/40 (!) 122/32 (!) 130/37  Pulse: 66 70 67 69  Resp: 20 (!) 24 (!) 22 (!) 23  Temp:      TempSrc:      SpO2: 100% 100% 100% 100%  Weight:        Intake/Output Summary (Last 24 hours) at 07/05/2020 1456 Last data filed at 06/27/2020 1346 Gross per 24 hour  Intake 300 ml  Output 1490 ml  Net -1190 ml     Wt Readings from  Last 3 Encounters:  07/09/2020 78.5 kg  06/03/20 83 kg  03/23/20 83.9 kg    Physical Exam  General: Much more alert and oriented to self and person but not time  Cardiovascular: S1 S2 clear, RRR. No pedal edema b/l  Respiratory: Decreased breath sounds at the bases, improving  Gastrointestinal: Soft,  nontender, nondistended, NBS  Ext: no pedal edema bilaterally  Neuro: no new deficits  Musculoskeletal: No cyanosis, clubbing  Skin: No rashes  Psych: much more alert and oriented today     Data Reviewed:  I have personally reviewed following labs and imaging studies  Micro Results Recent Results (from the past 240 hour(s))  SARS CORONAVIRUS 2 (TAT 6-24 HRS) Nasopharyngeal Nasopharyngeal Swab     Status: None   Collection Time: 06/09/2020  1:40 AM   Specimen: Nasopharyngeal Swab  Result Value Ref Range Status   SARS Coronavirus 2 NEGATIVE NEGATIVE Final    Comment: (NOTE) SARS-CoV-2 target nucleic acids are NOT DETECTED.  The SARS-CoV-2 RNA is generally detectable in upper and lower respiratory specimens during the acute phase of infection. Negative results Brooks not preclude SARS-CoV-2 infection, Brooks not rule out co-infections with other pathogens, and should not be used as the sole basis for treatment or other patient management decisions. Negative results must be combined with clinical observations, patient history, and epidemiological information. The expected result is Negative.  Fact Sheet for Patients: HairSlick.no  Fact Sheet for Healthcare Providers: quierodirigir.com  This test is not yet approved or cleared by the [place] FDA and  has been authorized for detection and/or diagnosis of SARS-CoV-2 by FDA under an Emergency Use Authorization (EUA). This EUA will remain  in effect (meaning this test can be used) for the duration of the COVID-19 declaration under Se ction 564(b)(1) of the Act, 21  U.S.C. section 360bbb-3(b)(1), unless the authorization is terminated or revoked sooner.  Performed at [facility], [address], [place], [place] [address]   MRSA PCR Screening     Status: None   Collection Time: 06/15/2020  4:03 PM   Specimen: Nasopharyngeal  Result Value Ref Range Status   MRSA by PCR NEGATIVE NEGATIVE Final    Comment:        The GeneXpert MRSA Assay (FDA approved for NASAL specimens only), is one component of a comprehensive MRSA colonization surveillance program. It is not intended to diagnose MRSA infection nor to guide or monitor treatment for MRSA infections. Performed at [facility], [address] [place], [place] [address]     Radiology Reports CT HEAD WO CONTRAST  Result Date: 06/10/2020 CLINICAL DATA:  Mental status change.  Recent fall. EXAM: CT HEAD WITHOUT CONTRAST TECHNIQUE: Contiguous axial images were obtained from the base of the skull through the vertex without intravenous contrast. COMPARISON:  CT head 06/11/2020 FINDINGS: Brain: Generalized atrophy. Moderate white matter changes most consistent with chronic ischemia. 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 Negative for acute infarct, hemorrhage, mass. Vascular: Negative for hyperdense vessel. Atherosclerotic calcification in the carotid and vertebral arteries bilaterally. Skull: Negative Sinuses/Orbits: Negative Other: None IMPRESSION: Atrophy and chronic microvascular ischemia. No acute abnormality no change from yesterday. Electronically Signed   By: Marlan Palau [name]   On: 06/11/2020 17:49   CT Head Wo Contrast  Result Date: 07/06/2020 CLINICAL DATA:  History of multiple falls today EXAM: CT HEAD WITHOUT CONTRAST TECHNIQUE: Contiguous axial images were obtained from the base of the skull through the vertex without intravenous contrast. COMPARISON:  03/29/2017 FINDINGS: Brain: No evidence of acute infarction, hemorrhage,  hydrocephalus, extra-axial collection or mass lesion/mass effect. Chronic atrophic and ischemic changes are noted. Vascular: No hyperdense vessel or unexpected calcification. Skull: Normal. Negative for fracture or focal lesion. Sinuses/Orbits: No acute finding. Other: None. IMPRESSION: Chronic atrophic and ischemic changes  without acute abnormality. Electronically Signed   By: Mark  Lukens [name]   On: Alcide Clever12-31-2022 01:44   DG CHEST PORT 1 VIEW  Result Date: 07/06/2020 CLINICAL DATA:  Dyspnea EXAM: PORTABLE CHEST 1 VIEW COMPARISON:  07/01/2020 FINDINGS: Postop CABG. Heart size upper normal. Vascular congestion and bilateral airspace disease likely due to edema. Small right pleural effusion and right lower lobe atelectasis unchanged. Improved aeration in the left lung base. IMPRESSION: Congestive heart failure. Mild edema and small right effusion. Improved aeration in the left lung base otherwise little interval change from earlier today. Electronically Signed   By: Marlan Palauharles  Clark [name]   On: 06/29/2020 13:55   DG CHEST PORT 1 VIEW  Result Date: 06/24/2020 CLINICAL DATA:  Dyspnea EXAM: PORTABLE CHEST 1 VIEW COMPARISON:  01/21/2011 FINDINGS: The lungs are symmetrically well expanded. Moderate right pleural effusion. Bibasilar pulmonary infiltrates are present most in keeping with mild interstitial pulmonary edema. No pneumothorax. Coronary artery bypass grafting has been performed. Cardiac size is within normal limits. No acute bone abnormality. IMPRESSION: Mild cardiogenic failure.  Moderate right pleural effusion. Electronically Signed   By: Helyn NumbersAshesh  Parikh Brooks   On: 06/27/2020 01:44   PCV CAROTID DUPLEX (BILATERAL)  Result Date: 05/26/2020 Carotid artery duplex 05/20/2020: Stenosis in the right internal carotid artery (50-69%). Stenosis in the right external carotid artery (<50%). Stenosis in the left external carotid artery (<50%). Left carotid endarterectomy site widely patent. Antegrade right vertebral artery  flow. Antegrade left vertebral artery flow. Follow up in six months is appropriate if clinically indicated. No significant change from 10/18/2019.   Lab Data:  CBC: Recent Labs  Lab 10/24/2020 0030 10/24/2020 0550 10/24/2020 1124 06/14/2020 0251  WBC 6.5 5.2  --  6.0  NEUTROABS 4.4  --   --   --   HGB 7.2* 7.6* 8.2* 10.0*  HCT 23.2* 24.6*  --  31.3*  MCV 97.9 98.0  --  93.2  PLT 207 165  --  188   Basic Metabolic Panel: Recent Labs  Lab 10/24/2020 0030 10/24/2020 0139 10/24/2020 0550 07/03/2020 0457 06/22/2020 0251  NA 134*  --  136 138 139  K 5.5* 5.5* 4.7 4.6 3.8  CL 100  --  104 103 98  CO2 24  --  23 27 31   GLUCOSE 107*  --  96 96 94  BUN 55*  --  51* 40* 34*  CREATININE 2.22*  --  1.90* 1.65* 1.54*  CALCIUM 9.1  --  8.4* 9.3 9.2   GFR: Estimated Creatinine Clearance: 40.8 mL/min (A) (by C-G formula based on SCr of 1.54 mg/dL (H)). Liver Function Tests: Recent Labs  Lab 10/24/2020 0030 06/27/2020 0457  AST 18 17  ALT 12 11  ALKPHOS 38 39  BILITOT 0.9 0.8  PROT 7.3 7.3  ALBUMIN 3.8 3.7   No results for input(s): LIPASE, AMYLASE in the last 168 hours. Recent Labs  Lab 06/14/2020 1425  AMMONIA 14   Coagulation Profile: Recent Labs  Lab 10/24/2020 0027  INR 1.2   Cardiac Enzymes: No results for input(s): CKTOTAL, CKMB, CKMBINDEX, TROPONINI in the last 168 hours. BNP (last 3 results) No results for input(s): PROBNP in the last 8760 hours. HbA1C: No results for input(s): HGBA1C in the last 72 hours. CBG: Recent Labs  Lab 07/08/2020 1639 06/22/2020 2041 06/25/2020 2129 07/09/2020 0735 07/06/2020 1206  GLUCAP 99 77 83 102* 82   Lipid Profile: No results for input(s): CHOL, HDL, LDLCALC, TRIG, CHOLHDL, LDLDIRECT in the last 72 hours.  Thyroid Function Tests: No results for input(s): TSH, T4TOTAL, FREET4, T3FREE, THYROIDAB in the last 72 hours. Anemia Panel: No results for input(s): VITAMINB12, FOLATE, FERRITIN, TIBC, IRON, RETICCTPCT in the last 72 hours. Urine analysis:     Component Value Date/Time   COLORURINE AMBER (A) 06-28-20 0550   APPEARANCEUR HAZY (A) June 28, 2020 0550   LABSPEC 1.020 06/28/20 0550   PHURINE 5.0 2020-06-28 0550   GLUCOSEU NEGATIVE 06/28/2020 0550   HGBUR NEGATIVE 06/28/2020 0550   BILIRUBINUR NEGATIVE Jun 28, 2020 0550   KETONESUR 5 (A) 2020-06-28 0550   PROTEINUR 30 (A) 28-Jun-2020 0550   NITRITE NEGATIVE June 28, 2020 0550   LEUKOCYTESUR NEGATIVE 06-28-2020 0550     Ripudeep Rai [name] [facility] 06/14/2020, 2:56 PM  Available via Epic secure chat 7am-7pm After 7 pm, please refer to night coverage provider listed on amion.

## 2020-06-18 NOTE — Evaluation (Signed)
Physical Therapy Evaluation Patient Details Name: [name] MRN: [id number] DOB: Dec 24, 1945 Today's Date: 06/20/2020   History of Present Illness  Patient is 75 y.o. male admitted for concerns of acute GI bleed, weakness, and falls. PMH significant for stroke, MI, HTN, DM, dementia, CAD, CKD, OA, depression, CABG [id number].    Clinical Impression  [name] is 75 y.o. male admitted with above HPI and diagnosis. Patient is currently limited by functional impairments below (see PT problem list). Patient lives with his wife however chart review indicates she was recently placed in hospice care. He has assist from caregiver intermittently for homemaking and some ADL's, and is modified independent with rollator for short household mobility at baseline. Patient will benefit from continued skilled PT interventions to address impairments and progress independence with mobility, recommending SNF follow up at this time; if pt progresses and can obtain 24/7 assist at home he may be able to return home. Patient is agreeable to ST at SNF to improve safety before returning home. Acute PT will follow and progress as able.     Follow Up Recommendations SNF (SNF vs 24/7 assist and HHPT; pt is currently agreeable to SNF for rehab)    Equipment Recommendations  None recommended by PT    Recommendations for Other Services       Precautions / Restrictions Precautions Precautions: Fall Restrictions Weight Bearing Restrictions: No      Mobility  Bed Mobility Overal bed mobility: Needs Assistance Bed Mobility: Supine to Sit;Sit to Supine     Supine to sit: Min assist;HOB elevated Sit to supine: Min assist   General bed mobility comments: patient required cues to reach to bed rail and bring LE's off EOB. assist needed to press up and raise trunk. Cues to scoot foward, pt able to use bil UE's with no assist for this. Patient able to initiate bringing bil LE's back onto bed, min assist to fully complete  transfer.    Transfers Overall transfer level: Needs assistance Equipment used: 4-wheeled walker Transfers: Sit to/from [employer] Sit to Stand: Mod assist;Min assist Stand pivot transfers: Min assist       General transfer comment: cues for hand placement for power up, pt using bil UE's from EOB and armrest of recliner. Mod assist to rise from EOB initially and min assist for 2 stands from recliner. min assist for stand step from chair>bed at EOS as transport arrived to take pt to EGD.  Ambulation/Gait Ambulation/Gait assistance: Min assist Gait Distance (Feet): 50 Feet (10, 2x20) Assistive device: 4-wheeled walker Gait Pattern/deviations: Step-through pattern;Decreased step length - right;Decreased step length - left;Decreased stride length;Shuffle;Trunk flexed Gait velocity: decr   General Gait Details: pt required cues and min assist for safe positioning of rollator and to steady during gait. He fatigued quickly and ambulated ~10' and 2x20' with seated rest. Pt HR in 60's at rest and 70-80's with gait. SpO2 >91% on 3L/min.  Stairs            Wheelchair Mobility    Modified Rankin (Stroke Patients Only)       Balance Overall balance assessment: Needs assistance Sitting-balance support: Feet supported Sitting balance-Leahy Scale: Fair     Standing balance support: During functional activity;Bilateral upper extremity supported Standing balance-Leahy Scale: Poor                               Pertinent Vitals/Pain Pain Assessment: No/denies pain  Home Living Family/patient expects to be discharged to:: Private residence Living Arrangements: Spouse/significant other (wife recently put into hospice care) Available Help at Discharge: Family;Friend(s) Type of Home: Apartment Home Access: Level entry;Elevator;Stairs to enter     Home Layout: One level Home Equipment: Walker - 4 wheels;Shower seat;Hand held shower head      Prior  Function Level of Independence: Needs assistance   Gait / Transfers Assistance Needed: ambulates with rollator     Comments: pt reports he has a caregiver who assists him as needed. "Dorene Sorrow". pt reports he needs help with home making, preparing meals, grocery shopping, but that he typically mobilizes with rollator independently and dresses himself. He has not been bathing in shower due to fear of falling.     Hand Dominance        Extremity/Trunk Assessment   Upper Extremity Assessment Upper Extremity Assessment: Generalized weakness    Lower Extremity Assessment Lower Extremity Assessment: Generalized weakness    Cervical / Trunk Assessment Cervical / Trunk Assessment: Kyphotic  Communication   Communication: 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)  Cognition Arousal/Alertness: Awake/alert Behavior During Therapy: WFL for tasks assessed/performed Overall Cognitive Status: Within Functional Limits for tasks assessed                                 General Comments: pt pleasant and A&O to self, place, situation. He anwers all questions appropriately and follows cues with extra time, this may be due to difficulty hearing.      General Comments General comments (skin integrity, edema, etc.): ICU telemetry on throughout mobility. Pt with several pvc's during gait. Noted alert for STEIII elevation for ~3 seconds while pt sitting in recliner that then went away. Pt denied dizziness, CP, SOB, nasuea. RN notified and further gait deferred.    Exercises     Assessment/Plan    PT Assessment Patient needs continued PT services  PT Problem List Decreased strength;Decreased activity tolerance;Decreased balance;Decreased mobility;Decreased coordination;Decreased cognition;Decreased knowledge of use of DME;Decreased safety awareness;Cardiopulmonary status limiting activity       PT Treatment Interventions DME instruction;Gait training;Functional mobility training;Therapeutic  activities;Therapeutic exercise;Balance training;Neuromuscular re-education;Patient/family education    PT Goals (Current goals can be found in the Care Plan section)  Acute Rehab PT Goals Patient Stated Goal: improve strength and independence to be able to shower again PT Goal Formulation: With patient Time For Goal Achievement: 07/02/20 Potential to Achieve Goals: Good    Frequency Min 3X/week   Barriers to discharge        Co-evaluation               AM-PAC PT "6 Clicks" Mobility  Outcome Measure Help needed turning from your back to your side while in a flat bed without using bedrails?: A Little Help needed moving from lying on your back to sitting on the side of a flat bed without using bedrails?: A Little Help needed moving to and from a bed to a chair (including a wheelchair)?: A Little Help needed standing up from a chair using your arms (e.g., wheelchair or bedside chair)?: A Little Help needed to walk in hospital room?: A Little Help needed climbing 3-5 steps with a railing? : A Lot 6 Click Score: 17    End of Session Equipment Utilized During Treatment: Gait belt;Oxygen Activity Tolerance: Patient tolerated treatment well Patient left: in bed;with family/visitor present;Other (comment) (transport to take pt to EGD) Nurse Communication:  Mobility status PT Visit Diagnosis: Muscle weakness (generalized) (M62.81);Difficulty in walking, not elsewhere classified (R26.2);Unsteadiness on feet (R26.81)    Time: 7353-2992 PT Time Calculation (min) (ACUTE ONLY): 45 min   Charges:   PT Evaluation $PT Eval Moderate Complexity: 1 Mod PT Treatments $Gait Training: 8-22 mins $Therapeutic Activity: 8-22 mins        Wynn Maudlin, DPT Acute Rehabilitation Services Office [phone] Pager [phone]    Anitra Lauth 07/03/2020, 2:46 PM

## 2020-06-19 ENCOUNTER — Encounter (HOSPITAL_COMMUNITY): Payer: Self-pay | Admitting: Gastroenterology

## 2020-06-19 ENCOUNTER — Inpatient Hospital Stay (HOSPITAL_COMMUNITY): Payer: Medicare PPO

## 2020-06-19 DIAGNOSIS — J9622 Acute and chronic respiratory failure with hypercapnia: Secondary | ICD-10-CM | POA: Diagnosis not present

## 2020-06-19 DIAGNOSIS — K922 Gastrointestinal hemorrhage, unspecified: Secondary | ICD-10-CM | POA: Diagnosis not present

## 2020-06-19 DIAGNOSIS — N179 Acute kidney failure, unspecified: Secondary | ICD-10-CM | POA: Diagnosis not present

## 2020-06-19 DIAGNOSIS — I5033 Acute on chronic diastolic (congestive) heart failure: Secondary | ICD-10-CM | POA: Diagnosis not present

## 2020-06-19 LAB — BASIC METABOLIC PANEL
Anion gap: 13 (ref 5–15)
BUN: 32 mg/dL — ABNORMAL HIGH (ref 8–23)
CO2: 32 mmol/L (ref 22–32)
Calcium: 9.1 mg/dL (ref 8.9–10.3)
Chloride: 94 mmol/L — ABNORMAL LOW (ref 98–111)
Creatinine, Ser: 1.44 mg/dL — ABNORMAL HIGH (ref 0.61–1.24)
GFR, Estimated: 50 mL/min — ABNORMAL LOW (ref >60)
Glucose, Bld: 97 mg/dL (ref 70–99)
Potassium: 4 mmol/L (ref 3.5–5.1)
Sodium: 139 mmol/L (ref 135–145)

## 2020-06-19 LAB — CBC
HCT: 33.4 % — ABNORMAL LOW (ref 39.0–52.0)
Hemoglobin: 10.8 g/dL — ABNORMAL LOW (ref 13.0–17.0)
MCH: 30.4 pg (ref 26.0–34.0)
MCHC: 32.3 g/dL (ref 30.0–36.0)
MCV: 94.1 fL (ref 80.0–100.0)
Platelets: 180 10*3/uL (ref 150–400)
RBC: 3.55 MIL/uL — ABNORMAL LOW (ref 4.22–5.81)
RDW: 15.2 % (ref 11.5–15.5)
WBC: 5.7 10*3/uL (ref 4.0–10.5)
nRBC: 0 % (ref 0.0–0.2)

## 2020-06-19 LAB — TYPE AND SCREEN
ABO/RH(D): A POS
Antibody Screen: NEGATIVE
Unit division: 0
Unit division: 0
Unit division: 0

## 2020-06-19 LAB — BPAM RBC
Blood Product Expiration Date: 202204022359
Blood Product Expiration Date: 202204022359
Blood Product Expiration Date: 202204022359
ISSUE DATE / TIME: 202203080307
ISSUE DATE / TIME: 202203080753
ISSUE DATE / TIME: 202203081453
Unit Type and Rh: 6200
Unit Type and Rh: 6200
Unit Type and Rh: 6200

## 2020-06-19 LAB — ECHOCARDIOGRAM COMPLETE
AR max vel: 0.88 cm2
AV Area VTI: 0.9 cm2
AV Area mean vel: 0.91 cm2
AV Mean grad: 23.5 mmHg
AV Peak grad: 42 mmHg
Ao pk vel: 3.24 m/s
Area-P 1/2: 3.72 cm2
S' Lateral: 4.15 cm
Weight: 2719.59 oz

## 2020-06-19 LAB — TROPONIN I (HIGH SENSITIVITY)
Troponin I (High Sensitivity): 60 ng/L — ABNORMAL HIGH (ref <18)
Troponin I (High Sensitivity): 93 ng/L — ABNORMAL HIGH (ref <18)
Troponin I (High Sensitivity): 151 ng/L (ref <18)
Troponin I (High Sensitivity): 141 ng/L (ref <18)

## 2020-06-19 LAB — SURGICAL PATHOLOGY

## 2020-06-19 LAB — GLUCOSE, CAPILLARY: Glucose-Capillary: 101 mg/dL — ABNORMAL HIGH (ref 70–99)

## 2020-06-19 LAB — BRAIN NATRIURETIC PEPTIDE: B Natriuretic Peptide: 438.4 pg/mL — ABNORMAL HIGH (ref 0.0–100.0)

## 2020-06-19 MED ORDER — METOPROLOL TARTRATE 25 MG PO TABS
25.0000 mg | ORAL_TABLET | Freq: Three times a day (TID) | ORAL | Status: DC
  Administered 2020-06-19 – 2020-06-20 (×5): 25 mg via ORAL
  Filled 2020-06-19 (×5): qty 1

## 2020-06-19 MED ORDER — NITROGLYCERIN 0.4 MG SL SUBL
SUBLINGUAL_TABLET | SUBLINGUAL | Status: AC
  Filled 2020-06-19: qty 1

## 2020-06-19 MED ORDER — FUROSEMIDE 40 MG PO TABS
40.0000 mg | ORAL_TABLET | Freq: Every day | ORAL | Status: DC
  Administered 2020-06-20: 40 mg via ORAL
  Filled 2020-06-19: qty 1

## 2020-06-19 MED ORDER — NITROGLYCERIN 0.4 MG SL SUBL
SUBLINGUAL_TABLET | SUBLINGUAL | Status: AC
  Administered 2020-06-19: 0.4 mg
  Filled 2020-06-19: qty 1

## 2020-06-19 MED ORDER — AMIODARONE HCL 200 MG PO TABS
400.0000 mg | ORAL_TABLET | Freq: Three times a day (TID) | ORAL | Status: DC
  Administered 2020-06-19 – 2020-06-20 (×3): 400 mg via ORAL
  Filled 2020-06-19 (×4): qty 2

## 2020-06-19 MED ORDER — DILTIAZEM HCL-DEXTROSE 125-5 MG/125ML-% IV SOLN (PREMIX)
5.0000 mg/h | INTRAVENOUS | Status: DC
  Administered 2020-06-19: 5 mg/h via INTRAVENOUS
  Filled 2020-06-19: qty 125

## 2020-06-19 MED ORDER — DILTIAZEM HCL 25 MG/5ML IV SOLN
10.0000 mg | Freq: Once | INTRAVENOUS | Status: AC
  Administered 2020-06-19: 10 mg via INTRAVENOUS
  Filled 2020-06-19: qty 5

## 2020-06-19 MED ORDER — DIGOXIN 0.25 MG/ML IJ SOLN
0.2500 mg | Freq: Once | INTRAMUSCULAR | Status: AC
  Administered 2020-06-19: 0.25 mg via INTRAVENOUS
  Filled 2020-06-19: qty 2

## 2020-06-19 MED ORDER — PERFLUTREN LIPID MICROSPHERE
1.0000 mL | INTRAVENOUS | Status: AC | PRN
  Administered 2020-06-19: 2 mL via INTRAVENOUS
  Filled 2020-06-19: qty 10

## 2020-06-19 NOTE — Anesthesia Postprocedure Evaluation (Signed)
Anesthesia Post Note  Patient: Galen Manila  Procedure(s) Performed: ESOPHAGOGASTRODUODENOSCOPY (EGD) WITH PROPOFOL (N/A ) BIOPSY     Patient location during evaluation: PACU Anesthesia Type: MAC Level of consciousness: awake and alert Pain management: pain level controlled Vital Signs Assessment: post-procedure vital signs reviewed and stable Respiratory status: spontaneous breathing, nonlabored ventilation, respiratory function stable and patient connected to nasal cannula oxygen Cardiovascular status: stable and blood pressure returned to baseline Postop Assessment: no apparent nausea or vomiting Anesthetic complications: no   No complications documented.  Last Vitals:  Vitals:   06/19/20 0600 06/19/20 0800  BP: (!) 153/41   Pulse: 77   Resp: (!) 23   Temp:  36.6 C  SpO2: 98%     Last Pain:  Vitals:   06/19/20 0800  TempSrc: Oral  PainSc:                  Lakyn Alsteen S

## 2020-06-19 NOTE — Progress Notes (Signed)
[facility]                                                                              Patient Demographics  [name], is a 75 y.o. male, DOB - Dec 15, 1945, SWN:[id number]  Admit date - 07/03/2020   Admitting Physician Teddy Spike, DO  Outpatient Primary MD for the patient is [name], MD  Outpatient specialists:   LOS - 2  days   Medical records reviewed and are as summarized below:    Chief Complaint  Patient presents with  . Fall  . Abnormal Lab       Brief summary   Patient is a 75 year old male with history of hypertension, hyperlipidemia, CAD, obstructive sleep apnea presented with generalized weakness and falls.  Patient was noted to be somewhat confused, his friend reported that he has been generally weak and had falls for the last 4 days.  He had hit his head at least once but no loss of consciousness or syncopal episodes.  Hemoglobin was found to be 7 and significant drop from his previous readings.  Patient was sent to ED by PCP. Hemoglobin 7.2, was transfused 2 units packed RBCs.  GI was consulted.    Assessment & Plan    Principal Problem:   Acute upper GI bleed with underlying history of GERD -Patient noted to have hemoglobin of 7.2 on admission, previously 13.2 on 06/26/2019 -Patient was placed on IV Protonix, transfused 2 units packed RBCs -GI was consulted, underwent endoscopy 3/10, normal esophagus, erosive gastropathy with no stigmata of bleeding, erythematous duodenopathy.  Recommended colonoscopy however monitor hemoglobin for now, if declined significantly then will pursue colonoscopy. -H&H currently stable, 10.8  Active Problems:   Acute on chronic respiratory failure with hypercapnia (HCC) with acute metabolic encephalopathy -Overnight on 3/9, patient was noted to be in fluid overload, had received packed RBC transfusion.  In the morning was lethargic, tachypneic and short of breath.  Was placed on 5 L O2 via [place], fluid  overload on the chest x-ray.  BNP 1030 -ABG showed PCO2 65.7, pH 7.2.  Per patient's wife, used to be heavy smoker and has history of OSA, had refused CPAP but was using 2 L O2 at night.  -Now on BiPAP nightly, alert and oriented this morning, Lasix held due to Cardizem and soft BP -Respiratory status improving, O2 sats 97% on 3 L  Atrial fibrillation with RVR, new onset -Patient noted to be in rapid atrial fibrillation today, heart rate 96-130s, confirmed with EKG -Given Cardizem 10 mg IV x1, placed on Cardizem drip, digoxin IV 0.25 mg x 1 -BP soft, will need rhythm control as cannot be placed on anticoagulation due to GI bleed. CHA2DVASc 6. -Appreciate cardiology recommendations, seen by Dr. Jacinto Halim, started on amiodarone. -EKG suggesting NSTEMI likely due to A. fib with RVR    Acute on chronic diastolic CHF (congestive heart failure) (HCC) -Likely due to fluid overload and packed RBC transfusion.  BNP 1030, patient has a history of diastolic CHF -Previous echo on 06/02/2018 had shown EF of 50 to 55%, normal systolic function, repeat echo pending -will hold off on IV  Lasix due to soft BP, transition to oral Lasix, beta-blocker   Acute metabolic encephalopathy -Possibly due to hypercapnia, OSA not on CPAP, Xanax.  Resolved - currently alert and oriented, at baseline -CT head on admission showed no intracranial bleed.  Repeat CT head showed no changes.    CAD (coronary artery disease) of artery bypass graft -Currently improving, no chest pain, no acute shortness of breath.   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 (HCC) -Baseline creatinine 1.4 -Presented with creatinine of 2.2 on admission, continue to hold losartan -Creatinine improving, 1.4 at baseline now     COPD (chronic obstructive pulmonary disease) (HCC), obstructive sleep apnea - Likely has underlying emphysema due to remote heavy smoking, has  been diagnosed with OSA, not consistently using CPAP -May benefit from CPAP nightly however patient has declined in the past per his wife   Generalized weakness, falls -PT OT evaluation   Code Status: Full CODE STATUS DVT Prophylaxis:  SCDs Start: 06/27/2020 0222   Level of Care: Level of care: Stepdown Family Communication: Discussed all imaging results, lab results, management explained to the patient's wife on the phone on 3/9 (phone number [phone]).  Left voicemail on 3/10, updated on phone today.      Disposition Plan:     Status is: Inpatient  Remains inpatient appropriate because:Inpatient level of care appropriate due to severity of illness   Dispo: The patient is from: Home              Anticipated d/c is to: TBD              Patient currently is not medically stable to d/c. Now in Afib with RVR.    Difficult to place patient No    Time Spent in minutes 35 minutes  Procedures:  Echo  Consultants:   Gastroenterology Cardiology   Antimicrobials:   Anti-infectives (From admission, onward)   None         Medications  Scheduled Meds: . atorvastatin  20 mg Oral Daily  . chlorhexidine  15 mL Mouth Rinse BID  . Chlorhexidine Gluconate Cloth  6 each Topical Daily  . DULoxetine  60 mg Oral QHS  . ezetimibe  10 mg Oral QPC supper  . furosemide  40 mg Intravenous Q12H  . mouth rinse  15 mL Mouth Rinse q12n4p  . nitroGLYCERIN      . pantoprazole  40 mg Intravenous Q12H   Continuous Infusions: . diltiazem (CARDIZEM) infusion     PRN Meds:.hydrALAZINE, LORazepam, ondansetron **OR** ondansetron (ZOFRAN) IV      Subjective:   [name] was seen and examined today.  Patient seen twice today, in the morning alert and oriented, no acute complaints, slept okay.  Subsequently later went into atrial fibrillation with RVR heart rate in 130s.  Overnight did have the BiPAP for 4 hours. Evaluated again, no chest pain or acute shortness of breath or  dizziness.  Gave Cardizem 10 mg IV x1 and started on drip.  Objective:   Vitals:   06/19/20 0400 06/19/20 0500 06/19/20 0600 06/19/20 0800  BP: (!) 138/49 (!) 141/49 (!) 153/41   Pulse: 68 71 77   Resp: 16 20 (!) 23   Temp:    97.9 F (36.6 C)  TempSrc:    Oral  SpO2: 98% 97% 98%   Weight:  77.1 kg      Intake/Output Summary (Last  24 hours) at 06/19/2020 0933 Last data filed at 06/19/2020 0300 Gross per 24 hour  Intake 300 ml  Output 1000 ml  Net -700 ml     Wt Readings from Last 3 Encounters:  06/19/20 77.1 kg  06/03/20 83 kg  03/23/20 83.9 kg   Physical Exam  General: Alert and oriented x 3, NAD  Cardiovascular: S1 S2 clear, irregularly irregular, tachycardia  Respiratory: Decreased breath sound at the bases, improving  Gastrointestinal: Soft, nontender, nondistended, NBS  Ext: no pedal edema bilaterally  Neuro: no new deficits  Musculoskeletal: No cyanosis, clubbing  Skin: No rashes  Psych: Normal affect and demeanor, alert and oriented x3    Data Reviewed:  I have personally reviewed following labs and imaging studies  Micro Results Recent Results (from the past 240 hour(s))  SARS CORONAVIRUS 2 (TAT 6-24 HRS) Nasopharyngeal Nasopharyngeal Swab     Status: None   Collection Time: 06/09/2020  1:40 AM   Specimen: Nasopharyngeal Swab  Result Value Ref Range Status   SARS Coronavirus 2 NEGATIVE NEGATIVE Final    Comment: (NOTE) SARS-CoV-2 target nucleic acids are NOT DETECTED.  The SARS-CoV-2 RNA is generally detectable in upper and lower respiratory specimens during the acute phase of infection. Negative results do not preclude SARS-CoV-2 infection, do not rule out co-infections with other pathogens, and should not be used as the sole basis for treatment or other patient management decisions. Negative results must be combined with clinical observations, patient history, and epidemiological information. The expected result is Negative.  Fact Sheet  for Patients: HairSlick.no  Fact Sheet for Healthcare Providers: quierodirigir.com  This test is not yet approved or cleared by the [place] FDA and  has been authorized for detection and/or diagnosis of SARS-CoV-2 by FDA under an Emergency Use Authorization (EUA). This EUA will remain  in effect (meaning this test can be used) for the duration of the COVID-19 declaration under Se ction 564(b)(1) of the Act, 21 U.S.C. section 360bbb-3(b)(1), unless the authorization is terminated or revoked sooner.  Performed at [facility], [address] [place], [place] [address]   MRSA PCR Screening     Status: None   Collection Time: July 09, 2020  4:03 PM   Specimen: Nasopharyngeal  Result Value Ref Range Status   MRSA by PCR NEGATIVE NEGATIVE Final    Comment:        The GeneXpert MRSA Assay (FDA approved for NASAL specimens only), is one component of a comprehensive MRSA colonization surveillance program. It is not intended to diagnose MRSA infection nor to guide or monitor treatment for MRSA infections. Performed at [facility], [address] [place], [place] [address]     Radiology Reports CT HEAD WO CONTRAST  Result Date: 09-Jul-2020 CLINICAL DATA:  Mental status change.  Recent fall. EXAM: CT HEAD WITHOUT CONTRAST TECHNIQUE: Contiguous axial images were obtained from the base of the skull through the vertex without intravenous contrast. COMPARISON:  CT head 06/20/2020 FINDINGS: Brain: Generalized atrophy. Moderate white matter changes most consistent with chronic ischemia. 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 Negative for acute infarct, hemorrhage, mass. Vascular: Negative for hyperdense vessel. Atherosclerotic calcification in the carotid and vertebral arteries bilaterally. Skull: Negative Sinuses/Orbits: Negative Other: None IMPRESSION: Atrophy and chronic  microvascular ischemia. No acute abnormality no change from yesterday. Electronically Signed   By: Marlan Palau [name]   On: 2020/07/09 17:49   CT Head Wo Contrast  Result Date: 07/09/2020 CLINICAL  DATA:  History of multiple falls today EXAM: CT HEAD WITHOUT CONTRAST TECHNIQUE: Contiguous axial images were obtained from the base of the skull through the vertex without intravenous contrast. COMPARISON:  03/29/2017 FINDINGS: Brain: No evidence of acute infarction, hemorrhage, hydrocephalus, extra-axial collection or mass lesion/mass effect. Chronic atrophic and ischemic changes are noted. Vascular: No hyperdense vessel or unexpected calcification. Skull: Normal. Negative for fracture or focal lesion. Sinuses/Orbits: No acute finding. Other: None. IMPRESSION: Chronic atrophic and ischemic changes without acute abnormality. Electronically Signed   By: Alcide CleverMark  Lukens [name]   On: 06/15/2020 01:44   DG CHEST PORT 1 VIEW  Result Date: 06/20/2020 CLINICAL DATA:  Dyspnea EXAM: PORTABLE CHEST 1 VIEW COMPARISON:  06/23/2020 FINDINGS: Postop CABG. Heart size upper normal. Vascular congestion and bilateral airspace disease likely due to edema. Small right pleural effusion and right lower lobe atelectasis unchanged. Improved aeration in the left lung base. IMPRESSION: Congestive heart failure. Mild edema and small right effusion. Improved aeration in the left lung base otherwise little interval change from earlier today. Electronically Signed   By: Marlan Palauharles  Clark [name]   On: 06/10/2020 13:55   DG CHEST PORT 1 VIEW  Result Date: 06/09/2020 CLINICAL DATA:  Dyspnea EXAM: PORTABLE CHEST 1 VIEW COMPARISON:  01/21/2011 FINDINGS: The lungs are symmetrically well expanded. Moderate right pleural effusion. Bibasilar pulmonary infiltrates are present most in keeping with mild interstitial pulmonary edema. No pneumothorax. Coronary artery bypass grafting has been performed. Cardiac size is within normal limits. No acute bone  abnormality. IMPRESSION: Mild cardiogenic failure.  Moderate right pleural effusion. 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 Stenosis in the right external carotid artery (<50%). Stenosis in the left external carotid artery (<50%). Left carotid endarterectomy site widely patent. Antegrade right vertebral artery flow. Antegrade left vertebral artery flow. Follow up in six months is appropriate if clinically indicated. No significant change from 10/18/2019.   Lab Data:  CBC: Recent Labs  Lab 07/03/2020 0030 07/05/2020 0550 07/07/2020 1124 07/02/2020 0251 06/19/20 0220  WBC 6.5 5.2  --  6.0 5.7  NEUTROABS 4.4  --   --   --   --   HGB 7.2* 7.6* 8.2* 10.0* 10.8*  HCT 23.2* 24.6*  --  31.3* 33.4*  MCV 97.9 98.0  --  93.2 94.1  PLT 207 165  --  188 180   Basic Metabolic Panel: Recent Labs  Lab 06/10/2020 0030 07/01/2020 0139 06/29/2020 0550 06/25/2020 0457 07/05/2020 0251 06/19/20 0220  NA 134*  --  136 138 139 139  K 5.5* 5.5* 4.7 4.6 3.8 4.0  CL 100  --  104 103 98 94*  CO2 24  --  23 27 31  32  GLUCOSE 107*  --  96 96 94 97  BUN 55*  --  51* 40* 34* 32*  CREATININE 2.22*  --  1.90* 1.65* 1.54* 1.44*  CALCIUM 9.1  --  8.4* 9.3 9.2 9.1   GFR: Estimated Creatinine Clearance: 43.6 mL/min (A) (by C-G formula based on SCr of 1.44 mg/dL (H)). Liver Function Tests: Recent Labs  Lab 06/11/2020 0030 07/01/2020 0457  AST 18 17  ALT 12 11  ALKPHOS 38 39  BILITOT 0.9 0.8  PROT 7.3 7.3  ALBUMIN 3.8 3.7   No results for input(s): LIPASE, AMYLASE in the last 168 hours. Recent Labs  Lab 07/06/2020 1425  AMMONIA 14   Coagulation Profile: Recent Labs  Lab 20-Jun-2020 0027  INR 1.2   Cardiac Enzymes: No results for input(s): CKTOTAL, CKMB, CKMBINDEX, TROPONINI in the last 168 hours. BNP (last 3 results) No results for  input(s): PROBNP in the last 8760 hours. HbA1C: No results for input(s): HGBA1C in the last 72 hours. CBG: Recent Labs  Lab 06/13/2020 0735 07/03/2020 1206 07/04/2020 1630 06/22/2020 2124 06/19/20 0759  GLUCAP 102* 82 93 120* 101*   Lipid Profile: No results for input(s): CHOL, HDL, LDLCALC, TRIG, CHOLHDL, LDLDIRECT in the last 72 hours. Thyroid Function Tests: No results for input(s): TSH, T4TOTAL, FREET4, T3FREE, THYROIDAB in the last 72 hours. Anemia Panel: No results for input(s): VITAMINB12, FOLATE, FERRITIN, TIBC, IRON, RETICCTPCT in the last 72 hours. Urine analysis:    Component Value Date/Time   COLORURINE AMBER (A) 2020-06-20 0550   APPEARANCEUR HAZY (A) 06/20/2020 0550   LABSPEC 1.020 Jun 20, 2020 0550   PHURINE 5.0 06/20/2020 0550   GLUCOSEU NEGATIVE June 20, 2020 0550   HGBUR NEGATIVE 06/20/20 0550   BILIRUBINUR NEGATIVE 06-20-20 0550   KETONESUR 5 (A) 20-Jun-2020 0550   PROTEINUR 30 (A) 06-20-2020 0550   NITRITE NEGATIVE 2020/06/20 0550   LEUKOCYTESUR NEGATIVE 06-20-2020 0550     Adelin Ventrella [name] [facility] 06/19/2020, 9:33 AM  Available via Epic secure chat 7am-7pm After 7 pm, please refer to night coverage provider listed on amion.

## 2020-06-19 NOTE — Progress Notes (Signed)
  Echocardiogram 2D Echocardiogram has been performed.  [name] 06/19/2020, 12:15 PM

## 2020-06-19 NOTE — Progress Notes (Signed)
Charge RN noticed pt rhythm changed to A Fib. I was also informed by tele of rhythm change. Charge RN assesses pt. Another RN grabs sublingual nitroglycerin for pt due to chest pain. Nitroglycerin was given x2. EKG was done, rhythm A fib RVR. IV Cardizem given. Pt rhythm was unchanged. Cardizem drip started.

## 2020-06-19 NOTE — Consult Note (Signed)
CARDIOLOGY CONSULT NOTE  Patient ID: [name] MRN: [id number] DOB/AGE: 77-Oct-1947 77 y.o.  Admit date: 07/06/2020 Referring Physician  Ripu Rai, MD Primary Physician:  [name], MD Reason for Consultation  New onset A. Fib  Patient ID: [name], male    DOB: [birth year], 75 y.o.   MRN: [id number]  Chief Complaint  Patient presents with  . Fall  . Abnormal Lab   HPI:    [name]  is a 75 y.o. Caucasian male  patient with hypertension, mixed hyperlipidemia, CAD S/P CABG in 2006, mild to moderate LV systolic dysfunction, chronic angina pectoris and left carotid endarterectomy, mild right ICA stenosis and history of possible TIA in August 2018, found to have moderate intracranial disease and PAD with moderate decrease in bilateral ABI and diffuse PAD by angiogram, now on dual antiplatelet therapy.   I had last seen him just 2 to 3 weeks ago, for routine follow-up of chronic stable angina pectoris and hypertension.  Patient now admitted to the hospital with generalized weakness, frequent fall, and found to have severe anemia and received 2 units of packed RBCs. Underwent upper GI endoscopy on July 16, 2020 revealing erosive gastropathy with no stigmata for recent bleeding and erythematous duodenum.  Colonoscopy was not performed as patient was felt to be a poor candidate.  Patient was in fluid overload state and was placed on BiPAP and following stabilization of hemoglobin and mild diuresis, symptoms improved and was able to be transferred to regular floor from ICU but then developed atrial fibrillation with RVR.  I was consulted to manage atrial fibrillation.  Patient has chronic angina, that is unchanged from previous.  Has chronic dyspnea and he is very frail, has chronic leg edema as well.  Since being in the hospital his leg edema has resolved.  States that his shortness of breath is improved and he denies present chest pain or palpitations.  Past Medical History:  Diagnosis  Date  . CAD (coronary artery disease) of artery bypass graft 12/09/2013  . Chronic lower back pain   . CKD (chronic kidney disease)    stage 3a in 2019 and 2020  . Coronary artery disease   . Dementia (HCC)    "step dementia; from mini strokes in 2018" (11/08/2017)  . Depression with anxiety   . Diabetes mellitus    "totally diet controlled" (11/08/2017)  . Hearing loss    wears bilateral hearing aids  . High cholesterol   . History of kidney stones   . Hypertension   . MI (myocardial infarction) (HCC) [phone]   (several)  . On home oxygen therapy    "2L when he sleeps at night" (11/08/2017)  . Osteoarthritis of both knees    "both knees need replaced" (11/08/2017)  . Sleep apnea    "mild; uses oxygen" (11/08/2017)  . Stroke [facility]) 2018   "several silent ones; a little memory loss; step dementia" (11/08/2017)   Past Surgical History:  Procedure Laterality Date  . 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 CARDIAC CATHETERIZATION     "many"  . CAROTID ENDARTERECTOMY Left 04/2013   Additionally, required re-exploration and evacuation of hematoma 1 week post op.    . COLONOSCOPY  11/1999   Ritta Slot MD.  Avg risk screening.  non-bleeding ext hemorrhoids, o/w normal study.    . CORONARY ARTERY BYPASS GRAFT  1996   5 vessels  . CORONARY/GRAFT ANGIOGRAPHY  N/A 05/01/2018   Procedure: CORONARY/GRAFT ANGIOGRAPHY;  Surgeon: Yates Decamp, MD;  Location: [facility] INVASIVE CV LAB;  Service: Cardiovascular;  Laterality: N/A;  . KIDNEY STONE SURGERY  ~ 2014   "laparoscopic, robotic OR"  . LAPAROSCOPIC APPENDECTOMY N/A 06/03/2018   Procedure: APPENDECTOMY LAPAROSCOPIC;  Surgeon: Griselda Miner, MD;  Location: [facility] ORS;  Service: General;  Laterality: N/A;  . LOWER EXTREMITY ANGIOGRAPHY Bilateral 05/01/2018   Procedure: LOWER EXTREMITY ANGIOGRAPHY;  Surgeon: Yates Decamp, MD;  Location: [facility] INVASIVE CV LAB;  Service: Cardiovascular;  Laterality:  Bilateral;  . PERIPHERAL VASCULAR ATHERECTOMY Left 05/01/2018   Procedure: PERIPHERAL VASCULAR ATHERECTOMY;  Surgeon: Yates Decamp, MD;  Location: [facility] INVAS[facility] CV LAB;  Service: Cardiovascular;  Laterality: Left;  SFA  . PERIPHERAL VASCULAR BALLOON ANGIOPLASTY  05/01/2018   Procedure: PERIPHERAL VASCULAR BALLOON ANGIOPLASTY;  Surgeon: Yates Decamp, MD;  Location: [facility] INVASIVE CV LAB;  Service: Cardiovascular;;  . WRIST FRACTURE SURGERY Left 2001   Social History   Tobacco Use  . Smoking status: Former Smoker    Packs/day: 2.00    Years: 20.00    Pack years: 40.00    Types: Cigarettes    Quit date: 10/20/1983    Years since quitting: 36.6  . Smokeless tobacco: Never Used  Substance Use Topics  . Alcohol use: Not Currently    Family History  Problem Relation Age of Onset  . Heart disease Mother   . Heart disease Father   . Liver cancer Other   . Parkinson's disease Other   . Heart disease Brother   . Parkinsonism Sister   . Cancer Sister     Marital Sttus: Married  ROS  Review of Systems  Constitutional: Positive for malaise/fatigue.  Cardiovascular: Positive for chest pain and dyspnea on exertion. Negative for leg swelling.  Musculoskeletal: Positive for arthritis, back pain and falls.  Gastrointestinal: Negative for melena.  Neurological: Positive for disturbances in coordination.  Psychiatric/Behavioral: Positive for memory loss.  All other systems reviewed and are negative.  Objective   Vitals with BMI 06/19/2020 06/19/2020 06/19/2020  Height - - -  Weight - 170 lbs -  BMI - 25.09 -  Systolic 153 141 161  Diastolic 41 49 49  Pulse 77 71 68    Blood pressure (!) 153/41, pulse 77, temperature 97.9 F (36.6 C), temperature source Oral, resp. rate (!) 23, weight 77.1 kg, SpO2 98 %.  Vitals with BMI 06/19/2020 06/19/2020 06/19/2020  Height - - -  Weight - 170 lbs -  BMI - 25.09 -  Systolic 153 141 096  Diastolic 41 49 49  Pulse 77 71 68      Physical Exam Constitutional:       General: He is not in acute distress.    Appearance: He is ill-appearing.  HENT:     Head: Atraumatic.  Eyes:     Extraocular Movements: Extraocular movements intact.  Cardiovascular:     Rate and Rhythm: Tachycardia present. Rhythm irregular.     Pulses: Intact distal pulses.          Carotid pulses are on the right side with bruit and on the left side with bruit.      Femoral pulses are 1+ on the right side and 1+ on the left side.      Dorsalis pedis pulses are 0 on the right side and 0 on the left side.       Posterior tibial pulses are 0 on the right side and 0 on  the left side.     Heart sounds: No murmur heard. No gallop. No S3 or S4 sounds.      Comments: S1 is variable, S2 is normal. No JVD, No leg edema. Pulmonary:     Effort: Pulmonary effort is normal.     Breath sounds: Decreased breath sounds (bilateral bases. Distant lung sounds) present.  Abdominal:     General: Bowel sounds are normal.     Palpations: Abdomen is soft.  Skin:    General: Skin is warm and dry.  Neurological:     General: No focal deficit present.     Mental Status: He is alert and oriented to person, place, and time.  Psychiatric:        Mood and Affect: Mood normal.    Laboratory examination:   Recent Labs    06/26/19 1014 06/14/2020 0030 06/22/2020 0457 07/03/2020 0251 06/19/20 0220  NA 140   < > 138 139 139  K 4.7   < > 4.6 3.8 4.0  CL 103   < > 103 98 94*  CO2 24   < > 27 31 32  GLUCOSE 103*   < > 96 94 97  BUN 24   < > 40* 34* 32*  CREATININE 1.48*   < > 1.65* 1.54* 1.44*  CALCIUM 9.4   < > 9.3 9.2 9.1  GFRNONAA 46*   < > 43* 46* 50*  GFRAA 53*  --   --   --   --    < > = values in this interval not displayed.   estimated creatinine clearance is 43.6 mL/min (A) (by C-G formula based on SCr of 1.44 mg/dL (H)).  CMP Latest Ref Rng & Units 06/19/2020 06/11/2020 07/03/2020  Glucose 70 - 99 mg/dL 97 94 96  BUN 8 - 23 mg/dL 76(H) 60(V) 37(T)  Creatinine 0.61 - 1.24 mg/dL 0.62(I)  9.48(N) 4.62(V)  Sodium 135 - 145 mmol/L 139 139 138  Potassium 3.5 - 5.1 mmol/L 4.0 3.8 4.6  Chloride 98 - 111 mmol/L 94(L) 98 103  CO2 22 - 32 mmol/L 32 31 27  Calcium 8.9 - 10.3 mg/dL 9.1 9.2 9.3  Total Protein 6.5 - 8.1 g/dL - - 7.3  Total Bilirubin 0.3 - 1.2 mg/dL - - 0.8  Alkaline Phos 38 - 126 U/L - - 39  AST 15 - 41 U/L - - 17  ALT 0 - 44 U/L - - 11   CBC Latest Ref Rng & Units 06/19/2020 07/06/2020 06/25/2020  WBC 4.0 - 10.5 K/uL 5.7 6.0 -  Hemoglobin 13.0 - 17.0 g/dL 10.8(L) 10.0(L) 8.2(L)  Hematocrit 39.0 - 52.0 % 33.4(L) 31.3(L) -  Platelets 150 - 400 K/uL 180 188 -   Lipid Panel Recent Labs    06/26/19 1014 12/19/19 1328  CHOL 143 121  TRIG 123 94  LDLCALC 80 63  HDL 41 40    HEMOGLOBIN A1C Lab Results  Component Value Date   HGBA1C 5.8 (H) 03/30/2017   MPG 119.76 03/30/2017   TSH Recent Labs    06/26/19 1014  TSH 1.960   BNP (last 3 results) Recent Labs    07/04/2020 0457 06/30/2020 0251 06/19/20 0220  BNP 1,030.9* 651.7* 438.4*    Medications and allergies   Allergies  Allergen Reactions  . Penicillin G Other (See Comments)  . Penicillins Other (See Comments)    DID THE REACTION INVOLVE: Swelling of the face/tongue/throat, SOB, or low BP? Unknown Sudden or severe rash/hives, skin peeling,  or the inside of the mouth or nose? Unknown Did it require medical treatment? Unknown When did it last happen?70+ years ago If all above answers are "NO", may proceed with cephalosporin use.      No current facility-administered medications on file prior to encounter.   Current Outpatient Medications on File Prior to Encounter  Medication Sig Dispense Refill  . ALPRAZolam (XANAX) 0.5 MG tablet Take 1 tablet (0.5 mg total) at bedtime as needed by mouth. (Patient taking differently: Take 0.5 mg by mouth See admin instructions. Take 0.5 mg at bedtime, may take an additional 0.5 mg dose as needed for anxiety) 45 tablet 0  . aspirin EC 81 MG tablet Take 81  mg by mouth at bedtime.    Marland Kitchen atorvastatin (LIPITOR) 20 MG tablet TAKE 1 TABLET BY MOUTH EVERYDAY AT BEDTIME (Patient taking differently: Take 20 mg by mouth daily.) 90 tablet 3  . carvedilol (COREG) 25 MG tablet Take 25 mg by mouth 2 (two) times daily with a meal.    . DULoxetine (CYMBALTA) 60 MG capsule Take 60 mg by mouth at bedtime.    Marland Kitchen ezetimibe (ZETIA) 10 MG tablet Take 1 tablet (10 mg total) by mouth daily after supper. 90 tablet 3  . isosorbide mononitrate (IMDUR) 30 MG 24 hr tablet TAKE 2 TABLETS (60 MG TOTAL) BY MOUTH 2 (TWO) TIMES DAILY. 360 tablet 0  . LINZESS 145 MCG CAPS capsule Take 145 mcg by mouth daily as needed (constipation).   12  . losartan (COZAAR) 25 MG tablet Take 25 mg by mouth daily.    . nitroGLYCERIN (NITROSTAT) 0.4 MG SL tablet PLACE 1 TABLET UNDER THE TONGUE EVERY 5 MINUTES AS NEEDED FOR CHEST PAIN. (Patient taking differently: Place 0.4 mg under the tongue every 5 (five) minutes as needed for chest pain.) 75 tablet 2  . pantoprazole (PROTONIX) 40 MG tablet Take 40 mg by mouth 2 (two) times daily.    . ranolazine (RANEXA) 1000 MG SR tablet TAKE 1 TABLET BY MOUTH TWICE A DAY (Patient taking differently: Take 1,000 mg by mouth 2 (two) times daily.) 180 tablet 0     Scheduled Meds: . atorvastatin  20 mg Oral Daily  . chlorhexidine  15 mL Mouth Rinse BID  . Chlorhexidine Gluconate Cloth  6 each Topical Daily  . DULoxetine  60 mg Oral QHS  . ezetimibe  10 mg Oral QPC supper  . furosemide  40 mg Intravenous Q12H  . mouth rinse  15 mL Mouth Rinse q12n4p  . nitroGLYCERIN      . pantoprazole  40 mg Intravenous Q12H   Continuous Infusions: . diltiazem (CARDIZEM) infusion 10 mg/hr (06/19/20 1020)   PRN Meds:.hydrALAZINE, LORazepam, ondansetron **OR** ondansetron (ZOFRAN) IV   I/O last 3 completed shifts: In: 300 [I.V.:300] Out: 2490 [Urine:2490] No intake/output data recorded.   Radiology:   CT HEAD WO CONTRAST  Result Date: 07/05/2020 CLINICAL DATA:  Mental  status change.  Recent fall. EXAM: CT HEAD WITHOUT CONTRAST TECHNIQUE: Contiguous axial images were obtained from the base of the skull through the vertex without intravenous contrast. COMPARISON:  CT head 06/11/2020 FINDINGS: Brain: Generalized atrophy. Moderate white matter changes most consistent with chronic ischemia. 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 Negative for acute infarct, hemorrhage, mass. Vascular: Negative for hyperdense vessel. Atherosclerotic calcification in the carotid and vertebral arteries bilaterally. Skull: Negative Sinuses/Orbits: Negative Other: None IMPRESSION: Atrophy and chronic microvascular ischemia. No acute abnormality no change from yesterday. Electronically  Signed   By: Marlan Palauharles  Clark [name]   On: Aug 09, 2020 17:49   DG CHEST PORT 1 VIEW  Result Date: 06/15/2020 CLINICAL DATA:  Dyspnea EXAM: PORTABLE CHEST 1 VIEW COMPARISON:  Aug 09, 2020 FINDINGS: Postop CABG. Heart size upper normal. Vascular congestion and bilateral airspace disease likely due to edema. Small right pleural effusion and right lower lobe atelectasis unchanged. Improved aeration in the left lung base. IMPRESSION: Congestive heart failure. Mild edema and small right effusion. Improved aeration in the left lung base otherwise little interval change from earlier today. Electronically Signed   By: Marlan Palauharles  Clark [name]   On: Aug 09, 2020 13:55    Cardiac Studies:   Coronary angiogram 05/01/2018: Left main patent, diffuse disease of native RCA, LAD circumflex occluded. LIMA to LAD patent, SVG to M2 patent with insertion site mismatch. Known occluded SVG to RCA and SVG to D1.  Peripheral angiogram 05/01/2018: Left SFA tandem 95% stenosis S/P directional atherectomy with ith HawkOne LS and InPact Admiral DCB 5.0 X 250 mm balloon. Left PT occluded. Right ostial SFA 70%, right mid to distal SFA and mid popliteal 95% stenosis, right PT occluded.  ABI 06/04/2018 : This exam  reveals moderately decreased perfusion of the right lower extremity, noted at the post tibial artery level (ABI 0.68). This exam reveals mildly decreased perfusion of the left lower extremity, noted at the dorsalis pedis artery level (ABI 0.83).  Compared to 02/14/17, Left ABI has improved from 0.50 and suggests successful revascularization.  Carotid artery duplex 10/18/2019:  Stenosis in the right internal carotid artery (50-69%). Stenosis in the right external carotid artery (<50%).  Stenosis in the left external carotid artery (<50%).  Antegrade right vertebral artery flow. Antegrade left vertebral artery flow.  Follow up in six months is appropriate if clinically indicated. No significant change from 04/19/2019.  Carotid artery duplex 05/20/2020:  Stenosis in the right internal carotid artery (50-69%). Stenosis in the right external carotid artery (<50%).  Stenosis in the left external carotid artery (<50%). Left carotid endarterectomy site widely patent.  Antegrade right vertebral artery flow. Antegrade left vertebral artery flow.  Follow up in six months is appropriate if clinically indicated. No significant change from 10/18/2019.   EKG   EKG 06/03/2020: Sinus rhythm at a rate of 64 bpm. Left axis, left anterior fascicular block. Poor refreshing, cannot exclude anteroseptal infarct old. Nonspecific T abnormality, cannot exclude lateral ischemia.   EKG 06/19/2020: Atrial fibrillation with rapid ventricular response at rate of 116 bpm, left axis deviation, left anterior fascicular block.  Diffuse anterolateral ST segment depression, subendocardial ischemia versus infarct.  LVH.  Compared to 06/03/2020, atrial fibrillation and marked ischemic ST-T wave changes is new.  Assessment   1.  New onset atrial fibrillation with rapid ventricular response  CHA2DS2-VASc Score is 6.  Yearly risk of stroke: 9.8% (A, HTN, Vasc Dz, TIA).  Score of 1=0.6; 2=2.2; 3=3.2; 4=4.8; 5=7.2; 6=9.8;  7=>9.8) -(CHF; HTN; vasc disease DM,  Male = 1; Age <65 =0; 65-74 = 1,  >75 =2; stroke/embolism= 2).   2.  Abnormal EKG suggestive of non-STEMI 3.  Acute GI bleed needing blood transfusion, not a candidate for anticoagulation. 4.  Acute on chronic diastolic heart failure, presently heart failure symptoms are essentially resolved. 5.  Stage IIIb chronic kidney disease    Recommendations:   Extremely difficult situation, patient appears to be having non-STEMI or it could be demand ischemia from A. fib with RVR.  We will obtain serum troponins on a serial basis.  He is not a candidate for aspirin or anticoagulation presently due to recent GI bleed needing blood transfusion. With regard to heart failure, blood pressure is very soft, previously on carvedilol, 12.5 mg twice daily, will start him on metoprolol 25 mg p.o. twice daily, reduce his Lasix to p.o. dose as previous as he does not appear to be in florid congestive heart failure and BNP is trending down. I will start him on amiodarone 400 mg p.o. 3 times daily.  At this point rate control strategy.  His A. fib could have been precipitated by active ischemia if troponins are markedly elevated and could also be related to recent demand ischemia from severe anemia as well.  Not a candidate for ACE inhibitor's or ARB due to soft blood pressure and also chronic renal failure.  He is DNR, I have discussed his medical issues with his wife over the phone.  Conservative therapy for now.   Yates Decamp, MD, [facility] 06/19/2020, 11:08 AM Office: [phone] Pager: [phone]

## 2020-06-19 NOTE — Progress Notes (Signed)
PT Cancellation Note  Patient Details Name: [name] MRN: [id number] DOB: 12-Aug-1945   Cancelled Treatment:    Reason Eval/Treat Not Completed: Medical issues which prohibited therapy (Patient with new onset A-fib this AM and EKG concerning for possible NSTEMI vs demand ischemia due to anemia and A-fib. Pt started on cardizem drip and will switch to amiodarone. Will hold and follow up at later date/time as schedule allows and pt able.)  Wynn Maudlin, DPT Acute Rehabilitation Services Office [phone] Pager [phone]   Anitra Lauth 06/19/2020, 12:52 PM

## 2020-06-19 NOTE — Progress Notes (Signed)
Subjective: No complaints.    Objective: Vital signs in last 24 hours: Temp:  [97.7 F (36.5 C)-98.5 F (36.9 C)] 98.5 F (36.9 C) (03/11 1600) Pulse Rate:  [61-101] 64 (03/11 1300) Resp:  [15-25] 24 (03/11 1300) BP: (95-153)/(39-57) 108/44 (03/11 1300) SpO2:  [96 %-100 %] 96 % (03/11 1300) Weight:  [77.1 kg] 77.1 kg (03/11 0500) Last BM Date: 07/02/2020  Intake/Output from previous day: 03/10 0701 - 03/11 0700 In: 300 [I.V.:300] Out: 1000 [Urine:1000] Intake/Output this shift: Total I/O In: 268.2 [P.O.:240; I.V.:28.2] Out: -   General appearance: alert, no distress and slowed mentation Resp: clear to auscultation bilaterally Cardio: RRR with a 3/6 blowing murmur GI: soft, non-tender; bowel sounds normal; no masses,  no organomegaly Extremities: extremities normal, atraumatic, no cyanosis or edema  Lab Results: Recent Labs    06/09/2020 0251 06/19/20 0220  WBC 6.0 5.7  HGB 10.0* 10.8*  HCT 31.3* 33.4*  PLT 188 180   BMET Recent Labs    06/10/2020 0457 06/11/2020 0251 06/19/20 0220  NA 138 139 139  K 4.6 3.8 4.0  CL 103 98 94*  CO2 27 31 32  GLUCOSE 96 94 97  BUN 40* 34* 32*  CREATININE 1.65* 1.54* 1.44*  CALCIUM 9.3 9.2 9.1   LFT Recent Labs    06/22/2020 0457  PROT 7.3  ALBUMIN 3.7  AST 17  ALT 11  ALKPHOS 39  BILITOT 0.8   PT/INR No results for input(s): LABPROT, INR in the last 72 hours. Hepatitis Panel No results for input(s): HEPBSAG, HCVAB, HEPAIGM, HEPBIGM in the last 72 hours. C-Diff No results for input(s): CDIFFTOX in the last 72 hours. Fecal Lactopherrin No results for input(s): FECLLACTOFRN in the last 72 hours.  Studies/Results: CT HEAD WO CONTRAST  Result Date: 07/02/2020 CLINICAL DATA:  Mental status change.  Recent fall. EXAM: CT HEAD WITHOUT CONTRAST TECHNIQUE: Contiguous axial images were obtained from the base of the skull through the vertex without intravenous contrast. COMPARISON:  CT head 06/16/2020 FINDINGS: Brain: Generalized  atrophy. Moderate white matter changes most consistent with chronic ischemia. 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 Negative for acute infarct, hemorrhage, mass. Vascular: Negative for hyperdense vessel. Atherosclerotic calcification in the carotid and vertebral arteries bilaterally. Skull: Negative Sinuses/Orbits: Negative Other: None IMPRESSION: Atrophy and chronic microvascular ischemia. No acute abnormality no change from yesterday. Electronically Signed   By: Marlan Palau [name]   On: 07/06/2020 17:49    Medications:  Scheduled: . amiodarone  400 mg Oral Q8H  . atorvastatin  20 mg Oral Daily  . chlorhexidine  15 mL Mouth Rinse BID  . Chlorhexidine Gluconate Cloth  6 each Topical Daily  . DULoxetine  60 mg Oral QHS  . ezetimibe  10 mg Oral QPC supper  . [START ON 06/20/2020] furosemide  40 mg Oral Daily  . mouth rinse  15 mL Mouth Rinse q12n4p  . metoprolol tartrate  25 mg Oral Q8H  . nitroGLYCERIN      . pantoprazole  40 mg Intravenous Q12H   Continuous: . diltiazem (CARDIZEM) infusion Stopped (06/19/20 1252)    Assessment/Plan: 1) Anemia - stable. 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  No H. Pylori. 3) New onset afib with RVR. 4) Non-STEMI versis demand ischemia from afib with RVR.   From the GI standpoint he is stable.  With the new onset afib with RVR he will not be able to undergo a colonoscopy.  In fact,  this latest cardiac development supports the decision not to perform the procedure as a result of his poor health status.  Plan: 1) Follow HGB and transfuse as needed. 2) Continue with PPI.  He can be converted over the PO QD. 3) Signing off.  LOS: 2 days   HUNG,PATRICK D 06/19/2020, 4:05 PM

## 2020-06-20 ENCOUNTER — Other Ambulatory Visit: Payer: Self-pay | Admitting: Cardiology

## 2020-06-20 DIAGNOSIS — I5033 Acute on chronic diastolic (congestive) heart failure: Secondary | ICD-10-CM | POA: Diagnosis not present

## 2020-06-20 DIAGNOSIS — J9622 Acute and chronic respiratory failure with hypercapnia: Secondary | ICD-10-CM | POA: Diagnosis not present

## 2020-06-20 DIAGNOSIS — I209 Angina pectoris, unspecified: Secondary | ICD-10-CM

## 2020-06-20 DIAGNOSIS — N179 Acute kidney failure, unspecified: Secondary | ICD-10-CM | POA: Diagnosis not present

## 2020-06-20 DIAGNOSIS — K922 Gastrointestinal hemorrhage, unspecified: Secondary | ICD-10-CM | POA: Diagnosis not present

## 2020-06-20 LAB — CBC
HCT: 32.7 % — ABNORMAL LOW (ref 39.0–52.0)
Hemoglobin: 10.5 g/dL — ABNORMAL LOW (ref 13.0–17.0)
MCH: 30.2 pg (ref 26.0–34.0)
MCHC: 32.1 g/dL (ref 30.0–36.0)
MCV: 94 fL (ref 80.0–100.0)
Platelets: 188 10*3/uL (ref 150–400)
RBC: 3.48 MIL/uL — ABNORMAL LOW (ref 4.22–5.81)
RDW: 14.6 % (ref 11.5–15.5)
WBC: 6.7 10*3/uL (ref 4.0–10.5)
nRBC: 0 % (ref 0.0–0.2)

## 2020-06-20 LAB — BASIC METABOLIC PANEL
Anion gap: 13 (ref 5–15)
BUN: 33 mg/dL — ABNORMAL HIGH (ref 8–23)
CO2: 33 mmol/L — ABNORMAL HIGH (ref 22–32)
Calcium: 9 mg/dL (ref 8.9–10.3)
Chloride: 93 mmol/L — ABNORMAL LOW (ref 98–111)
Creatinine, Ser: 1.43 mg/dL — ABNORMAL HIGH (ref 0.61–1.24)
GFR, Estimated: 51 mL/min — ABNORMAL LOW (ref >60)
Glucose, Bld: 113 mg/dL — ABNORMAL HIGH (ref 70–99)
Potassium: 3.9 mmol/L (ref 3.5–5.1)
Sodium: 139 mmol/L (ref 135–145)

## 2020-06-20 MED ORDER — ALPRAZOLAM 0.5 MG PO TABS
0.5000 mg | ORAL_TABLET | Freq: Once | ORAL | Status: AC
  Administered 2020-06-20: 0.5 mg via ORAL
  Filled 2020-06-20: qty 1

## 2020-06-20 MED ORDER — AMIODARONE HCL 200 MG PO TABS
200.0000 mg | ORAL_TABLET | Freq: Every day | ORAL | 0 refills | Status: AC
Start: 2020-06-20 — End: ?

## 2020-06-20 MED ORDER — NITROGLYCERIN 0.4 MG SL SUBL
SUBLINGUAL_TABLET | SUBLINGUAL | Status: AC
  Administered 2020-06-20: 0.4 mg
  Filled 2020-06-20: qty 1

## 2020-06-20 MED ORDER — NITROGLYCERIN 0.4 MG SL SUBL
0.4000 mg | SUBLINGUAL_TABLET | SUBLINGUAL | Status: DC | PRN
  Administered 2020-06-20: 0.4 mg via SUBLINGUAL
  Filled 2020-06-20: qty 1

## 2020-06-20 MED ORDER — AMIODARONE HCL 200 MG PO TABS
200.0000 mg | ORAL_TABLET | Freq: Two times a day (BID) | ORAL | Status: DC
  Administered 2020-06-20: 200 mg via ORAL
  Filled 2020-06-20 (×2): qty 1

## 2020-06-20 MED ORDER — ALPRAZOLAM 0.25 MG PO TABS: 0.2500 mg | ORAL_TABLET | Freq: Once | ORAL | Status: DC

## 2020-06-20 NOTE — Progress Notes (Signed)
Attempted to place pt on nocturnal bipap, but pt would not keep mask on.  Pt repeatedly stated, "take it off."  RN aware.  Pt placed back on nasal cannula.  Bipap remains in room on standby as needed.

## 2020-06-20 NOTE — Evaluation (Signed)
Occupational Therapy Evaluation Patient Details Name: [name] MRN: [id number] DOB: Jun 10, 1945 Today's Date: 06/20/2020    History of Present Illness Patient is 75 y.o. male admitted for concerns of acute GI bleed, weakness, and falls. PMH significant for stroke, MI, HTN, DM, dementia, CAD, CKD, OA, depression, CABG [id number].   Clinical Impression   Mr.[name] is a 75 year old man who lives at home with his wife and has a caregiver Dorene Sorrow that assists with more IADLs as patient reports typically being able to ambulate with RW and perform ADLs - though not showering. Patient presents with generalized weakness, decreased activity tolerance and impaired balance. On evaluation patient supervision for transfer to side of bed and min guard to stand and pivot to recliner. Patient required assistance for lower body bathing and pericare after being incontinent of BM. Patient also exhibits poor standing tolerance - only able to stand approximately 15 seconds before sitting. Patient able to don socks at side of bed. Patient will benefit from skilled OT services while in hospital to improve deficits and learn compensatory strategies as needed in order to return PLOF.  At this time patient's activity tolerance severely limited and recommend short term rehab at discharge. If patient improves may be able to go home with [facility] services.    Follow Up Recommendations  Home health OT;SNF    Equipment Recommendations  Tub/shower seat    Recommendations for Other Services       Precautions / Restrictions Precautions Precautions: Fall Restrictions Weight Bearing Restrictions: No      Mobility Bed Mobility Overal bed mobility: Needs Assistance Bed Mobility: Supine to Sit     Supine to sit: Supervision;HOB elevated     General bed mobility comments: supervision to transfer to side of bed - required increased time and use of bed rails.    Transfers Overall transfer level: Needs  assistance Equipment used: Rolling walker (2 wheeled) Transfers: Sit to/from [employer] Sit to Stand: Min guard Stand pivot transfers: Min guard       General transfer comment: min guard for standing x 2 and transfer to recliner. Patient    Balance Overall balance assessment: Mild deficits observed, not formally tested Sitting-balance support: No upper extremity supported Sitting balance-Leahy Scale: Good     Standing balance support: During functional activity;Bilateral upper extremity supported Standing balance-Leahy Scale: Fair                             ADL either performed or assessed with clinical judgement   ADL Overall ADL's : Needs assistance/impaired Eating/Feeding: Independent   Grooming: Set up;Sitting   Upper Body Bathing: Set up;Sitting   Lower Body Bathing: Minimal assistance;Sit to/from stand   Upper Body Dressing : Set up;Sitting   Lower Body Dressing: Minimal assistance;Sit to/from stand   Toilet Transfer: Min guard;RW;Stand-pivot;BSC   Toileting- Architect and Hygiene: Moderate assistance;Sit to/from stand               Vision Patient Visual Report: No change from baseline       Perception     Praxis      Pertinent Vitals/Pain Pain Assessment: No/denies pain     Hand Dominance     Extremity/Trunk Assessment Upper Extremity Assessment Upper Extremity Assessment: Generalized weakness   Lower Extremity Assessment Lower Extremity Assessment: Defer to PT evaluation   Cervical / Trunk Assessment Cervical / Trunk Assessment: Kyphotic   Communication Communication Communication:  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)   Cognition Arousal/Alertness: Awake/alert Behavior During Therapy: WFL for tasks assessed/performed Overall Cognitive Status: Within Functional Limits for tasks assessed                                     General Comments       Exercises     Shoulder Instructions       Home Living Family/patient expects to be discharged to:: Private residence Living Arrangements: Spouse/significant other (wife recently put into hospice care) Available Help at Discharge: Family;Friend(s) Type of Home: Apartment Home Access: Level entry;Elevator;Stairs to enter     Home Layout: One level     Bathroom Shower/Tub: Walk-in shower (has lip/threshold)   Bathroom Toilet: Handicapped height Bathroom Accessibility: Yes   Home Equipment: Environmental consultant - 4 wheels;Shower seat;Hand held shower head          Prior Functioning/Environment Level of Independence: Needs assistance  Gait / Transfers Assistance Needed: ambulates with rollator     Comments: pt reports he has a caregiver who assists him as needed. "Dorene Sorrow". pt reports he needs help with home making, preparing meals, grocery shopping, but that he typically mobilizes with rollator independently and dresses himself. He has not been bathing in shower due to fear of falling.        OT Problem List: Decreased strength;Decreased activity tolerance;Impaired balance (sitting and/or standing);Decreased safety awareness;Decreased knowledge of use of DME or AE      OT Treatment/Interventions: Self-care/ADL training;Therapeutic exercise;DME and/or AE instruction;Therapeutic activities;Patient/family education;Balance training    OT Goals(Current goals can be found in the care plan section) Acute Rehab OT Goals Patient Stated Goal: improve strength and independence to be able to shower again OT Goal Formulation: With patient Time For Goal Achievement: 07/04/20 Potential to Achieve Goals: Good  OT Frequency: Min 2X/week   Barriers to D/C:            Co-evaluation              AM-PAC OT "6 Clicks" Daily Activity     Outcome Measure Help from another person eating meals?: None Help from another person taking care of personal grooming?: A Little Help from another person toileting, which includes using toliet,  bedpan, or urinal?: A Little Help from another person bathing (including washing, rinsing, drying)?: A Little Help from another person to put on and taking off regular upper body clothing?: A Little Help from another person to put on and taking off regular lower body clothing?: A Little 6 Click Score: 19   End of Session Equipment Utilized During Treatment: Gait belt;Rolling walker Nurse Communication: Mobility status  Activity Tolerance: Patient tolerated treatment well Patient left: in chair;with call bell/phone within reach;with chair alarm set  OT Visit Diagnosis: Unsteadiness on feet (R26.81);Muscle weakness (generalized) (M62.81)                Time: 1443-1540 OT Time Calculation (min): 27 min Charges:  OT General Charges $OT Visit: 1 Visit OT Evaluation $OT Eval Low Complexity: 1 Low $OT Eval Moderate Complexity: 1 Mod OT Treatments $Self Care/Home Management : 8-22 mins  Preslei Blakley, OTR/L Acute Care Rehab Services  Office [phone] Pager: [phone]   Kelli Churn 06/20/2020, 4:04 PM

## 2020-06-20 NOTE — Progress Notes (Signed)
Pt was notified of transfer. Pt's belongings were packed including glasses & hearing aid case. Pt was brought up to 5E were the nurse & nurse tech were notified of pt. Pt placed in bed and was stable upon leaving.

## 2020-06-20 NOTE — Progress Notes (Addendum)
Provider notified of pts refusal of bipap   Pt states home 02 is set at 4 L, pt currently on 2 L. RR,HR, o2 wnl-may decrease co2 being on less oxygen since refusing bipap

## 2020-06-20 NOTE — Progress Notes (Signed)
Pt confused but answers questions correctly. Pt anxious about "leaving to go to rehab tomorrow". I explained per notes to pt it doesn't look like he is going to rehab tomorrow.

## 2020-06-20 NOTE — Progress Notes (Addendum)
[facility]                                                                              Patient Demographics  [name], is a 75 y.o. male, DOB - 1945-07-23, YKD:[id number]  Admit date - 06/15/2020   Admitting Physician Teddy Spike, DO  Outpatient Primary MD for the patient is [name], MD  Outpatient specialists:   LOS - 3  days   Medical records reviewed and are as summarized below:    Chief Complaint  Patient presents with  . Fall  . Abnormal Lab       Brief summary   Patient is a 75 year old male with history of hypertension, hyperlipidemia, CAD, obstructive sleep apnea presented with generalized weakness and falls.  Patient was noted to be somewhat confused, his friend reported that he has been generally weak and had falls for the last 4 days.  He had hit his head at least once but no loss of consciousness or syncopal episodes.  Hemoglobin was found to be 7 and significant drop from his previous readings.  Patient was sent to ED by PCP. Hemoglobin 7.2, was transfused 2 units packed RBCs.  GI was consulted.    Assessment & Plan    Principal Problem:   Acute upper GI bleed with underlying history of GERD, ABLA -Patient noted to have hemoglobin of 7.2 on admission, previously 13.2 on 06/26/2019 -Patient was placed on IV Protonix, transfused 2 units packed RBCs -GI was consulted, underwent endoscopy 3/10, normal esophagus, erosive gastropathy with no stigmata of bleeding, erythematous duodenopathy. -Currently not a candidate for undergoing colonoscopy due to cardiac issues, follow CBC -H&H stable 10.5  Active Problems:   Acute on chronic respiratory failure with hypercapnia (HCC) with acute metabolic encephalopathy -Overnight on 3/9, patient was noted to be in fluid overload, had received packed RBC transfusion.  In the morning was lethargic, tachypneic and short of breath.  Was placed on 5 L O2 via [place], fluid overload on the chest x-ray.  BNP  1030.  ABG showed PCO2 65.7, pH 7.2.  Per patient's wife, used to be heavy smoker and has history of OSA, had refused CPAP but was using 2 L O2 at night.  -Continue BiPAP qhs.  O2 sats 100% on 3 L, improving   Atrial fibrillation with RVR, new onset, NSTEMI -Patient noted to be in rapid atrial fibrillation on 3/11, heart rate 96-130s, confirmed with EKG -Initially placed on Cardizem drip, digoxin IV x1.  Cardiology, Dr Jacinto Halim consulted, placed on amiodarone, beta-blocker.   -EKG suggesting NSTEMI, elevated troponins likely due to demand ischemia, A. fib with RVR -Not a candidate for heparin drip due to recent GI bleed.    Acute on chronic diastolic CHF (congestive heart failure) (HCC) -Likely due to fluid overload and packed RBC transfusion.  BNP 1030, patient has a history of diastolic CHF, trended down with diuresis -Previous echo on 06/02/2018 had shown EF of 50 to 55%, normal systolic function, repeat echo pending -Currently stable, Lasix transitioned to p.o. starting today   Acute metabolic encephalopathy -Possibly due to hypercapnia, OSA not on CPAP, Xanax.  Resolved -  currently alert and oriented, at baseline -CT head on admission showed no intracranial bleed.  Repeat CT head showed no changes.    CAD (coronary artery disease) of artery bypass graft -Currently improving, no chest pain, no acute shortness of breath.   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 (HCC) -Baseline creatinine 1.4 -Presented with creatinine of 2.2 on admission, continue to hold losartan -Resolved, currently at baseline     COPD (chronic obstructive pulmonary disease) (HCC), obstructive sleep apnea - Likely has underlying emphysema due to remote heavy smoking, has been diagnosed with OSA, not consistently using CPAP -May benefit from CPAP nightly however patient has declined in the past per his wife   Generalized weakness, falls -PT OT evaluation  recommended SNF, SW consulted    Code Status: Full CODE STATUS DVT Prophylaxis:  SCDs Start: 04-Dec-2020 0222   Level of Care: Level of care: Telemetry Family Communication: Discussed all imaging results, lab results, management explained to the patient's wife (phone number [phone]). Wife requested SNF placement, patient's wife will not be able to take care of him at home, she is currently is on hospice herself.   Disposition Plan:     Status is: Inpatient  Remains inpatient appropriate because:Inpatient level of care appropriate due to severity of illness   Dispo: The patient is from: Home              Anticipated d/c is to: TBD              Patient currently is not medically stable to d/c.  Very deconditioned, heart rate now controlled, needs SNF   Difficult to place patient No    Time Spent in minutes 35 minutes  Procedures:  Echo  Consultants:   Gastroenterology Cardiology   Antimicrobials:   Anti-infectives (From admission, onward)   None         Medications  Scheduled Meds: . amiodarone  400 mg Oral Q8H  . atorvastatin  20 mg Oral Daily  . chlorhexidine  15 mL Mouth Rinse BID  . Chlorhexidine Gluconate Cloth  6 each Topical Daily  . DULoxetine  60 mg Oral QHS  . ezetimibe  10 mg Oral QPC supper  . furosemide  40 mg Oral Daily  . mouth rinse  15 mL Mouth Rinse q12n4p  . metoprolol tartrate  25 mg Oral Q8H  . pantoprazole  40 mg Intravenous Q12H   Continuous Infusions:  PRN Meds:.hydrALAZINE, LORazepam, ondansetron **OR** ondansetron (ZOFRAN) IV      Subjective:   [name] was seen and examined today.  Heart rate now controlled, no chest pain no acute shortness of breath.  No nausea or vomiting or dizziness.  Very deconditioned and weak.    Objective:   Vitals:   06/20/20 0622 06/20/20 0700 06/20/20 0800 06/20/20 1000  BP: (!) 146/41  (!) 137/50 120/64  Pulse: 65 (!) 58 (!) 58 61  Resp:  18 (!) 21 (!) 23  Temp:   98.4 F (36.9 C)    TempSrc:   Oral   SpO2:  99% 97% 98%  Weight:        Intake/Output Summary (Last 24 hours) at 06/20/2020 1052 Last data filed at 06/20/2020 0910 Gross per 24 hour  Intake 615.94 ml  Output 900 ml  Net -284.06 ml     Wt Readings from Last 3 Encounters:  06/19/20 77.1 kg  06/03/20 83 kg  03/23/20 83.9 kg  Physical Exam  General: Alert and oriented x 3, NAD  Cardiovascular: S1 S2 clear, RRR. No pedal edema b/l  Respiratory: Decreased breath sound at the bases  Gastrointestinal: Soft, nontender, nondistended, NBS  Ext: no pedal edema bilaterally  Neuro: no new deficits  Musculoskeletal: No cyanosis, clubbing  Skin: No rashes  Psych: Normal affect and demeanor, alert and oriented x3     Data Reviewed:  I have personally reviewed following labs and imaging studies  Micro Results Recent Results (from the past 240 hour(s))  SARS CORONAVIRUS 2 (TAT 6-24 HRS) Nasopharyngeal Nasopharyngeal Swab     Status: None   Collection Time: 06/25/2020  1:40 AM   Specimen: Nasopharyngeal Swab  Result Value Ref Range Status   SARS Coronavirus 2 NEGATIVE NEGATIVE Final    Comment: (NOTE) SARS-CoV-2 target nucleic acids are NOT DETECTED.  The SARS-CoV-2 RNA is generally detectable in upper and lower respiratory specimens during the acute phase of infection. Negative results do not preclude SARS-CoV-2 infection, do not rule out co-infections with other pathogens, and should not be used as the sole basis for treatment or other patient management decisions. Negative results must be combined with clinical observations, patient history, and epidemiological information. The expected result is Negative.  Fact Sheet for Patients: HairSlick.no  Fact Sheet for Healthcare Providers: quierodirigir.com  This test is not yet approved or cleared by the [place] FDA and  has been authorized for detection and/or diagnosis of  SARS-CoV-2 by FDA under an Emergency Use Authorization (EUA). This EUA will remain  in effect (meaning this test can be used) for the duration of the COVID-19 declaration under Se ction 564(b)(1) of the Act, 21 U.S.C. section 360bbb-3(b)(1), unless the authorization is terminated or revoked sooner.  Performed at [facility], [address], [place], [place] [address]   MRSA PCR Screening     Status: None   Collection Time: 06/15/2020  4:03 PM   Specimen: Nasopharyngeal  Result Value Ref Range Status   MRSA by PCR NEGATIVE NEGATIVE Final    Comment:        The GeneXpert MRSA Assay (FDA approved for NASAL specimens only), is one component of a comprehensive MRSA colonization surveillance program. It is not intended to diagnose MRSA infection nor to guide or monitor treatment for MRSA infections. Performed at [facility], [address], [place], [place] [address]     Radiology Reports CT HEAD WO CONTRAST  Result Date: 07/01/2020 CLINICAL DATA:  Mental status change.  Recent fall. EXAM: CT HEAD WITHOUT CONTRAST TECHNIQUE: Contiguous axial images were obtained from the base of the skull through the vertex without intravenous contrast. COMPARISON:  CT head 06/20/2020 FINDINGS: Brain: Generalized atrophy. Moderate white matter changes most consistent with chronic ischemia. 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 Negative for acute infarct, hemorrhage, mass. Vascular: Negative for hyperdense vessel. Atherosclerotic calcification in the carotid and vertebral arteries bilaterally. Skull: Negative Sinuses/Orbits: Negative Other: None IMPRESSION: Atrophy and chronic microvascular ischemia. No acute abnormality no change from yesterday. Electronically Signed   By: Marlan Palau [name]   On: 06/25/2020 17:49   CT Head Wo Contrast  Result Date: 06/26/2020 CLINICAL DATA:  History of multiple falls today EXAM: CT HEAD WITHOUT  CONTRAST TECHNIQUE: Contiguous axial images were obtained from the base of the skull through the vertex without intravenous contrast. COMPARISON:  03/29/2017 FINDINGS: Brain: No evidence of acute infarction, hemorrhage, hydrocephalus, extra-axial collection or mass lesion/mass effect. Chronic  atrophic and ischemic changes are noted. Vascular: No hyperdense vessel or unexpected calcification. Skull: Normal. Negative for fracture or focal lesion. Sinuses/Orbits: No acute finding. Other: None. IMPRESSION: Chronic atrophic and ischemic changes without acute abnormality. Electronically Signed   By: Alcide Clever [name]   On: 06/30/2020 01:44   DG CHEST PORT 1 VIEW  Result Date: 07/01/2020 CLINICAL DATA:  Dyspnea EXAM: PORTABLE CHEST 1 VIEW COMPARISON:  06/13/2020 FINDINGS: Postop CABG. Heart size upper normal. Vascular congestion and bilateral airspace disease likely due to edema. Small right pleural effusion and right lower lobe atelectasis unchanged. Improved aeration in the left lung base. IMPRESSION: Congestive heart failure. Mild edema and small right effusion. Improved aeration in the left lung base otherwise little interval change from earlier today. Electronically Signed   By: Marlan Palau [name]   On: 06/20/2020 13:55   DG CHEST PORT 1 VIEW  Result Date: 06/20/2020 CLINICAL DATA:  Dyspnea EXAM: PORTABLE CHEST 1 VIEW COMPARISON:  01/21/2011 FINDINGS: The lungs are symmetrically well expanded. Moderate right pleural effusion. Bibasilar pulmonary infiltrates are present most in keeping with mild interstitial pulmonary edema. No pneumothorax. Coronary artery bypass grafting has been performed. Cardiac size is within normal limits. No acute bone abnormality. IMPRESSION: Mild cardiogenic failure.  Moderate right pleural effusion. Electronically Signed   By: Helyn Numbers MD   On: 06/28/2020 01:44   ECHOCARDIOGRAM COMPLETE  Result Date: 06/19/2020    ECHOCARDIOGRAM REPORT   Patient Name:   [name] Date of  Exam: 06/19/2020 Medical Rec #:  [id number]     Height:       69.0 in Accession #:    [id number]    Weight:       170.0 lb Date of Birth:  04-Apr-1946      BSA:          1.928 m Patient Age:    74 years      BP:           94/48 mmHg Patient Gender: M             HR:           106 bpm. Exam Location:  Inpatient Procedure: 2D Echo, Cardiac Doppler, Color Doppler and Intracardiac            Opacification Agent Indications:     I50.22 Chronic systolic (congestive) heart failure  History:         Patient has prior history of Echocardiogram examinations, most                  recent 06/02/2018. Previous Myocardial Infarction and CAD,                  Stroke; Risk Factors:Sleep Apnea, Hypertension and Diabetes.                  CKD.  Sonographer:     Elmarie Shiley Dance Referring Phys:  [id number] K Wilder Amodei Diagnosing Phys: Yates Decamp MD IMPRESSIONS  1. Left ventricular ejection fraction, by estimation, is 55 to 60%. The left ventricle has normal function. The left ventricle has no regional wall motion abnormalities. There is mild left ventricular hypertrophy. Left ventricular diastolic parameters are indeterminate.  2. Right ventricular systolic function is normal. The right ventricular size is not well visualized.  3. Left atrial size was moderately dilated.  4. The mitral valve was not well visualized. Trivial mitral valve regurgitation.  5. The aortic valve is calcified. There is severe  calcifcation of the aortic valve. There is moderate thickening of the aortic valve. Moderate aortic valve stenosis. Aortic valve mean gradient measures 23.5 mmHg. Aortic valve peak gradient measures 42.0  mmHg. Aortic valve area, by VTI measures 0.90 cm.  6. The inferior vena cava is normal in size with greater than 50% respiratory variability, suggesting right atrial pressure of 3 mmHg. Comparison(s): No significant change from prior study. Compared to 06/02/2018, atrial fibrillation is new. Otherwise no significant change in the echocardiogram,  previously similar gradients were obtained. Previous aortic valve area 1.3 cm. FINDINGS  Left Ventricle: Left ventricular ejection fraction, by estimation, is 55 to 60%. The left ventricle has normal function. The left ventricle has no regional wall motion abnormalities. Definity contrast agent was given IV to delineate the left ventricular  endocardial borders. The left ventricular internal cavity size was normal in size. There is mild left ventricular hypertrophy. Left ventricular diastolic parameters are indeterminate. Right Ventricle: The right ventricular size is not well visualized. Right vetricular wall thickness was not well visualized. Right ventricular systolic function is normal. Left Atrium: Left atrial size was moderately dilated. Right Atrium: Right atrial size was not well visualized. Pericardium: There is no evidence of pericardial effusion. Mitral Valve: The mitral valve was not well visualized. Trivial mitral valve regurgitation. Tricuspid Valve: The tricuspid valve is not well visualized. Tricuspid valve regurgitation is trivial. Aortic Valve: The aortic valve is calcified. There is severe calcifcation of the aortic valve. There is moderate thickening of the aortic valve. There is mild aortic valve annular calcification. Aortic valve regurgitation is not visualized. Moderate aortic stenosis is present. Aortic valve mean gradient measures 23.5 mmHg. Aortic valve peak gradient measures 42.0 mmHg. Aortic valve area, by VTI measures 0.90 cm. Pulmonic Valve: The pulmonic valve was not well visualized. Pulmonic valve regurgitation is not visualized. Aorta: The aortic root is normal in size and structure. Venous: The inferior vena cava was not well visualized. The inferior vena cava is normal in size with greater than 50% respiratory variability, suggesting right atrial pressure of 3 mmHg. IAS/Shunts: The interatrial septum was not well visualized.  LEFT VENTRICLE PLAX 2D LVIDd:         4.98 cm LVIDs:          4.15 cm LV PW:         1.23 cm LV IVS:        1.16 cm LVOT diam:     2.10 cm LV SV:         59 LV SV Index:   31 LVOT Area:     3.46 cm  IVC IVC diam: 1.71 cm LEFT ATRIUM         Index LA diam:    4.00 cm 2.07 cm/m  AORTIC VALVE AV Area (Vmax):    0.88 cm AV Area (Vmean):   0.91 cm AV Area (VTI):     0.90 cm AV Vmax:           324.00 cm/s AV Vmean:          223.500 cm/s AV VTI:            0.658 m AV Peak Grad:      42.0 mmHg AV Mean Grad:      23.5 mmHg LVOT Vmax:         82.10 cm/s LVOT Vmean:        58.600 cm/s LVOT VTI:          0.170 m LVOT/AV VTI  ratio: 0.26 MITRAL VALVE MV Area (PHT): 3.72 cm    SHUNTS MV Decel Time: 204 msec    Systemic VTI:  0.17 m MV E velocity: 94.50 cm/s  Systemic Diam: 2.10 cm MV A velocity: 50.00 cm/s MV E/A ratio:  1.89 Yates Decamp MD Electronically signed by Yates Decamp MD Signature Date/Time: 06/19/2020/4:06:04 PM    Final     Lab Data:  CBC: Recent Labs  Lab 07/12/2020 0030 12-Jul-2020 0550 July 12, 2020 1124 06/11/2020 0251 06/19/20 0220 06/20/20 0220  WBC 6.5 5.2  --  6.0 5.7 6.7  NEUTROABS 4.4  --   --   --   --   --   HGB 7.2* 7.6* 8.2* 10.0* 10.8* 10.5*  HCT 23.2* 24.6*  --  31.3* 33.4* 32.7*  MCV 97.9 98.0  --  93.2 94.1 94.0  PLT 207 165  --  188 180 188   Basic Metabolic Panel: Recent Labs  Lab 07/12/2020 0550 07/03/2020 0457 06/09/2020 0251 06/19/20 0220 06/20/20 0220  NA 136 138 139 139 139  K 4.7 4.6 3.8 4.0 3.9  CL 104 103 98 94* 93*  CO2 23 27 31  32 33*  GLUCOSE 96 96 94 97 113*  BUN 51* 40* 34* 32* 33*  CREATININE 1.90* 1.65* 1.54* 1.44* 1.43*  CALCIUM 8.4* 9.3 9.2 9.1 9.0   GFR: Estimated Creatinine Clearance: 43.9 mL/min (A) (by C-G formula based on SCr of 1.43 mg/dL (H)). Liver Function Tests: Recent Labs  Lab 2020/07/12 0030 07/04/2020 0457  AST 18 17  ALT 12 11  ALKPHOS 38 39  BILITOT 0.9 0.8  PROT 7.3 7.3  ALBUMIN 3.8 3.7   No results for input(s): LIPASE, AMYLASE in the last 168 hours. Recent Labs  Lab 07/08/2020 1425   AMMONIA 14   Coagulation Profile: Recent Labs  Lab 2020-07-12 0027  INR 1.2   Cardiac Enzymes: No results for input(s): CKTOTAL, CKMB, CKMBINDEX, TROPONINI in the last 168 hours. BNP (last 3 results) No results for input(s): PROBNP in the last 8760 hours. HbA1C: No results for input(s): HGBA1C in the last 72 hours. CBG: Recent Labs  Lab 06/28/2020 0735 06/25/2020 1206 07/06/2020 1630 06/28/2020 2124 06/19/20 0759  GLUCAP 102* 82 93 120* 101*   Lipid Profile: No results for input(s): CHOL, HDL, LDLCALC, TRIG, CHOLHDL, LDLDIRECT in the last 72 hours. Thyroid Function Tests: No results for input(s): TSH, T4TOTAL, FREET4, T3FREE, THYROIDAB in the last 72 hours. Anemia Panel: No results for input(s): VITAMINB12, FOLATE, FERRITIN, TIBC, IRON, RETICCTPCT in the last 72 hours. Urine analysis:    Component Value Date/Time   COLORURINE AMBER (A) 2020-07-12 0550   APPEARANCEUR HAZY (A) 12-Jul-2020 0550   LABSPEC 1.020 12-Jul-2020 0550   PHURINE 5.0 July 12, 2020 0550   GLUCOSEU NEGATIVE 07/12/20 0550   HGBUR NEGATIVE Jul 12, 2020 0550   BILIRUBINUR NEGATIVE 07/12/20 0550   KETONESUR 5 (A) 2020-07-12 0550   PROTEINUR 30 (A) 2020/07/12 0550   NITRITE NEGATIVE 12-Jul-2020 0550   LEUKOCYTESUR NEGATIVE Jul 12, 2020 0550     Janica Eldred [name] [facility] 06/20/2020, 10:52 AM  Available via Epic secure chat 7am-7pm After 7 pm, please refer to night coverage provider listed on amion.

## 2020-06-20 NOTE — Progress Notes (Signed)
Problems  1.  New onset A. fib with RVR, not a candidate for anticoagulation. 2.  Coronary artery disease, present elevated serum troponin due to demand ischemia, do not suspect NSTEMI although EKG was markedly abnormal, probably related to A. fib with RVR. EKG 06/19/2020 revealing normal sinus rhythm without ischemia.  Recommendation: We will decrease amiodarone to 200 mg twice daily and can be discharged home on 200 mg once a day.  I will follow him up in the outpatient basis.   Yates Decamp, MD, [facility] 06/20/2020, 2:50 PM Office: [phone] Pager: [phone]

## 2020-06-21 LAB — CBC
HCT: 35 % — ABNORMAL LOW (ref 39.0–52.0)
Hemoglobin: 10.8 g/dL — ABNORMAL LOW (ref 13.0–17.0)
MCH: 29 pg (ref 26.0–34.0)
MCHC: 30.9 g/dL (ref 30.0–36.0)
MCV: 94.1 fL (ref 80.0–100.0)
Platelets: 220 10*3/uL (ref 150–400)
RBC: 3.72 MIL/uL — ABNORMAL LOW (ref 4.22–5.81)
RDW: 14.5 % (ref 11.5–15.5)
WBC: 8.1 10*3/uL (ref 4.0–10.5)
nRBC: 0 % (ref 0.0–0.2)

## 2020-06-21 LAB — BASIC METABOLIC PANEL
Anion gap: 13 (ref 5–15)
BUN: 32 mg/dL — ABNORMAL HIGH (ref 8–23)
CO2: 31 mmol/L (ref 22–32)
Calcium: 8.9 mg/dL (ref 8.9–10.3)
Chloride: 93 mmol/L — ABNORMAL LOW (ref 98–111)
Creatinine, Ser: 1.41 mg/dL — ABNORMAL HIGH (ref 0.61–1.24)
GFR, Estimated: 52 mL/min — ABNORMAL LOW (ref >60)
Glucose, Bld: 140 mg/dL — ABNORMAL HIGH (ref 70–99)
Potassium: 3.9 mmol/L (ref 3.5–5.1)
Sodium: 137 mmol/L (ref 135–145)

## 2020-06-21 LAB — TROPONIN I (HIGH SENSITIVITY): Troponin I (High Sensitivity): 50 ng/L — ABNORMAL HIGH (ref <18)

## 2020-06-21 MED ORDER — HYDROXYZINE HCL 50 MG/ML IM SOLN
25.0000 mg | Freq: Once | INTRAMUSCULAR | Status: AC | PRN
  Administered 2020-06-21: 25 mg via INTRAMUSCULAR
  Filled 2020-06-21: qty 0.5

## 2020-07-10 NOTE — Progress Notes (Signed)
Pt refusing scds

## 2020-07-10 NOTE — Progress Notes (Addendum)
Pt agitated. Pulled out 1 of 2 Ivs, removed tele 3x, removed oxygen x3. PRN xanax given. Spoke w Pt wife who is on hospice and pleasant. Wife states xanax is what pt takes at home (0.5 mg 1-4x a day) not ativan. At this point Im concerned neither is working very well. Pt compliant of CP, nitrox2 relief. Lab came for blood and awoke pt who complained of cp. Nasal cannula reapplied, vs taken. Awaiting troponin blood work result before giving more nitro per physician so as not to drop his bp. Per physician no acute cardiac infarction on ekg.

## 2020-07-10 NOTE — Progress Notes (Signed)
Heart rhythm change noted on monitor-v.fib. Entering room patient unresponsive and DNR. Physician notified. [facility] donor service call. Family notified. Patient cleaned for potential viewing by family. Caregiver Dorene Sorrow notified and is going to wife's home to tell her so as she is not alone.

## 2020-07-10 NOTE — Death Summary Note (Addendum)
DEATH SUMMARY   Patient Details  Name: [name] MRN: [id number] DOB: 04-Nov-1945  Admission/Discharge Information   Admit Date:  June 21, 2020  Date of Death: Date of Death: June 26, 2020  Time of Death: Time of Death: 0655  Length of Stay: 4  Referring Physician: [name], MD   Reason(s) for Hospitalization    Patient was a 75 year old male with history of hypertension, hyperlipidemia, CAD, obstructive sleep apnea presented with generalized weakness and falls.  Patient was noted to be somewhat confused, his friend reported that he has been generally weak and had falls for the last 4 days.  He had hit his head at least once but no loss of consciousness or syncopal episodes.  Hemoglobin was found to be 7 and significant drop from his previous readings.  Patient was sent to ED by PCP. Hemoglobin 7.2, was transfused 2 units packed RBCs.  GI was consulted and patient was admitted for further work-up.    Diagnoses  Preliminary cause of death: Acute on chronic respiratory failure (HCC) Secondary Diagnoses (including complications and co-morbidities):  Acute blood loss anemia, acute upper GI bleed secondary to erosive gastropathy and duodenopathy  atrial fibrillation with RVR, new onset NSTEMI secondary to demand ischemia from atrial fibrillation with RVR Acute on chronic diastolic CHF Acute metabolic encephalopathy History of coronary disease Acute on CKD stage IIIa COPD Obstructive sleep apnea Generalized debility  Brief Hospital Course (including significant findings, care, treatment, and services provided and events leading to death)    Acute upper GI bleed with underlying history of GERD, ABLA -Patient noted to have hemoglobin of 7.2 on admission, previously 13.2 on 06/26/2019 -Patient was placed on IV Protonix, transfused 2 units packed RBCs -GI was consulted, underwent endoscopy 3/10, normal esophagus, erosive gastropathy with no stigmata of bleeding, erythematous  duodenopathy. -Was not considered a candidate for undergoing colonoscopy due to cardiac and other medical issues      Acute on chronic respiratory failure with hypercapnia (HCC) with acute metabolic encephalopathy -Overnight on 3/9, patient was noted to be in fluid overload, lethargic, had received packed RBC transfusion.  ABG showed pH 7.2, PCO2 65.7.  Patient was placed on BiPAP.  Patient had a history of OSA and had declined wearing CPAP at home but was using 2 L O2 at night. -Patient was placed on BiPAP nightly.  Mental status did improve.   Atrial fibrillation with RVR, new onset, NSTEMI -On 3/11, patient was noted to be in rapid atrial fibrillation with RVR with heart rate in 130s -Initially placed on Cardizem drip.  Cardiology was consulted, seen by Dr. Jacinto Halim, placed on amiodarone and beta-blocker per cardiology conditions.  Heart rate improved - elevated troponins likely due to demand ischemia, A. fib with RVR.  Per cardiology, not a candidate for heparin drip due to recent GI bleed and no invasive interventions.    Acute on chronic diastolic CHF (congestive heart failure) (HCC) -Likely due to fluid overload and packed RBC transfusion.  BNP 1030, patient had a history of diastolic CHF, trended down with diuresis -Previous echo on 06/02/2018 had shown EF of 50 to 55%, normal systolic function, repeat echo pending -Initially placed on IV Lasix diuresis, was transitioned to oral Lasix   Acute metabolic encephalopathy -Possibly due to hypercapnia, OSA not on CPAP, Xanax.   -CT head on admission showed no intracranial bleed.  Repeat CT head showed no changes.    CAD (coronary artery disease) of artery bypass graft -No acute chest pain.  Patient was continued  on beta-blocker Zetia and statin   Acute on CKD stage 3a -Baseline creatinine 1.4.  Presented with creatinine of 2.2 on admission -Resolved    COPD (chronic obstructive pulmonary disease) (HCC), obstructive sleep  apnea - Likely had underlying emphysema due to remote smoking, had been diagnosed with OSA, not consistently using CPAP  Generalized weakness, falls  Patient passed on 2020-06-30 at 6:55 AM.   Pertinent Labs and Studies  Significant Diagnostic Studies CT HEAD WO CONTRAST  Result Date: 06/14/2020 CLINICAL DATA:  Mental status change.  Recent fall. EXAM: CT HEAD WITHOUT CONTRAST TECHNIQUE: Contiguous axial images were obtained from the base of the skull through the vertex without intravenous contrast. COMPARISON:  CT head 06/27/2020 FINDINGS: Brain: Generalized atrophy. Moderate white matter changes most consistent with chronic ischemia. 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 Negative for acute infarct, hemorrhage, mass. Vascular: Negative for hyperdense vessel. Atherosclerotic calcification in the carotid and vertebral arteries bilaterally. Skull: Negative Sinuses/Orbits: Negative Other: None IMPRESSION: Atrophy and chronic microvascular ischemia. No acute abnormality no change from yesterday. Electronically Signed   By: Marlan Palau [name]   On: 06/12/2020 17:49   CT Head Wo Contrast  Result Date: 06/14/2020 CLINICAL DATA:  History of multiple falls today EXAM: CT HEAD WITHOUT CONTRAST TECHNIQUE: Contiguous axial images were obtained from the base of the skull through the vertex without intravenous contrast. COMPARISON:  03/29/2017 FINDINGS: Brain: No evidence of acute infarction, hemorrhage, hydrocephalus, extra-axial collection or mass lesion/mass effect. Chronic atrophic and ischemic changes are noted. Vascular: No hyperdense vessel or unexpected calcification. Skull: Normal. Negative for fracture or focal lesion. Sinuses/Orbits: No acute finding. Other: None. IMPRESSION: Chronic atrophic and ischemic changes without acute abnormality. Electronically Signed   By: Alcide Clever [name]   On: 07/05/2020 01:44   DG CHEST PORT 1 VIEW  Result Date:  06/09/2020 CLINICAL DATA:  Dyspnea EXAM: PORTABLE CHEST 1 VIEW COMPARISON:  06/20/2020 FINDINGS: Postop CABG. Heart size upper normal. Vascular congestion and bilateral airspace disease likely due to edema. Small right pleural effusion and right lower lobe atelectasis unchanged. Improved aeration in the left lung base. IMPRESSION: Congestive heart failure. Mild edema and small right effusion. Improved aeration in the left lung base otherwise little interval change from earlier today. Electronically Signed   By: Marlan Palau [name]   On: 06/25/2020 13:55   DG CHEST PORT 1 VIEW  Result Date: 06/28/2020 CLINICAL DATA:  Dyspnea EXAM: PORTABLE CHEST 1 VIEW COMPARISON:  01/21/2011 FINDINGS: The lungs are symmetrically well expanded. Moderate right pleural effusion. Bibasilar pulmonary infiltrates are present most in keeping with mild interstitial pulmonary edema. No pneumothorax. Coronary artery bypass grafting has been performed. Cardiac size is within normal limits. No acute bone abnormality. IMPRESSION: Mild cardiogenic failure.  Moderate right pleural effusion. Electronically Signed   By: Helyn Numbers MD   On: 06/25/2020 01:44   ECHOCARDIOGRAM COMPLETE  Result Date: 06/19/2020    ECHOCARDIOGRAM REPORT   Patient Name:   [name] Date of Exam: 06/19/2020 Medical Rec #:  [id number]     Height:       69.0 in Accession #:    [id number]    Weight:       170.0 lb Date of Birth:  01-06-1946      BSA:          1.928 m Patient Age:    74 years      BP:  94/48 mmHg Patient Gender: M             HR:           106 bpm. Exam Location:  Inpatient Procedure: 2D Echo, Cardiac Doppler, Color Doppler and Intracardiac            Opacification Agent Indications:     I50.22 Chronic systolic (congestive) heart failure  History:         Patient has prior history of Echocardiogram examinations, most                  recent 06/02/2018. Previous Myocardial Infarction and CAD,                  Stroke; Risk Factors:Sleep Apnea,  Hypertension and Diabetes.                  CKD.  Sonographer:     Elmarie Shiley Dance Referring Phys:  [id number] K Taryn Shellhammer Diagnosing Phys: Yates Decamp MD IMPRESSIONS  1. Left ventricular ejection fraction, by estimation, is 55 to 60%. The left ventricle has normal function. The left ventricle has no regional wall motion abnormalities. There is mild left ventricular hypertrophy. Left ventricular diastolic parameters are indeterminate.  2. Right ventricular systolic function is normal. The right ventricular size is not well visualized.  3. Left atrial size was moderately dilated.  4. The mitral valve was not well visualized. Trivial mitral valve regurgitation.  5. The aortic valve is calcified. There is severe calcifcation of the aortic valve. There is moderate thickening of the aortic valve. Moderate aortic valve stenosis. Aortic valve mean gradient measures 23.5 mmHg. Aortic valve peak gradient measures 42.0  mmHg. Aortic valve area, by VTI measures 0.90 cm.  6. The inferior vena cava is normal in size with greater than 50% respiratory variability, suggesting right atrial pressure of 3 mmHg. Comparison(s): No significant change from prior study. Compared to 06/02/2018, atrial fibrillation is new. Otherwise no significant change in the echocardiogram, previously similar gradients were obtained. Previous aortic valve area 1.3 cm. FINDINGS  Left Ventricle: Left ventricular ejection fraction, by estimation, is 55 to 60%. The left ventricle has normal function. The left ventricle has no regional wall motion abnormalities. Definity contrast agent was given IV to delineate the left ventricular  endocardial borders. The left ventricular internal cavity size was normal in size. There is mild left ventricular hypertrophy. Left ventricular diastolic parameters are indeterminate. Right Ventricle: The right ventricular size is not well visualized. Right vetricular wall thickness was not well visualized. Right ventricular systolic  function is normal. Left Atrium: Left atrial size was moderately dilated. Right Atrium: Right atrial size was not well visualized. Pericardium: There is no evidence of pericardial effusion. Mitral Valve: The mitral valve was not well visualized. Trivial mitral valve regurgitation. Tricuspid Valve: The tricuspid valve is not well visualized. Tricuspid valve regurgitation is trivial. Aortic Valve: The aortic valve is calcified. There is severe calcifcation of the aortic valve. There is moderate thickening of the aortic valve. There is mild aortic valve annular calcification. Aortic valve regurgitation is not visualized. Moderate aortic stenosis is present. Aortic valve mean gradient measures 23.5 mmHg. Aortic valve peak gradient measures 42.0 mmHg. Aortic valve area, by VTI measures 0.90 cm. Pulmonic Valve: The pulmonic valve was not well visualized. Pulmonic valve regurgitation is not visualized. Aorta: The aortic root is normal in size and structure. Venous: The inferior vena cava was not well visualized. The inferior vena cava is normal in  size with greater than 50% respiratory variability, suggesting right atrial pressure of 3 mmHg. IAS/Shunts: The interatrial septum was not well visualized.  LEFT VENTRICLE PLAX 2D LVIDd:         4.98 cm LVIDs:         4.15 cm LV PW:         1.23 cm LV IVS:        1.16 cm LVOT diam:     2.10 cm LV SV:         59 LV SV Index:   31 LVOT Area:     3.46 cm  IVC IVC diam: 1.71 cm LEFT ATRIUM         Index LA diam:    4.00 cm 2.07 cm/m  AORTIC VALVE AV Area (Vmax):    0.88 cm AV Area (Vmean):   0.91 cm AV Area (VTI):     0.90 cm AV Vmax:           324.00 cm/s AV Vmean:          223.500 cm/s AV VTI:            0.658 m AV Peak Grad:      42.0 mmHg AV Mean Grad:      23.5 mmHg LVOT Vmax:         82.10 cm/s LVOT Vmean:        58.600 cm/s LVOT VTI:          0.170 m LVOT/AV VTI ratio: 0.26 MITRAL VALVE MV Area (PHT): 3.72 cm    SHUNTS MV Decel Time: 204 msec    Systemic VTI:  0.17 m MV  E velocity: 94.50 cm/s  Systemic Diam: 2.10 cm MV A velocity: 50.00 cm/s MV E/A ratio:  1.89 Yates DecampJay Ganji MD Electronically signed by Yates DecampJay Ganji MD Signature Date/Time: 06/19/2020/4:06:04 PM    Final     Microbiology Recent Results (from the past 240 hour(s))  SARS CORONAVIRUS 2 (TAT 6-24 HRS) Nasopharyngeal Nasopharyngeal Swab     Status: None   Collection Time: 07/06/2020  1:40 AM   Specimen: Nasopharyngeal Swab  Result Value Ref Range Status   SARS Coronavirus 2 NEGATIVE NEGATIVE Final    Comment: (NOTE) SARS-CoV-2 target nucleic acids are NOT DETECTED.  The SARS-CoV-2 RNA is generally detectable in upper and lower respiratory specimens during the acute phase of infection. Negative results do not preclude SARS-CoV-2 infection, do not rule out co-infections with other pathogens, and should not be used as the sole basis for treatment or other patient management decisions. Negative results must be combined with clinical observations, patient history, and epidemiological information. The expected result is Negative.  Fact Sheet for Patients: HairSlick.nohttps://www.fda.gov/media/138098/download  Fact Sheet for Healthcare Providers: quierodirigir.comhttps://www.fda.gov/media/138095/download  This test is not yet approved or cleared by the [place] FDA and  has been authorized for detection and/or diagnosis of SARS-CoV-2 by FDA under an Emergency Use Authorization (EUA). This EUA will remain  in effect (meaning this test can be used) for the duration of the COVID-19 declaration under Se ction 564(b)(1) of the Act, 21 U.S.C. section 360bbb-3(b)(1), unless the authorization is terminated or revoked sooner.  Performed at [facility], [address] [place], [place] [address]   MRSA PCR Screening     Status: None   Collection Time: 06/26/2020  4:03 PM   Specimen: Nasopharyngeal  Result Value Ref Range Status   MRSA by PCR NEGATIVE NEGATIVE Final    Comment:  The GeneXpert MRSA Assay (FDA approved  for NASAL specimens only), is one component of a comprehensive MRSA colonization surveillance program. It is not intended to diagnose MRSA infection nor to guide or monitor treatment for MRSA infections. Performed at [facility], [address], [place], [place] [address]     Lab Basic Metabolic Panel: Recent Labs  Lab 06/30/2020 0457 06/10/2020 0251 06/19/20 0220 06/20/20 0220 06/22/2020 0055  NA 138 139 139 139 137  K 4.6 3.8 4.0 3.9 3.9  CL 103 98 94* 93* 93*  CO2 27 31 32 33* 31  GLUCOSE 96 94 97 113* 140*  BUN 40* 34* 32* 33* 32*  CREATININE 1.65* 1.54* 1.44* 1.43* 1.41*  CALCIUM 9.3 9.2 9.1 9.0 8.9   Liver Function Tests: Recent Labs  Lab 07/05/20 0030 06/09/2020 0457  AST 18 17  ALT 12 11  ALKPHOS 38 39  BILITOT 0.9 0.8  PROT 7.3 7.3  ALBUMIN 3.8 3.7   No results for input(s): LIPASE, AMYLASE in the last 168 hours. Recent Labs  Lab 06/23/2020 1425  AMMONIA 14   CBC: Recent Labs  Lab 2020-07-05 0030 07-05-2020 0550 Jul 05, 2020 1124 07/05/2020 0251 06/19/20 0220 06/20/20 0220 07/04/2020 0055  WBC 6.5 5.2  --  6.0 5.7 6.7 8.1  NEUTROABS 4.4  --   --   --   --   --   --   HGB 7.2* 7.6* 8.2* 10.0* 10.8* 10.5* 10.8*  HCT 23.2* 24.6*  --  31.3* 33.4* 32.7* 35.0*  MCV 97.9 98.0  --  93.2 94.1 94.0 94.1  PLT 207 165  --  188 180 188 220   Cardiac Enzymes: No results for input(s): CKTOTAL, CKMB, CKMBINDEX, TROPONINI in the last 168 hours. Sepsis Labs: Recent Labs  Lab 06/25/2020 0251 06/19/20 0220 06/20/20 0220 07/06/2020 0055  WBC 6.0 5.7 6.7 8.1    Procedures/Operations     Jillian Pianka 06/19/2020, 9:35 AM

## 2020-07-10 NOTE — Progress Notes (Signed)
I spoke to Mrs. [name] Cabell this morning after I found out that patient had passed with cardiac arrest.  Patient and also his wife's wish was to be DNR.  I offered her my condolences and also my prayers.  Appreciate Dr. Herma Carson who took took time to keep patient's wife informed at all times during this admission.  Mrs. 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   Yates Decamp, MD, [facility] 06/17/2020, 10:25 AM Office: [phone] Pager: [phone]

## 2020-07-10 NOTE — Progress Notes (Signed)
Pt instructed soft mitts would be applied if he continued pulling devices off (iv, tele, oxygen, primofit, scds). Pt continued through out shift. Soft mitts applied. Pt educated if he stops pulling at devices mitts can be removed. Pt states "I cant promise that".

## 2020-07-10 DEATH — deceased

## 2020-07-20 ENCOUNTER — Ambulatory Visit: Payer: Medicare PPO | Admitting: Neurology

## 2020-12-02 ENCOUNTER — Ambulatory Visit: Payer: Medicare PPO | Admitting: Cardiology

## 2021-05-25 IMAGING — DX DG CHEST 1V PORT
1 series · 1 of 1 positions shown · non-contrast
Comparison: 06/17/2020

CLINICAL DATA: Dyspnea

EXAM:
PORTABLE CHEST 1 VIEW

[chest ap]
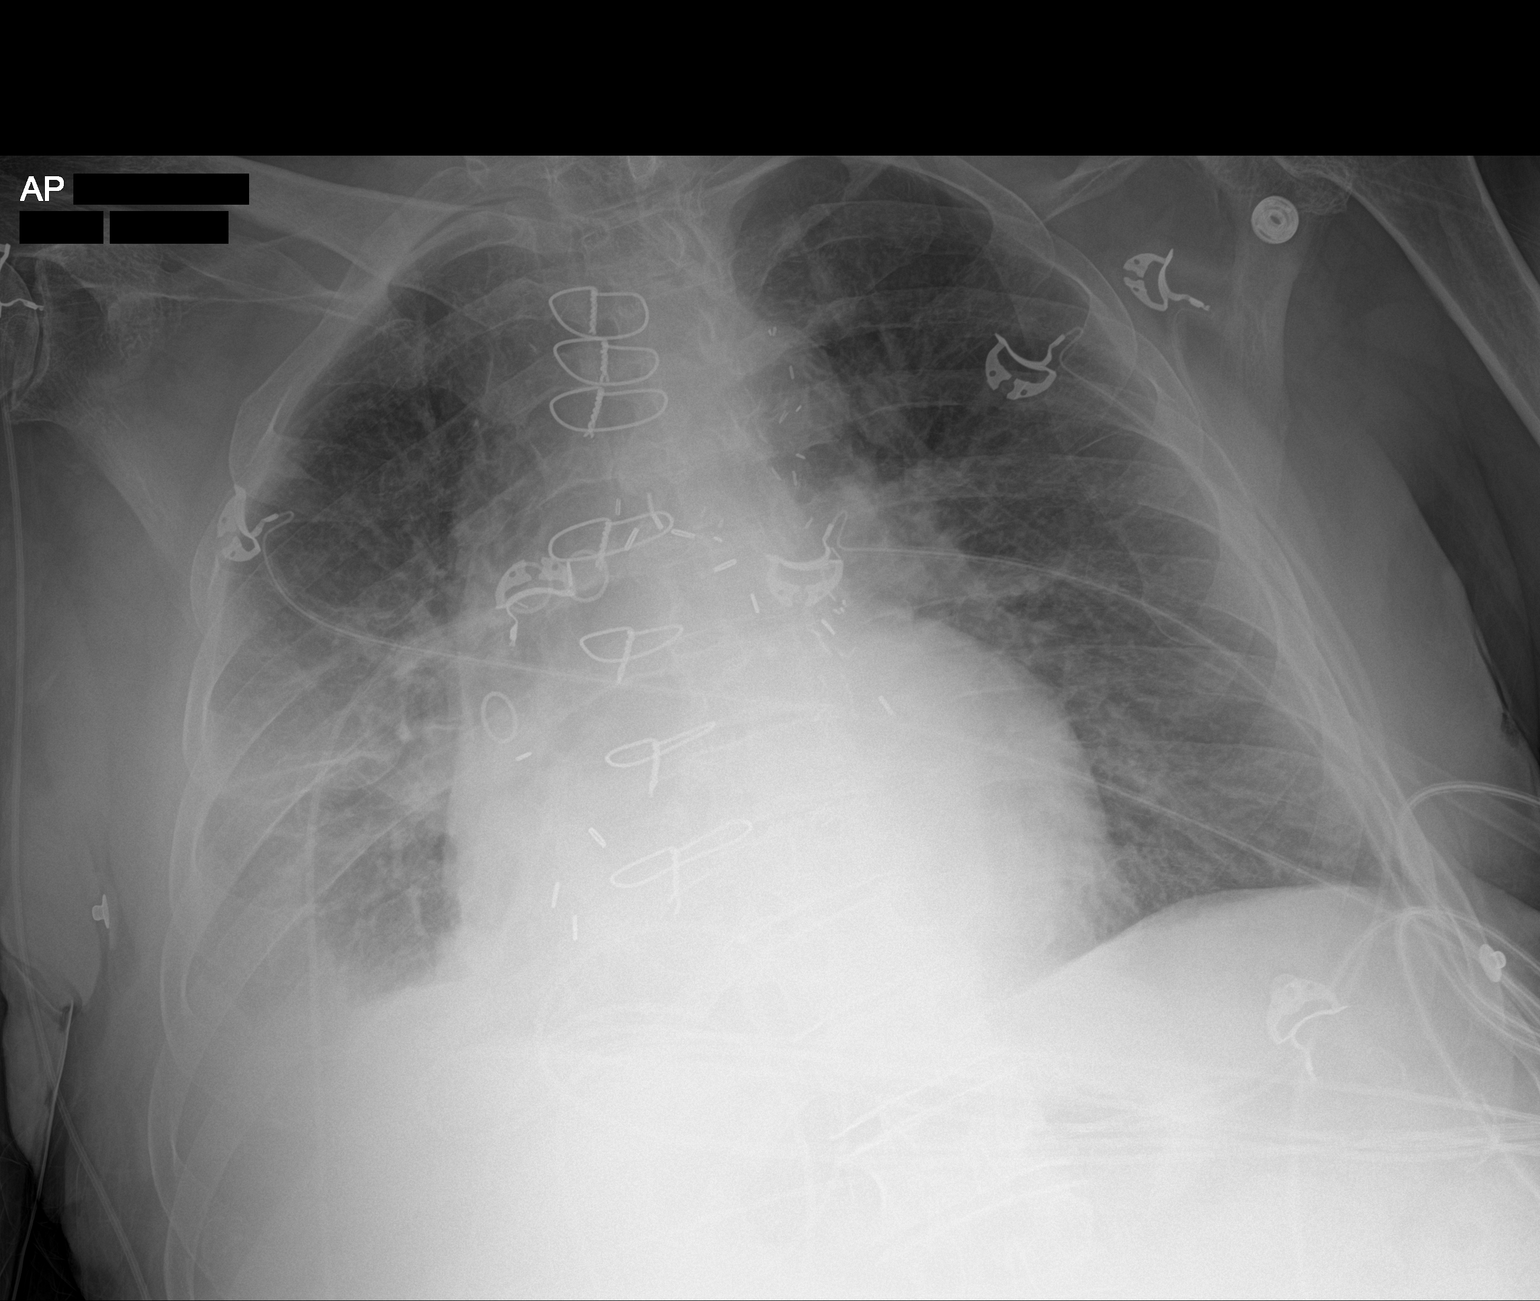

[1 of 1 positions shown; findings below may reference images not displayed]

FINDINGS: Postop CABG. Heart size upper normal. Vascular congestion and
bilateral airspace disease likely due to edema. Small right pleural
effusion and right lower lobe atelectasis unchanged. Improved
aeration in the left lung base.
IMPRESSION: Congestive heart failure. Mild edema and small right effusion.
Improved aeration in the left lung base otherwise little interval
change from earlier today.
